# Patient Record
Sex: Male | Born: 1950 | Race: Black or African American | Hispanic: No | Smoking: Never smoker
Health system: Southern US, Community
[De-identification: ages and names within clinical notes are randomized; demographics above are authoritative.]

## PROBLEM LIST (undated history)

## (undated) DIAGNOSIS — I609 Nontraumatic subarachnoid hemorrhage, unspecified: Secondary | ICD-10-CM

## (undated) DIAGNOSIS — E119 Type 2 diabetes mellitus without complications: Secondary | ICD-10-CM

## (undated) DIAGNOSIS — I5022 Chronic systolic (congestive) heart failure: Secondary | ICD-10-CM

## (undated) DIAGNOSIS — I5032 Chronic diastolic (congestive) heart failure: Secondary | ICD-10-CM

## (undated) DIAGNOSIS — I420 Dilated cardiomyopathy: Principal | ICD-10-CM

## (undated) DIAGNOSIS — M1990 Unspecified osteoarthritis, unspecified site: Secondary | ICD-10-CM

## (undated) DIAGNOSIS — I10 Essential (primary) hypertension: Secondary | ICD-10-CM

## (undated) DIAGNOSIS — E785 Hyperlipidemia, unspecified: Secondary | ICD-10-CM

## (undated) DIAGNOSIS — I2510 Atherosclerotic heart disease of native coronary artery without angina pectoris: Secondary | ICD-10-CM

## (undated) HISTORY — DX: Chronic systolic (congestive) heart failure: I50.22

## (undated) HISTORY — DX: Dilated cardiomyopathy: I42.0

## (undated) HISTORY — DX: Nontraumatic subarachnoid hemorrhage, unspecified: I60.9

## (undated) HISTORY — DX: Hyperlipidemia, unspecified: E78.5

## (undated) HISTORY — DX: Type 2 diabetes mellitus without complications: E11.9

## (undated) HISTORY — DX: Chronic diastolic (congestive) heart failure: I50.32

## (undated) HISTORY — DX: Essential (primary) hypertension: I10

## (undated) HISTORY — PX: CORONARY ANGIOPLASTY: SHX604

---
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
## 1996-04-08 DIAGNOSIS — I609 Nontraumatic subarachnoid hemorrhage, unspecified: Secondary | ICD-10-CM

## 1996-04-08 HISTORY — DX: Nontraumatic subarachnoid hemorrhage, unspecified: I60.9

## 1996-04-08 HISTORY — PX: CSF SHUNT: SHX92

## 1996-04-08 HISTORY — PX: BRAIN SURGERY: SHX531

## 1998-09-11 ENCOUNTER — Encounter: Payer: Self-pay | Admitting: Internal Medicine

## 1998-09-11 ENCOUNTER — Ambulatory Visit (HOSPITAL_COMMUNITY): Admission: RE | Admit: 1998-09-11 | Discharge: 1998-09-11 | Payer: Self-pay | Admitting: Internal Medicine

## 2004-11-05 ENCOUNTER — Ambulatory Visit: Payer: Self-pay | Admitting: Internal Medicine

## 2005-02-19 ENCOUNTER — Ambulatory Visit: Payer: Self-pay | Admitting: Internal Medicine

## 2005-03-11 ENCOUNTER — Ambulatory Visit: Payer: Self-pay | Admitting: Gastroenterology

## 2005-04-22 ENCOUNTER — Ambulatory Visit: Payer: Self-pay | Admitting: Gastroenterology

## 2005-04-29 ENCOUNTER — Ambulatory Visit: Payer: Self-pay | Admitting: Gastroenterology

## 2005-04-29 ENCOUNTER — Encounter (INDEPENDENT_AMBULATORY_CARE_PROVIDER_SITE_OTHER): Payer: Self-pay | Admitting: *Deleted

## 2005-08-21 ENCOUNTER — Ambulatory Visit: Payer: Self-pay | Admitting: Internal Medicine

## 2005-10-21 ENCOUNTER — Ambulatory Visit: Payer: Self-pay | Admitting: Internal Medicine

## 2007-01-12 ENCOUNTER — Ambulatory Visit: Payer: Self-pay | Admitting: Internal Medicine

## 2007-10-28 ENCOUNTER — Telehealth: Payer: Self-pay | Admitting: Internal Medicine

## 2007-10-28 ENCOUNTER — Ambulatory Visit: Payer: Self-pay | Admitting: Internal Medicine

## 2007-10-28 DIAGNOSIS — E785 Hyperlipidemia, unspecified: Secondary | ICD-10-CM

## 2007-10-28 DIAGNOSIS — E119 Type 2 diabetes mellitus without complications: Secondary | ICD-10-CM | POA: Insufficient documentation

## 2007-10-28 DIAGNOSIS — M7520 Bicipital tendinitis, unspecified shoulder: Secondary | ICD-10-CM | POA: Insufficient documentation

## 2007-10-28 DIAGNOSIS — I10 Essential (primary) hypertension: Secondary | ICD-10-CM

## 2007-10-28 DIAGNOSIS — R079 Chest pain, unspecified: Secondary | ICD-10-CM | POA: Insufficient documentation

## 2007-10-29 ENCOUNTER — Encounter: Payer: Self-pay | Admitting: Internal Medicine

## 2007-12-30 ENCOUNTER — Encounter: Payer: Self-pay | Admitting: Internal Medicine

## 2008-07-13 ENCOUNTER — Ambulatory Visit: Payer: Self-pay | Admitting: Internal Medicine

## 2008-07-13 ENCOUNTER — Inpatient Hospital Stay (HOSPITAL_COMMUNITY): Admission: AD | Admit: 2008-07-13 | Discharge: 2008-07-14 | Payer: Self-pay | Admitting: Internal Medicine

## 2008-07-13 ENCOUNTER — Encounter: Payer: Self-pay | Admitting: Internal Medicine

## 2008-07-13 ENCOUNTER — Ambulatory Visit: Payer: Self-pay | Admitting: Vascular Surgery

## 2008-07-13 DIAGNOSIS — R209 Unspecified disturbances of skin sensation: Secondary | ICD-10-CM

## 2008-07-26 ENCOUNTER — Ambulatory Visit: Payer: Self-pay | Admitting: Internal Medicine

## 2008-08-05 ENCOUNTER — Telehealth: Payer: Self-pay | Admitting: Internal Medicine

## 2008-11-03 ENCOUNTER — Encounter: Payer: Self-pay | Admitting: Internal Medicine

## 2009-09-17 ENCOUNTER — Encounter: Payer: Self-pay | Admitting: Internal Medicine

## 2009-11-03 ENCOUNTER — Telehealth: Payer: Self-pay | Admitting: Internal Medicine

## 2009-11-17 ENCOUNTER — Ambulatory Visit: Payer: Self-pay | Admitting: Internal Medicine

## 2009-11-17 LAB — CONVERTED CEMR LAB
ALT: 43 units/L (ref 0–53)
AST: 29 units/L (ref 0–37)
Albumin: 4.7 g/dL (ref 3.5–5.2)
Alkaline Phosphatase: 56 units/L (ref 39–117)
BUN: 11 mg/dL (ref 6–23)
Bilirubin, Direct: 0.1 mg/dL (ref 0.0–0.3)
CO2: 30 meq/L (ref 19–32)
Calcium: 9.9 mg/dL (ref 8.4–10.5)
Chloride: 103 meq/L (ref 96–112)
Cholesterol: 177 mg/dL (ref 0–200)
Creatinine, Ser: 0.9 mg/dL (ref 0.4–1.5)
Folate: 17.7 ng/mL
GFR calc non Af Amer: 107.58 mL/min (ref >60)
Glucose, Bld: 240 mg/dL — ABNORMAL HIGH (ref 70–99)
HDL: 33.6 mg/dL — ABNORMAL LOW (ref >39.00)
Hgb A1c MFr Bld: 9.9 % — ABNORMAL HIGH (ref 4.6–6.5)
LDL Cholesterol: 109 mg/dL — ABNORMAL HIGH (ref 0–99)
Potassium: 4.2 meq/L (ref 3.5–5.1)
Sodium: 141 meq/L (ref 135–145)
Total Bilirubin: 0.7 mg/dL (ref 0.3–1.2)
Total CHOL/HDL Ratio: 5
Total Protein: 7.7 g/dL (ref 6.0–8.3)
Triglycerides: 171 mg/dL — ABNORMAL HIGH (ref 0.0–149.0)
VLDL: 34.2 mg/dL (ref 0.0–40.0)
Vitamin B-12: 739 pg/mL (ref 211–911)

## 2009-11-20 ENCOUNTER — Telehealth: Payer: Self-pay | Admitting: Internal Medicine

## 2009-11-22 ENCOUNTER — Encounter: Payer: Self-pay | Admitting: Internal Medicine

## 2010-01-11 ENCOUNTER — Telehealth (INDEPENDENT_AMBULATORY_CARE_PROVIDER_SITE_OTHER): Payer: Self-pay | Admitting: *Deleted

## 2010-03-20 ENCOUNTER — Encounter: Payer: Self-pay | Admitting: Gastroenterology

## 2010-03-29 ENCOUNTER — Ambulatory Visit: Payer: Self-pay | Admitting: Internal Medicine

## 2010-05-09 NOTE — Progress Notes (Signed)
Summary: REFILS  Phone Note Call from Patient Call back at [facility] Phone [phone]   Caller: Patient Summary of Call: PT IS REQUESTING REFILLS LISINOPRIL 20 AND VYTORIN 10-80.  THE PHARMACY REFILLED THE METFORMIN.  HE MADE AN APPT FOR AUG 12 AT 2:20 FOR A YEARLY MEDICARE ROV. Initial call taken by: Hilarie Fredrickson,  November 03, 2009 11:01 AM    Prescriptions: VYTORIN 10-80 MG  TABS (EZETIMIBE-SIMVASTATIN) 1 qd  #30 Tablet x 0   Entered by:   Bill Salinas CMA   Authorized by:   Jacques Navy MD   Signed by:   Bill Salinas CMA on 11/03/2009   Method used:   Electronically to        [facility] [phone]* (retail)       [address]       [place], [place]  [address]       Ph: [phone] or [phone]       Fax: [phone]   RxID:   [id number] LISINOPRIL 20 MG  TABS (LISINOPRIL) Take 1 tablet by mouth two times a day  #60 Tablet x 0   Entered by:   Ami Bullins CMA   Authorized by:   Jacques Navy MD   Signed by:   Bill Salinas CMA on 11/03/2009   Method used:   Electronically to        [facility] [phone]* (retail)       [address]       [place], [place]  [address]       Ph: [phone] or [phone]       Fax: [phone]   RxID:   [id number]

## 2010-05-09 NOTE — Progress Notes (Signed)
    Preventive Care Screening  Colonoscopy:    Date:  04/29/2005    Next Due:  05/2010    Results:  Adenomatous Polyp    Immunization History:  Influenza Immunization History:    Influenza:  historical (01/12/2007)

## 2010-05-09 NOTE — Medication Information (Signed)
Summary: Diabetes Testing [facility]  [facility]   Imported By: Sherian Rein 09/25/2009 09:01:51  _____________________________________________________________________  External Attachment:    Type:   Image     Comment:   External Document

## 2010-05-09 NOTE — Assessment & Plan Note (Signed)
Summary: YEARLY FU / REFILLS/MEDICARE/ LABS NEXT DAY/ NWS   Vital Signs:  Patient profile:   60 year old male Height:      69 inches (175.26 cm) Weight:      207.25 pounds (94.20 kg) BMI:     30.72 O2 Sat:      98 % on Room air Temp:     97.2 degrees F (36.22 degrees C) oral Pulse rate:   85 / minute BP sitting:   140 / 94  (left arm) Cuff size:   regular  Vitals Entered By: Lucious Groves CMA (November 17, 2009 2:44 PM)  O2 Flow:  Room air CC: Yearly f/u and med refills./kb Is Patient Diabetic? Yes Pain Assessment Patient in pain? no        Primary Care Provider:  Jacques Navy MD  CC:  Yearly f/u and med refills./kb.  History of Present Illness: Patient presents for routine medical follow-up. In the interval he has been doing well: no major illness, no injury no surgery. He does report that he has had some tingling in the right distal LE for several months.   Preventive Screening-Counseling & Management  Alcohol-Tobacco     Alcohol drinks/day: 0     Smoking Status: never  Caffeine-Diet-Exercise     Diet Comments: healthy diet      Does Patient Exercise: yes  Hep-HIV-STD-Contraception     STD Risk: risk noted     STD Risk Counseling: to avoid increased STD risk  Safety-Violence-Falls     Seat Belt Use: yes     Smoke Detectors: yes  Current Medications (verified): 1)  Vytorin 10-80 Mg  Tabs (Ezetimibe-Simvastatin) .Marland Kitchen.. 1 Qd 2)  Lisinopril 20 Mg  Tabs (Lisinopril) .... Take 1 Tablet By Mouth Two Times A Day 3)  Metformin Hcl 500 Mg  Tabs (Metformin Hcl) .Marland Kitchen.. 1 By Mouth Once Daily 4)  Aspirin 325 Mg Tabs (Aspirin) .... Take 1 Tab By Mouth Every Day 5)  Mvi .Marland Kitchen.. 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 He did live in [place].  Lives together with his son. Sexually active -  Never Smoked Alcohol use-no Does Patient Exercise:  yes STD Risk:  risk noted Seat Belt Use:  yes  Review of Systems  The patient denies anorexia, fever, weight loss, weight gain, vision loss, decreased hearing, chest pain, dyspnea on exertion, peripheral edema, abdominal pain, severe indigestion/heartburn, muscle weakness, difficulty walking, abnormal bleeding, and angioedema.         nocturia x 1  Physical Exam  General:  Well-developed,well-nourished,in no acute distress; alert,appropriate and cooperative throughout examination Head:  normocephalic, atraumatic, and no abnormalities observed.   Eyes:  vision grossly intact, pupils equal, pupils round, and corneas and lenses clear.   Ears:  External ear exam shows no significant lesions or deformities.  Otoscopic examination reveals clear canals, tympanic membranes are intact bilaterally without bulging, retraction, inflammation or discharge. Hearing is  grossly normal bilaterally. Nose:  no external deformity and no external erythema.   Mouth:  Oral mucosa and oropharynx without lesions or exudates.  Teeth in good repair. Neck:  supple, full ROM, no thyromegaly, and no carotid bruits.   Chest Wall:  No deformities, masses, tenderness or gynecomastia noted. Lungs:  Normal respiratory effort, chest expands symmetrically. Lungs are clear to auscultation, no crackles or wheezes. Heart:  Normal rate and regular rhythm. S1 and S2 normal without gallop, murmur, click, rub or other extra  sounds. Abdomen:  soft, non-tender, normal bowel sounds, no guarding, no rigidity, and no hepatomegaly.   Rectal:  No external abnormalities noted. Normal sphincter tone. No rectal masses or tenderness. Prostate:  Prostate gland firm and smooth, no enlargement, nodularity, tenderness, mass, asymmetry or induration. Msk:  normal ROM, no joint tenderness, no joint swelling, no joint warmth, and no joint instability.   Pulses:  2+ radial and DP Extremities:  No clubbing, cyanosis, edema, or deformity noted with normal full range of motion of all joints.    Diabetes Management Exam:    Foot Exam (with socks and/or shoes not present):       Sensory-Pinprick/Light touch:          Left medial foot (L-4): normal          Left dorsal foot (L-5): normal          Left lateral foot (S-1): normal          Right medial foot (L-4): normal          Right dorsal foot (L-5): normal          Right lateral foot (S-1): normal       Sensory-Monofilament:          Left foot: normal          Right foot: normal       Sensory-other: vibratory sensation       Inspection:          Left foot: normal          Right foot: normal       Nails:          Left foot: normal          Right foot: normal    Eye Exam:       Eye Exam done elsewhere          Date: 11/15/2009          Results: normal          Done by: Dr. Dione Booze   Impression & Recommendations:  Problem # 1:  HYPERTENSION (ICD-401.9)  His updated medication list for this problem includes:    Lisinopril 20 Mg Tabs (Lisinopril) .Marland Kitchen... Take 1 tablet by mouth two times a day  Orders: TLB-BMP (Basic Metabolic Panel-BMET) (80048-METABOL)  BP today: 140/94 Prior BP: 140/100 (07/26/2008)  Borderline control on lisinopril 20.  Plan - he is to monitor his blood pressure out of the office and report back. Goal is systolic of 130, diastolic of 80  Problem # 2:  DIABETES MELLITUS, TYPE II (ICD-250.00) Due for A1C with recommendations to follow.   His  updated medication list for this problem includes:    Lisinopril 20 Mg Tabs (Lisinopril) .Marland Kitchen... Take 1 tablet by mouth two times a day    Metformin Hcl 500 Mg Tabs (Metformin hcl) .Marland Kitchen... 1 by mouth once daily    Aspirin 325 Mg Tabs (Aspirin) .Marland Kitchen... Take 1 tab by mouth  every day  Orders: TLB-A1C / Hgb A1C (Glycohemoglobin) (83036-A1C)  Addendum - A1C 9.9%  Plan - follow-up OV  Problem # 3:  HYPERLIPIDEMIA (ICD-272.4) Due for labs with recommendations to follow.  His updated medication list for this problem includes:    Vytorin 10-80 Mg Tabs (Ezetimibe-simvastatin) .Marland Kitchen... 1 qd  Orders: TLB-Lipid Panel (80061-LIPID) TLB-Hepatic/Liver Function Pnl (80076-HEPATIC)  Addendum - LDL 109, close to goal of 401 or less. Will continue present medications with increased emphasis on healthy diet.   Problem # 4:  Preventive Health Care (ICD-V70.0) Unremarkable history and normal physical exam. His labs with elevated A!c but otherwise OK. He is current with tetnus. He is due to schedule colonoscopy. He needs pneumonia vaccine and should consider shingles vaccine. Will discuss at this return visit. He does need a return visit due to diabetes being out of control.   Complete Medication List: 1)  Vytorin 10-80 Mg Tabs (Ezetimibe-simvastatin) .Marland Kitchen.. 1 qd 2)  Lisinopril 20 Mg Tabs (Lisinopril) .... Take 1 tablet by mouth two times a day 3)  Metformin Hcl 500 Mg Tabs (Metformin hcl) .Marland Kitchen.. 1 by mouth once daily 4)  Aspirin 325 Mg Tabs (Aspirin) .... Take 1 tab by mouth every day 5)  Mvi  .Marland Kitchen.. 1 by mouth qd  Other Orders: TLB-B12 + Folate Pnl (82746_82607-B12/FOL) TD Toxoids IM 7 YR + (02725) Admin 1st Vaccine (36644)  Patient: Jesse Woods Note: All result statuses are Final unless otherwise noted.  Tests: (1) B12 + Folate Panel (B12/FOL)   Vitamin B12               739 pg/mL                   211-911   Folate                    17.7 ng/mL     Deficient  0.4 - 3.4 ng/mL     Indeterminate  3.4 - 5.4  ng/mL     Normal  >5.4 ng/mL  Tests: (2) BMP (METABOL)   Sodium                    141 mEq/L                   135-145   Potassium                 4.2 mEq/L                   3.5-5.1   Chloride                  103 mEq/L                   96-112   Carbon Dioxide            30 mEq/L                    19-32   Glucose              [H]  240 mg/dL                   03-47   BUN                       11 mg/dL  6-23   Creatinine                0.9 mg/dL                   5.2-8.4   Calcium                   9.9 mg/dL                   1.3-24.4   GFR                       107.58 mL/min               >60  Tests: (3) Hemoglobin A1C (A1C)   Hemoglobin A1C       [H]  9.9 %                       4.6-6.5     Glycemic Control Guidelines for People with Diabetes:     Non Diabetic:  <6%     Goal of Therapy: <7%     Additional Action Suggested:  >8%   Tests: (4) Lipid Panel (LIPID)   Cholesterol               177 mg/dL                   0-102     ATP III Classification            Desirable:  < 200 mg/dL                    Borderline High:  200 - 239 mg/dL               High:  > = 240 mg/dL   Triglycerides        [H]  171.0 mg/dL                 7.2-536.6     Normal:  <150 mg/dL     Borderline High:  440 - 199 mg/dL   HDL                  [L]  34.74 mg/dL                 >25.95   VLDL Cholesterol          34.2 mg/dL                  6.3-87.5   LDL Cholesterol      [H]  643 mg/dL                   3-29  CHO/HDL Ratio:  CHD Risk                             5                    Men          Women     1/2 Average Risk     3.4          3.3     Average Risk          5.0          4.4     2X Average Risk  9.6          7.1     3X Average Risk          15.0          11.0                           Tests: (5) Hepatic/Liver Function Panel (HEPATIC)   Total Bilirubin           0.7 mg/dL                   6.6-4.4   Direct Bilirubin          0.1 mg/dL                   0.3-4.7    Alkaline Phosphatase      56 U/L                      39-117   AST                       29 U/L                      0-37   ALT                       43 U/L                      0-53   Total Protein             7.7 g/dL                    4.2-5.9   Albumin                   4.7 g/dL                    5.6-3.8VFIEPPIRJJOAC: METFORMIN HCL 500 MG  TABS (METFORMIN HCL) 1 by mouth once daily  #30 x 12   Entered and Authorized by:   Jacques Navy MD   Signed by:   Jacques Navy MD on 11/17/2009   Method used:   Electronically to        [facility] [phone]* (retail)       [address]       [place], [place]  [address]       Ph: [phone] or [phone]       Fax: [phone]   RxID:   [id number] LISINOPRIL 20 MG  TABS (LISINOPRIL) Take 1 tablet by mouth two times a day  #60 Tablet x 12   Entered and Authorized by:   Jacques Navy MD   Signed by:   Jacques Navy MD on 11/17/2009   Method used:   Electronically to        [facility] [phone]* (retail)       [address]       [place], [place]  [address]       Ph: [phone] or [phone]       Fax: [phone]   RxID:   [id number] VYTORIN 10-80 MG  TABS (EZETIMIBE-SIMVASTATIN) 1 qd  #30 Tablet x 12  Entered and Authorized by:   Jacques Navy MD   Signed by:   Jacques Navy MD on 11/17/2009   Method used:   Electronically to        [facility] [phone]* (retail)       [address]       [place], [place]  [address]       Ph: [phone] or [phone]       Fax: [phone]   RxID:   [id number]    Immunizations Administered:  Tetanus Vaccine:    Vaccine Type: Td    Site: right deltoid    Mfr: GlaxoSmithKline    Dose: 0.5 ml    Route: IM    Given by: Lucious Groves CMA    Exp. Date: 10/06/2011    Lot #: [id number]    VIS given: 02/24/07 version given November 17, 2009.

## 2010-05-09 NOTE — Miscellaneous (Signed)
Summary: Flu Vaccination/[facility]  Flu Vaccination/[facility]   Imported By: Sherian Rein 12/01/2009 11:19:41  _____________________________________________________________________  External Attachment:    Type:   Image     Comment:   External Document

## 2010-05-09 NOTE — Progress Notes (Signed)
  Phone Note Other Incoming   Request: Send information Summary of Call: Request for records received from [facility] [employer] Request forwarded to [facility].

## 2010-05-10 NOTE — Assessment & Plan Note (Signed)
Summary: chills,throat scratchy,headache/lb   Vital Signs:  Patient profile:   59 year old male Height:      69 inches Weight:      207 pounds BMI:     30.68 O2 Sat:      97 % on Room air Temp:     97.8 degrees F oral Pulse rate:   89 / minute BP sitting:   138 / 88  (left arm) Cuff size:   regular  Vitals Entered By: Bill Salinas CMA (March 29, 2010 3:37 PM)  O2 Flow:  Room air CC: sore throat x about 1 week/ ab   Primary Care Provider:  Jacques Navy MD  CC:  sore throat x about 1 week/ ab.  History of Present Illness: patient has been sick sinc Dec 5th with cold symptoms which he treated with coricidin for cold. This helped and he was feelng better. He started feeling worse again 48 hrs ago. No fever, no chills, no cough. Sratchy throat, chilly in the chest area. No N/V/D.  He has been out and about. No known sick contact.   Current Medications (verified): 1)  Vytorin 10-80 Mg  Tabs (Ezetimibe-Simvastatin) .Marland Kitchen.. 1 Qd 2)  Lisinopril 20 Mg  Tabs (Lisinopril) .... Take 1 Tablet By Mouth Two Times A Day 3)  Metformin Hcl 500 Mg  Tabs (Metformin Hcl) .Marland Kitchen.. 1 By Mouth Once Daily 4)  Aspirin 325 Mg Tabs (Aspirin) .... Take 1 Tab By Mouth Every Day 5)  Mvi .Marland Kitchen.. 1 By Mouth Qd  Allergies (verified): No Known Drug Allergies  Past History:  Past Medical History: Last updated: 07/13/2008 Diabetes mellitus, type II Hyperlipidemia Hypertension  Past Surgical History: Last updated: 07/13/2008 R subarachn hemorrage "51 Dr Lovell Sheehan FH reviewed for relevance, SH/Risk Factors reviewed for relevance  Review of Systems  The patient denies anorexia, fever, weight loss, weight gain, chest pain, syncope, dyspnea on exertion, prolonged cough, headaches, abdominal pain, severe indigestion/heartburn, muscle weakness, difficulty walking, depression, and enlarged lymph nodes.    Physical Exam  General:  Well-developed,well-nourished,in no acute distress; alert,appropriate and  cooperative throughout examination Head:  Normocephalic and atraumatic without obvious abnormalities. No tenderness to percussion over frontal or maxillary sinus Eyes:  pupils equal and pupils round.  C&S clear Ears:  EACs/TMs normal Mouth:  Throat clear Neck:  supple, full ROM, and no thyromegaly.   Lungs:  normal respiratory effort, normal breath sounds, no crackles, and no wheezes.   Heart:  normal rate, regular rhythm, and no murmur.   Abdomen:  soft and normal bowel sounds.   Msk:  normal ROM, no joint tenderness, no joint swelling, and no joint warmth.   Neurologic:  alert & oriented X3, cranial nerves II-XII intact, and gait normal.   Skin:  turgor normal and color normal.   Psych:  normally interactive and good eye contact.     Impression & Recommendations:  Problem # 1:  VIRAL URI (ICD-465.9) no evidence of bacterial infection.  Plan - routine supportive care: vit c, hydrate, otc product of choice, APAP 1000mg  three times a day.          call for fever, shortness of breath, productive cough.  His updated medication list for this problem includes:    Aspirin 325 Mg Tabs (Aspirin) .Marland Kitchen... Take 1 tab by mouth every day  Complete Medication List: 1)  Vytorin 10-80 Mg Tabs (Ezetimibe-simvastatin) .Marland Kitchen.. 1 qd 2)  Lisinopril 20 Mg Tabs (Lisinopril) .... Take 1 tablet by mouth two times  a day 3)  Metformin Hcl 500 Mg Tabs (Metformin hcl) .Marland Kitchen.. 1 by mouth once daily 4)  Aspirin 325 Mg Tabs (Aspirin) .... Take 1 tab by mouth every day 5)  Mvi  .Marland Kitchen.. 1 by mouth qd   Orders Added: 1)  Est. Patient Level III [56213]

## 2010-05-10 NOTE — Letter (Signed)
Summary: Colonoscopy Letter  [facility]  [address]   [place], [place] [address]   Phone: [phone]  Fax: [phone]      March 20, 2010 MRN: [id number]   Jesse Woods [address] [place], [place]  [address]   Dear [name],   According to your medical record, it is time for you to schedule a Colonoscopy. The American Cancer Society recommends this procedure as a method to detect early colon cancer. Patients with a family history of colon cancer, or a personal history of colon polyps or inflammatory bowel disease are at increased risk.  This letter has been generated based on the recommendations made at the time of your procedure. If you feel that in your particular situation this may no longer apply, please contact our office.  Please call our office at [phone] to schedule this appointment or to update your records at your earliest convenience.  Thank you for cooperating with [place] to provide you with the very best care possible.   Sincerely,   Claudette Head, M.D.  [facility] [phone]

## 2010-07-18 LAB — COMPREHENSIVE METABOLIC PANEL
ALT: 19 U/L (ref 0–53)
Albumin: 4.1 g/dL (ref 3.5–5.2)
Alkaline Phosphatase: 53 U/L (ref 39–117)
Calcium: 9.2 mg/dL (ref 8.4–10.5)
Potassium: 4.1 mEq/L (ref 3.5–5.1)
Sodium: 138 mEq/L (ref 135–145)
Total Protein: 7.1 g/dL (ref 6.0–8.3)

## 2010-07-18 LAB — CBC
HCT: 42.1 % (ref 39.0–52.0)
Hemoglobin: 14.1 g/dL (ref 13.0–17.0)
MCHC: 33.4 g/dL (ref 30.0–36.0)
MCV: 86.7 fL (ref 78.0–100.0)
Platelets: 219 K/uL (ref 150–400)
RBC: 4.86 MIL/uL (ref 4.22–5.81)
RDW: 12.5 % (ref 11.5–15.5)
WBC: 4.7 K/uL (ref 4.0–10.5)

## 2010-07-18 LAB — VITAMIN B12: Vitamin B-12: 365 pg/mL (ref 211–911)

## 2010-07-18 LAB — TSH: TSH: 1.216 u[IU]/mL (ref 0.350–4.500)

## 2010-07-18 LAB — HEMOGLOBIN A1C: Hgb A1c MFr Bld: 7.5 % — ABNORMAL HIGH (ref 4.6–6.1)

## 2010-08-21 NOTE — Discharge Summary (Signed)
NAME:  Jesse Woods, SASSONE NO.:  [id number]   MEDICAL RECORD NO.:  [id number]          PATIENT TYPE:  INP   LOCATION:  1408                         FACILITY:  [facility]   PHYSICIAN:  Rosalyn Gess. Norins, MD  DATE OF BIRTH:  June 29, 1950   DATE OF ADMISSION:  07/13/2008  DATE OF DISCHARGE:  07/14/2008                               DISCHARGE SUMMARY   The patient admitted at 12:31 p.m. on July 13, 2008 and discharged at  12:00 p.m. on July 14, 2008.   ADMITTING DIAGNOSES:  1. Facial numbness, rule out transient ischemic attack.  2. Hypertension.  3. Diabetes mellitus.  4. Hyperlipidemia.   DISCHARGE DIAGNOSES:  1. Facial numbness, rule out transient ischemic attack.  2. Hypertension.  3. Diabetes mellitus.  4. Hyperlipidemia.   CONSULTANTS:  None.   PROCEDURES:  1. 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  It showed some punctate white matter changes.  2. Carotid Dopplers which showed no ICA stenosis.   HISTORY OF PRESENT ILLNESS:  The patient is a 60 year old African-  American gentleman followed for diabetes, hypertension, hyperlipidemia  who presented to the office on the day of admission complaining of  numbness in the left face that started approximately 1 week ago and is  intermittent.  He also complained of some intermittent paresthesias of  his left arm.  On the day of admission, he also was complaining of a  slight headache.  The patient's blood pressures by his report at home  have been in the 130/90 range, but in the office was 156/100.  He denied  any chest pain, shortness of breath or other significant symptoms.  He  was subsequently admitted to rule out acute central  nervous system  event.  Please see the EMR H&P for past medical history, family history,  social history and examination.   HOSPITAL COURSE:  Neurologic:  The patient was admitted to rule out  TIA/CVA.  His MRI was negative.  His carotid  Dopplers were normal.  The  patient at this time complains of resolution of any facial numbness and  tingling, and he actually denies any focal neurologic symptoms.   With the patient having a negative evaluation with negative MRI,  negative carotid Dopplers and resolution of his symptoms, he is felt to  be stable and ready for discharge home for outpatient follow up.   The patient will have flexion/extension cervical spine films prior to  discharge from hospital for review as an outpatient to rule out possible  nerve compression syndrome causing his intermittent paresthesias in his  left arm.   DISCHARGE EXAMINATION:  VITAL SIGNS:  Temperature was 97.4, blood  pressure 134/96, heart rate 63, respirations 18, O2 saturations 98% on  room air.  GENERAL APPEARANCE:  This is a heavy-set African-American gentleman who  is in no acute distress, cooperative with the exam.  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  Conjunctiva and sclerae clear.  CHEST:  Patient is moving air  well with no increased work of breathing.  No wheezing.  CARDIOVASCULAR:  2+ radial pulse.  He had a regular rate and rhythm  without murmurs, rubs or gallops.  ABDOMEN:  Obese, soft.  No guarding or rebound.  EXTREMITIES:  Without deformity.  NEUROLOGIC:  The patient is awake, alert.  He is oriented to person,  place, time and context.  Speech is clear and intelligible.  Cognitive  function is normal.  Cranial nerves exam - the patient has normal facial  symmetry and muscle movement with normal wrinkling of his brow.  He had  no facial droop.  No deviation or abnormal movements of the tongue.  Extraocular muscles were intact.  Pupils were equal, round.  Motor  strength was 5/5 throughout with no focal deficits.  Cerebellar function  - the patient with no tremor, no cogwheeling or other abnormality.   FINAL LABORATORY:  At admission, hemoglobin was 14.4 grams, white count   4700, platelet count 219,000.  Chemistries with a sodium of 138,  potassium 4.1, chloride 105, CO2 of 26, BUN 8, creatinine 1.0, glucose  147.  LFTs were normal.  Sed rate was 8.  B12 was normal at 365.  TSH  was normal at 1.2.  A1c was slightly elevated at 7.5%.   DISPOSITION:  The patient is discharged home.   DISCHARGE MEDICATIONS:  He will continue on his home medications,  although he will not restart metformin until July 15, 2008.  He will  continue Vytorin 10/80 once daily, lisinopril 20 mg once daily.   FOLLOWUP:  The patient will be seen in the office for follow up in 7-10  days.   CONDITION AT THE TIME OF THIS DISCHARGE DICTATION:  Stable and improved.      Rosalyn Gess Norins, MD  Electronically Signed     MEN/MEDQ  D:  07/14/2008  T:  07/14/2008  Job:  [id number]

## 2010-08-24 NOTE — Assessment & Plan Note (Signed)
[facility]                             PRIMARY CARE OFFICE NOTE   Jesse Woods, Jesse Woods                     MRN:          [id number]  DATE:10/21/2005                            DOB:          16-Jan-1951    DATE OF VISIT:  October 21, 2005.   IDENTIFICATION:  [name] is a 60 year old African-American gentleman who  presents for followup evaluation and exam.  He was last seen in the office  Aug 21, 2005 for tingling in his arm, diagnosed by Dr. Gabriel Rung as possible  carpal tunnel syndrome.  The patient reports his discomfort continues.  It  is worse at night, is proximal in his upper extremities with radiation to  his hand.  He has had no weakness, no neck pain, no strain and no injury.   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  This was secondary to post subarachnoid hemorrhage  following respiratory arrest.  The patient is also followed for  hypertension, hyperlipidemia, diabetes.   CURRENT MEDICATIONS:  1.  Vytorin 10/80 daily.  2.  Lisinopril 20 mg daily.  3.  Metformin 500 mg daily.   FAMILY HISTORY:  Noncontributory.   SOCIAL HISTORY:  The patient is divorced. He has two children.  He is on  full disability.  He denies any use of tobacco or alcohol.   REVIEW OF SYSTEMS:  Negative for any constitutional, cardiovascular,  respiratory, gastrointestinal or genitourinary complaints.  He does note he  seems to have weaker vision.   PHYSICAL EXAMINATION:  VITAL SIGNS:  Temperature 97.7.  Blood pressure  104/78, pulse 90, weight 204.  GENERAL APPEARANCE:  He is a well-nourished, well-developed African-American  gentleman in no acute distress.  HEENT:  Patient has what appears to be an old shunt at the right forehead.  Otherwise, head is atraumatic.  EAC's and TM's were normal.  Oropharynx  without lesions.  Conjunctivae and sclerae clear.  NECK:  Supple without thyromegaly.  No lymphadenopathy  was noted.  CHEST:  No costovertebral angle tenderness.  Lungs were clear to  auscultation on percussion.  CARDIOVASCULAR:  2+ radial pulses, no jugular venous distention or carotid  bruits.  He had a quiet precordium with regular rate and rhythm without  murmurs, rubs or gallops.  ABDOMEN:  Soft, no guarding, no rebound. No organomegaly or splenomegaly was  noted.  Rectal examination was deferred.  EXTREMITIES:  Without cyanosis, clubbing, edema or deformity.  NEUROLOGICAL:  Nonfocal, although formal testing was not performed.   LABORATORY DATA:  Laboratory from Aug 21, 2005 revealed chemistries to have  a glucose of 167, electrolytes were normal.  Kidney function normal with  creatinine 1.1.  Liver function tests were normal.  Cholesterol was 171.  Triglycerides 281.  HDL was 23.4, LDL was 129.9.  Hemoglobin A1C was 6.4%.  TSH was normal at 0.96.  Folate was normal at 9.3.  B12 was normal at 460.   ASSESSMENT/PLAN:  1.  Lipids.  Patient is suboptimally controlled given his diabetes.  Patient      will continue  his Vytorin 10/80 and will add Tricor 145 mg daily.  The      patient will return for lipid panel in three to four weeks.  2.  Hypertension.  The patient's blood pressure is adequately controlled on      his present medications.  He will continue the same.  3.  Diabetes.  The patient's last hemoglobin A1C reflects good control.  He      will continue on his present medical regimen.  4.  Health maintenance.  The patient did have a colonoscopy April 29, 2005      with polyps.  He will be followed up as per gastroenterology protocol.   With the patient being started on a new medication for lipid management, he  will need to return for lipid profile in four to six weeks.                                   Jesse Gess Norins, MD   MEN/MedQ  DD:  10/22/2005  DT:  10/22/2005  Job #:  [id number]   cc:   Patient

## 2010-11-25 ENCOUNTER — Other Ambulatory Visit: Payer: Self-pay | Admitting: Internal Medicine

## 2011-01-03 ENCOUNTER — Ambulatory Visit (INDEPENDENT_AMBULATORY_CARE_PROVIDER_SITE_OTHER): Payer: Medicare Other | Admitting: Internal Medicine

## 2011-01-03 ENCOUNTER — Other Ambulatory Visit: Payer: Self-pay | Admitting: Internal Medicine

## 2011-01-03 ENCOUNTER — Encounter: Payer: Self-pay | Admitting: Internal Medicine

## 2011-01-03 ENCOUNTER — Other Ambulatory Visit (INDEPENDENT_AMBULATORY_CARE_PROVIDER_SITE_OTHER): Payer: Medicare Other

## 2011-01-03 VITALS — BP 138/82 | HR 71 | Temp 97.8°F | Wt 196.0 lb

## 2011-01-03 DIAGNOSIS — Z136 Encounter for screening for cardiovascular disorders: Secondary | ICD-10-CM

## 2011-01-03 DIAGNOSIS — Z0000 Encounter for general adult medical examination without abnormal findings: Secondary | ICD-10-CM

## 2011-01-03 DIAGNOSIS — D126 Benign neoplasm of colon, unspecified: Secondary | ICD-10-CM

## 2011-01-03 DIAGNOSIS — E785 Hyperlipidemia, unspecified: Secondary | ICD-10-CM

## 2011-01-03 DIAGNOSIS — E119 Type 2 diabetes mellitus without complications: Secondary | ICD-10-CM

## 2011-01-03 DIAGNOSIS — Z125 Encounter for screening for malignant neoplasm of prostate: Secondary | ICD-10-CM

## 2011-01-03 DIAGNOSIS — I10 Essential (primary) hypertension: Secondary | ICD-10-CM

## 2011-01-04 LAB — HEPATIC FUNCTION PANEL
ALT: 20 U/L (ref 0–53)
Albumin: 4.3 g/dL (ref 3.5–5.2)
Alkaline Phosphatase: 45 U/L (ref 39–117)
Bilirubin, Direct: 0.1 mg/dL (ref 0.0–0.3)
Total Protein: 7.4 g/dL (ref 6.0–8.3)

## 2011-01-04 LAB — COMPREHENSIVE METABOLIC PANEL
AST: 26 U/L (ref 0–37)
Albumin: 4.3 g/dL (ref 3.5–5.2)
Alkaline Phosphatase: 45 U/L (ref 39–117)
BUN: 18 mg/dL (ref 6–23)
Creatinine, Ser: 1 mg/dL (ref 0.4–1.5)
Glucose, Bld: 154 mg/dL — ABNORMAL HIGH (ref 70–99)
Potassium: 4.4 mEq/L (ref 3.5–5.1)
Total Bilirubin: 0.7 mg/dL (ref 0.3–1.2)

## 2011-01-04 LAB — LDL CHOLESTEROL, DIRECT: Direct LDL: 215.3 mg/dL

## 2011-01-04 LAB — PSA: PSA: 1.21 ng/mL (ref 0.10–4.00)

## 2011-01-04 LAB — LIPID PANEL
Cholesterol: 289 mg/dL — ABNORMAL HIGH (ref 0–200)
VLDL: 35.2 mg/dL (ref 0.0–40.0)

## 2011-01-05 ENCOUNTER — Encounter: Payer: Self-pay | Admitting: Internal Medicine

## 2011-01-05 DIAGNOSIS — Z0000 Encounter for general adult medical examination without abnormal findings: Secondary | ICD-10-CM | POA: Insufficient documentation

## 2011-01-05 NOTE — Assessment & Plan Note (Signed)
Patient is due for follow-up colonoscopy.  Plan - referral to Dr. Russella Dar.

## 2011-01-05 NOTE — Progress Notes (Signed)
Subjective:    Patient ID: Jesse Woods, male    DOB: 12-07-1950, 61 y.o.   MRN: [id number]  HPI The patient is here for annual Medicare wellness examination and management of other chronic and acute problems. He does report that he is having some back pain worse on the day after he exercises.   The risk factors are reflected in the social history.  The roster of all physicians providing medical care to patient - is listed in the Snapshot section of the chart.  Activities of daily living:  The patient is 100% inedpendent in all ADLs: dressing, toileting, feeding as well as independent mobility  Home safety : The patient has smoke detectors in the home. They wear seatbelts.  There is no risks for hepatitis, STDs or HIV. There is no   history of blood transfusion. They have no travel history to infectious disease endemic areas of the world.  The patient has seen their dentist in the last six month. They have seen their eye doctor in the last year. They deny any hearing difficulty and have not had audiologic testing in the last year.  They do not  have excessive sun exposure. Discussed the need for sun protection: hats, long sleeves and use of sunscreen if there is significant sun exposure.   Diet: the importance of a healthy diet is discussed. They do take most of their meals out. Discussed the ability to make healthy choices even when eating out.  The patient has a regular exercise program: walks twice a day, plays full court basketball every week, plays volleyball at church.  The benefits of regular aerobic exercise were discussed.  Depression screen: there are no signs or vegative symptoms of depression- irritability, change in appetite, anhedonia, sadness/tearfullness.  Cognitive assessment: the patient manages all their financial and personal affairs and is actively engaged. They could relate day,date,year and events.  The following portions of the patient's history were reviewed  and updated as appropriate: allergies, current medications, past family history, past medical history,  past surgical history, past social history  and problem list.  Vision, hearing, body mass index were assessed and reviewed.   During the course of the visit the patient was educated and counseled about appropriate screening and preventive services including : fall prevention , diabetes screening, nutrition counseling, colorectal cancer screening, and recommended immunizations.  Past Medical History  Diagnosis Date  . Diabetes mellitus, type II   . Hyperlipidemia   . Hypertension    Past Surgical History  Procedure Date  . Brain surgery 1998    right frontal ventriculotomy with subsequent ventriculo-abfdominal shunt due to [facility]   Family History  Problem Relation Age of Onset  . Cancer Mother     pancreatic  . Diabetes Father   . Hypertension Father   . Cancer Father     prostate cancer  . Cancer Brother     prostate cancer - cause of death  . Cancer Cousin     prostate cancer   History   Social History  . Marital Status: Divorced    Spouse Name: N/A    Number of Children: N/A  . Years of Education: N/A   Occupational History  . Not on file.   Social History Main Topics  . Smoking status: Never Smoker   . Smokeless tobacco: Never Used  . Alcohol Use: No  . Drug Use: No  . Sexually Active: Yes -- Male partner(s)   Other Topics Concern  . 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 Married- '72-'86; remarried '86-'90, single. 1 son- '73, 1 daughter-'79; 2 grandchildren. Retired after [employer], worked for [employer] before retirement. Had served in [employer] force. He did live in [place]. Lives together with his son. Very active in his church        Review of Systems Constitutional:  Negative for fever, chills, activity change and unexpected weight change.  HEENT:  Negative for hearing loss, ear  pain, congestion, neck stiffness and postnasal drip. Negative for sore throat or swallowing problems. Negative for dental complaints.   Eyes: Negative for vision loss or change in visual acuity.  Respiratory: Negative for chest tightness and wheezing. Negative for DOE.   Cardiovascular: Negative for chest pain or palpitations. No decreased exercise tolerance Gastrointestinal: No change in bowel habit. No bloating or gas. No reflux or indigestion Genitourinary: Negative for urgency, frequency, flank pain and difficulty urinating.  Musculoskeletal: Negative for myalgias, back pain, arthralgias and gait problem.  Neurological: Negative for dizziness, tremors, weakness and headaches.  Hematological: Negative for adenopathy.  Psychiatric/Behavioral: Negative for behavioral problems and dysphoric mood.       Objective:   Physical Exam Vital signs reviewed-stable BP Gen'l: Well nourished well developed AA male in no acute distress  HEENT: Head: Visible and palpable shunt right scalp/neck. Very well healed, imperceptible surgical scar right skull.  Right Ear: External ear normal. EAC/TM nl. Left Ear: External ear normal.  EAC/TM nl. Nose: Nose normal. Mouth/Throat: Oropharynx is clear and moist. Dentition - native, in good repair. No buccal or palatal lesions. Posterior pharynx clear. Eyes: Conjunctivae and sclera clear. EOM intact. Pupils are equal, round, and reactive to light. Right eye exhibits no discharge. Left eye exhibits no discharge. Neck: Normal range of motion. Neck supple. No JVD present. No tracheal deviation present. No thyromegaly present.  Cardiovascular: Normal rate, regular rhythm, no gallop, no friction rub, no murmur heard.      Quiet precordium. 2+ radial and DP pulses . No carotid bruits Pulmonary/Chest: Effort normal. No respiratory distress or increased WOB, no wheezes, no rales. No chest wall deformity or CVAT. Abdominal: Soft. Bowel sounds are normal in all quadrants. He  exhibits no distension, no tenderness, no rebound or guarding, No heptosplenomegaly  Genitourinary: deferred  Musculoskeletal: Normal range of motion. He exhibits no edema and no tenderness.       Small and large joints without redness, synovial thickening or deformity. Full range of motion preserved about all small, median and large joints.  Lymphadenopathy:    He has no cervical or supraclavicular adenopathy.  Neurological: He is alert and oriented to person, place, and time. CN II-XII intact. DTRs 2+ and symmetrical biceps, radial and patellar tendons. Cerebellar function normal with no tremor, rigidity, normal gait and station. Sensation normal to soft touch, pin prick. Normal proprioception Skin: Skin is warm and dry. No rash noted. No erythema.  Psychiatric: He has a normal mood and affect. His behavior is normal. Thought content normal.   Lab Results  Component Value Date   WBC 4.7 07/13/2008   HGB 14.1 07/13/2008   HCT 42.1 07/13/2008   PLT 219 07/13/2008   CHOL 289* 01/03/2011   TRIG 176.0* 01/03/2011   HDL 39.70 01/03/2011   LDLDIRECT 215.3 01/03/2011   ALT 20 01/03/2011   ALT 20 01/03/2011   AST 26 01/03/2011   AST 26 01/03/2011   NA 142 01/03/2011   K 4.4 01/03/2011  CL 108 01/03/2011   CREATININE 1.0 01/03/2011   BUN 18 01/03/2011   CO2 26 01/03/2011        PSA 1.21 01/03/2011   HGBA1C 5.8 01/03/2011       Glucose                  154          Assessment & Plan:

## 2011-01-05 NOTE — Assessment & Plan Note (Signed)
LDL level is out of control - question of patient has been taking his vytorin. He had no complaint of myalgias or or there symptoms.  Plan - telephone follow-up: inquire as to adherence to regimen.

## 2011-01-05 NOTE — Assessment & Plan Note (Signed)
BP Readings from Last 3 Encounters:  01/03/11 138/82  03/29/10 138/88  11/17/09 140/94   Adequate control on present regimen

## 2011-01-05 NOTE — Assessment & Plan Note (Signed)
Interval history is unremarkable. Physical exam is normal. Lab results are in normal limits except for cholesterol values. He is referred for continue colorectal cancer screening and prevention. His PSA was normal range, w/o acceleration. Immunizations: Tetanus '11; he is advised to get zostavax at the [facility]. 12 lead EKG without evidence of active ischemia or injury.   In summary- a very nice man who is medically stable except for out of control hyperlipidemia. He is encouraged to loose weight: smart food choices, portion size control ( hand as guide) and continued regular exercise.  He will return in 6 months or as needed.

## 2011-01-05 NOTE — Assessment & Plan Note (Signed)
A1C is excellent reflecting very good control of blood sugar levels. Exam is normal including foot exam.  Plan - continue present regimen

## 2011-01-10 ENCOUNTER — Encounter: Payer: Self-pay | Admitting: Gastroenterology

## 2011-01-24 ENCOUNTER — Ambulatory Visit: Payer: Medicare Other | Admitting: *Deleted

## 2011-01-24 VITALS — Ht 69.0 in | Wt 200.0 lb

## 2011-01-24 DIAGNOSIS — Z1211 Encounter for screening for malignant neoplasm of colon: Secondary | ICD-10-CM

## 2011-01-24 MED ORDER — PEG-KCL-NACL-NASULF-NA ASC-C 100 G PO SOLR: ORAL | Status: DC

## 2011-02-07 ENCOUNTER — Encounter: Payer: Self-pay | Admitting: Gastroenterology

## 2011-02-07 ENCOUNTER — Ambulatory Visit (AMBULATORY_SURGERY_CENTER): Payer: Medicare Other | Admitting: Gastroenterology

## 2011-02-07 VITALS — BP 150/93 | HR 68 | Temp 97.4°F | Resp 17 | Ht 69.0 in | Wt 200.0 lb

## 2011-02-07 DIAGNOSIS — Z1211 Encounter for screening for malignant neoplasm of colon: Secondary | ICD-10-CM

## 2011-02-07 DIAGNOSIS — D126 Benign neoplasm of colon, unspecified: Secondary | ICD-10-CM

## 2011-02-07 DIAGNOSIS — Z86010 Personal history of colonic polyps: Secondary | ICD-10-CM

## 2011-02-07 LAB — GLUCOSE, CAPILLARY
Glucose-Capillary: 133 mg/dL — ABNORMAL HIGH (ref 70–99)
Glucose-Capillary: 130 mg/dL — ABNORMAL HIGH (ref 70–99)

## 2011-02-07 MED ORDER — SODIUM CHLORIDE 0.9 % IV SOLN: 500.0000 mL | INTRAVENOUS | Status: DC

## 2011-02-08 ENCOUNTER — Telehealth: Payer: Self-pay | Admitting: *Deleted

## 2011-02-08 NOTE — Telephone Encounter (Signed)
Called home and cell number. Neither number with any identifier of first or last name so no message left at either number. [email or web address]

## 2011-02-12 ENCOUNTER — Encounter: Payer: Self-pay | Admitting: Gastroenterology

## 2011-02-13 NOTE — Progress Notes (Signed)
Addended by: Maple Hudson on: 02/13/2011 03:27 PM   Modules accepted: Orders

## 2011-03-11 DIAGNOSIS — Z0279 Encounter for issue of other medical certificate: Secondary | ICD-10-CM

## 2011-11-21 DIAGNOSIS — E119 Type 2 diabetes mellitus without complications: Secondary | ICD-10-CM | POA: Diagnosis not present

## 2011-11-21 DIAGNOSIS — H2510 Age-related nuclear cataract, unspecified eye: Secondary | ICD-10-CM | POA: Diagnosis not present

## 2011-11-21 DIAGNOSIS — H40029 Open angle with borderline findings, high risk, unspecified eye: Secondary | ICD-10-CM | POA: Diagnosis not present

## 2011-11-21 DIAGNOSIS — H25019 Cortical age-related cataract, unspecified eye: Secondary | ICD-10-CM | POA: Diagnosis not present

## 2011-11-25 DIAGNOSIS — Z23 Encounter for immunization: Secondary | ICD-10-CM | POA: Diagnosis not present

## 2011-12-02 ENCOUNTER — Other Ambulatory Visit: Payer: Self-pay | Admitting: Internal Medicine

## 2011-12-14 ENCOUNTER — Other Ambulatory Visit: Payer: Self-pay | Admitting: Internal Medicine

## 2011-12-23 ENCOUNTER — Encounter: Payer: Self-pay | Admitting: Internal Medicine

## 2012-02-10 ENCOUNTER — Other Ambulatory Visit: Payer: Self-pay | Admitting: Internal Medicine

## 2012-02-11 ENCOUNTER — Other Ambulatory Visit: Payer: Self-pay | Admitting: Internal Medicine

## 2012-03-30 ENCOUNTER — Other Ambulatory Visit: Payer: Self-pay | Admitting: Internal Medicine

## 2012-04-18 ENCOUNTER — Other Ambulatory Visit: Payer: Self-pay | Admitting: Internal Medicine

## 2012-04-20 ENCOUNTER — Other Ambulatory Visit: Payer: Self-pay | Admitting: *Deleted

## 2012-04-20 MED ORDER — LISINOPRIL 20 MG PO TABS: 20.0000 mg | ORAL_TABLET | Freq: Every day | ORAL | Status: DC

## 2012-04-28 ENCOUNTER — Ambulatory Visit (INDEPENDENT_AMBULATORY_CARE_PROVIDER_SITE_OTHER): Payer: Medicare Other | Admitting: Internal Medicine

## 2012-04-28 ENCOUNTER — Encounter: Payer: Self-pay | Admitting: Internal Medicine

## 2012-04-28 ENCOUNTER — Other Ambulatory Visit (INDEPENDENT_AMBULATORY_CARE_PROVIDER_SITE_OTHER): Payer: Medicare Other

## 2012-04-28 VITALS — BP 112/80 | HR 79 | Temp 97.6°F | Resp 10 | Ht 67.75 in | Wt 203.0 lb

## 2012-04-28 DIAGNOSIS — D126 Benign neoplasm of colon, unspecified: Secondary | ICD-10-CM

## 2012-04-28 DIAGNOSIS — E785 Hyperlipidemia, unspecified: Secondary | ICD-10-CM | POA: Diagnosis not present

## 2012-04-28 DIAGNOSIS — I10 Essential (primary) hypertension: Secondary | ICD-10-CM | POA: Diagnosis not present

## 2012-04-28 DIAGNOSIS — Z23 Encounter for immunization: Secondary | ICD-10-CM

## 2012-04-28 DIAGNOSIS — E119 Type 2 diabetes mellitus without complications: Secondary | ICD-10-CM

## 2012-04-28 DIAGNOSIS — Z0000 Encounter for general adult medical examination without abnormal findings: Secondary | ICD-10-CM

## 2012-04-28 LAB — COMPREHENSIVE METABOLIC PANEL
CO2: 28 mEq/L (ref 19–32)
Calcium: 9.9 mg/dL (ref 8.4–10.5)
Chloride: 98 mEq/L (ref 96–112)
Creatinine, Ser: 1.2 mg/dL (ref 0.4–1.5)
GFR: 76.33 mL/min (ref >60.00)
Glucose, Bld: 317 mg/dL — ABNORMAL HIGH (ref 70–99)
Total Bilirubin: 0.6 mg/dL (ref 0.3–1.2)
Total Protein: 8.3 g/dL (ref 6.0–8.3)

## 2012-04-28 LAB — HEPATIC FUNCTION PANEL
AST: 28 U/L (ref 0–37)
Alkaline Phosphatase: 50 U/L (ref 39–117)
Total Bilirubin: 0.6 mg/dL (ref 0.3–1.2)

## 2012-04-28 LAB — LIPID PANEL
HDL: 30.4 mg/dL — ABNORMAL LOW (ref >39.00)
Triglycerides: 178 mg/dL — ABNORMAL HIGH (ref 0.0–149.0)

## 2012-04-28 LAB — HEMOGLOBIN A1C: Hgb A1c MFr Bld: 10.9 % — ABNORMAL HIGH (ref 4.6–6.5)

## 2012-04-28 NOTE — Progress Notes (Signed)
Subjective:    Patient ID: Jesse Woods, male    DOB: Apr 24, 1950, 62 y.o.   MRN: [id number]  HPI The patient is here for annual Medicare wellness examination and management of other chronic and acute problems. In the interval he has had no major illness or surgery or injury. He continues to have peripheral neuropathy feet.    The risk factors are reflected in the social history.  The roster of all physicians providing medical care to patient - is listed in the Snapshot section of the chart.  Activities of daily living:  The patient is 100% inedpendent in all ADLs: dressing, toileting, feeding as well as independent mobility  Home safety : The patient has smoke detectors in the home. Falls - no falls. Home is fall safe. They wear seatbelts. firearms are present in the home, kept in a safe fashion. There is no violence in the home.   There is no risks for hepatitis, STDs or HIV. There is no history of blood transfusion. They have no travel history to infectious disease endemic areas of the world.  The patient has seen their dentist in the last six month. They have seen their eye doctor in the last year. They deny any hearing difficulty and have not had audiologic testing in the last year.    They do not  have excessive sun exposure. Discussed the need for sun protection: hats, long sleeves and use of sunscreen if there is significant sun exposure.   Diet: the importance of a healthy diet is discussed. They do have a healthy  diet.  The patient has a regular exercise program: weights and full-court basketball , 45 min duration, 2-3 per week.  The benefits of regular aerobic exercise were discussed.  Depression screen: there are no signs or vegative symptoms of depression- irritability, change in appetite, anhedonia, sadness/tearfullness.  Cognitive assessment: the patient manages all their financial and personal affairs and is actively engaged.   The following portions of the patient's  history were reviewed and updated as appropriate: allergies, current medications, past family history, past medical history,  past surgical history, past social history  and problem list.  Past Medical History  Diagnosis Date  . Diabetes mellitus, type II   . Hyperlipidemia   . Hypertension    Past Surgical History  Procedure Date  . Brain surgery 1998    right frontal ventriculotomy with subsequent ventriculo-abfdominal shunt due to [facility]  . Hernia repair 1954    right   Family History  Problem Relation Age of Onset  . Cancer Mother     pancreatic  . Pancreatic cancer Mother   . Diabetes Father   . Hypertension Father   . Cancer Father     prostate cancer  . Prostate cancer Father   . Cancer Brother     prostate cancer - cause of death  . Cancer Cousin     prostate cancer  . Prostate cancer Paternal Uncle    History   Social History  . Marital Status: Divorced    Spouse Name: N/A    Number of Children: N/A  . Years of Education: N/A   Occupational History  . Not on file.   Social History Main Topics  . Smoking status: Never Smoker   . Smokeless tobacco: Never Used  . Alcohol Use: No  . Drug Use: No  . Sexually Active: Yes -- Male partner(s)   Other Topics Concern  . 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 Married- '72-'86; remarried '86-'90, single. 1 son- '73, 1 daughter-'79; 2 grandchildren. Retired after [employer], worked for [employer] before retirement. Had served in [employer] force. He did live in [place]. Lives together with his son. Very active in his church    Current Outpatient Prescriptions on File Prior to Visit  Medication Sig Dispense Refill  . aspirin 81 MG tablet Take 81 mg by mouth daily.        Marland Kitchen lisinopril (PRINIVIL,ZESTRIL) 20 MG tablet Take 20 mg by mouth 2 (two) times daily.      . metFORMIN (GLUCOPHAGE) 500 MG tablet take 1 tablet by mouth once daily  30 tablet  1  .  Multiple Vitamins-Minerals (MULTIVITAMIN WITH MINERALS) tablet Take 1 tablet by mouth daily.        Marland Kitchen VYTORIN 10-80 MG per tablet TAKE 1 TABLET BY MOUTH ONCE DAILY  30 each  1     Vision, hearing, body mass index were assessed and reviewed.   During the course of the visit the patient was educated and counseled about appropriate screening and preventive services including : fall prevention , diabetes screening, nutrition counseling, colorectal cancer screening, and recommended immunizations.    Review of Systems Constitutional:  Negative for fever, chills, activity change and unexpected weight change.  HEENT:  Negative for hearing loss, ear pain, congestion, neck stiffness and postnasal drip. Negative for sore throat or swallowing problems. Negative for dental complaints.   Eyes: Negative for vision loss or change in visual acuity.  Respiratory: Negative for chest tightness and wheezing. Negative for DOE.   Cardiovascular: Negative for chest pain or palpitations. No decreased exercise tolerance Gastrointestinal: No change in bowel habit. No bloating or gas. No reflux or indigestion Genitourinary: Negative for urgency, frequency, flank pain and difficulty urinating.  Musculoskeletal: Negative for myalgias, back pain, arthralgias and gait problem.  Neurological: Negative for dizziness, tremors, weakness and headaches.  Hematological: Negative for adenopathy.  Psychiatric/Behavioral: Negative for behavioral problems and dysphoric mood.       Objective:   Physical Exam Filed Vitals:   04/28/12 1335  BP: 112/80  Pulse: 79  Temp: 97.6 F (36.4 C)  Resp: 10   Wt Readings from Last 3 Encounters:  04/28/12 203 lb 0.6 oz (92.098 kg)  02/07/11 200 lb (90.719 kg)  01/24/11 200 lb (90.719 kg)   Gen'l: Well nourished well developed AA male in no acute distress  HEENT: Head: Normocephalic and atraumatic. Right Ear: External ear normal. EAC/TM nl. Left Ear: External ear normal.  EAC/TM nl.  Nose: Nose normal. Mouth/Throat: Oropharynx is clear and moist. Dentition - native, in good repair. No buccal or palatal lesions. Posterior pharynx clear. Eyes: Conjunctivae and sclera clear. EOM intact. Pupils are equal, round, and reactive to light. Right eye exhibits no discharge. Left eye exhibits no discharge. Shunt right parietal skull - soft, w/o heat or tenderness Neck: Normal range of motion. Neck supple. No JVD present. No tracheal deviation present. No thyromegaly present.  Cardiovascular: Normal rate, regular rhythm, no gallop, no friction rub, no murmur heard.      Quiet precordium. 2+ radial and DP pulses . No carotid bruits Pulmonary/Chest: Effort normal. No respiratory distress or increased WOB, no wheezes, no rales. No chest wall deformity or CVAT. Abdomen: Soft. Bowel sounds are normal in all quadrants. He exhibits no distension, no tenderness, no rebound or guarding, No heptosplenomegaly. Well healed scar RUQ for Ventriculo-abdominal shunt  Genitourinary:  deferred Musculoskeletal: Normal range of motion. He exhibits no edema and no tenderness.       Small and large joints without redness, synovial thickening or deformity. Full range of motion preserved about all small, median and large joints.  Lymphadenopathy:    He has no cervical or supraclavicular adenopathy.  Neurological: He is alert and oriented to person, place, and time. CN II-XII intact. DTRs 2+ and symmetrical biceps, radial and patellar tendons. Cerebellar function normal with no tremor, rigidity, normal gait and station. Left foot - normal sensation to light touch, pin-prick and vibration. Skin: Skin is warm and dry. No rash noted. No erythema.  Psychiatric: He has a normal mood and affect. His behavior is normal. Thought content normal.   Lab Results  Component Value Date   WBC 4.7 07/13/2008   HGB 14.1 07/13/2008   HCT 42.1 07/13/2008   PLT 219 07/13/2008   GLUCOSE 317* 04/28/2012   CHOL 186 04/28/2012   TRIG 178.0*  04/28/2012   HDL 30.40* 04/28/2012   LDLDIRECT 215.3 01/03/2011   LDLCALC 120* 04/28/2012        ALT 36 04/28/2012   AST 28 04/28/2012        NA 132* 04/28/2012   K 4.5 04/28/2012   CL 98 04/28/2012   CREATININE 1.2 04/28/2012   BUN 16 04/28/2012   CO2 28 04/28/2012   TSH 1.216  07/13/2008   PSA 1.21 01/03/2011   HGBA1C 10.9* 04/28/2012         Assessment & Plan:

## 2012-04-28 NOTE — Patient Instructions (Addendum)
Thanks for coming to see me. You appear to be doing well.  You will get a full report from today's visit that will include the lab work.   We will give you pneumonia vaccine today and this will be good for 5 years. In about 4 weeks you need to have shingles vaccine (zostavax) at the drugstore, once a done.  Come back in one year or sooner as needed.

## 2012-04-29 MED ORDER — METFORMIN HCL 1000 MG PO TABS: 1000.0000 mg | ORAL_TABLET | Freq: Two times a day (BID) | ORAL | Status: DC

## 2012-04-29 NOTE — Assessment & Plan Note (Signed)
A1C is very high - above goal of 7%  Plan Improved dietary adherence - NO SUGAR low carb diet  Continued vigorous exercise  Increase metformin to 1,000 mg twice a day.  Follow up A1C in 3 months

## 2012-04-29 NOTE — Assessment & Plan Note (Signed)
Interval history has been unremarkable. Physical exam was normal. Lab with elevated A1C and LDL above goal, otherwise oK. He is current with colorectal cancer screening. Discussed pros and cons of prostate cancer screening (USPHCTF recommendations reviewed and ACU April '13 recommendations) and he defers evaluation at this time with normal PSA 2012. Immunizations are brought up to date and he is provided an Rx for Zostavax for administration in 4 weeks.  In summary - a very nice man who appears healthy but his numbers are not at goal. He will increased medications as noted and return for lab in April.

## 2012-04-29 NOTE — Assessment & Plan Note (Signed)
LDL at 120 is greater than goal of 100 or less.  Plan No change in medication.  Life-style management - improved adherence to LOW FAT diet, continue to play full court basketball  Repeat lipid panel in 30 months

## 2012-04-29 NOTE — Assessment & Plan Note (Signed)
BP Readings from Last 3 Encounters:  04/28/12 112/80  02/07/11 150/93  01/03/11 138/82   OK control on present medication.

## 2012-05-19 ENCOUNTER — Other Ambulatory Visit: Payer: Self-pay | Admitting: Internal Medicine

## 2012-05-23 ENCOUNTER — Other Ambulatory Visit: Payer: Self-pay | Admitting: Internal Medicine

## 2012-05-25 ENCOUNTER — Other Ambulatory Visit: Payer: Self-pay | Admitting: *Deleted

## 2012-05-25 DIAGNOSIS — E119 Type 2 diabetes mellitus without complications: Secondary | ICD-10-CM

## 2012-05-25 MED ORDER — METFORMIN HCL 1000 MG PO TABS: 1000.0000 mg | ORAL_TABLET | Freq: Two times a day (BID) | ORAL | Status: DC

## 2012-06-04 ENCOUNTER — Other Ambulatory Visit: Payer: Self-pay | Admitting: Internal Medicine

## 2012-11-24 DIAGNOSIS — H40019 Open angle with borderline findings, low risk, unspecified eye: Secondary | ICD-10-CM | POA: Diagnosis not present

## 2012-11-24 DIAGNOSIS — E119 Type 2 diabetes mellitus without complications: Secondary | ICD-10-CM | POA: Diagnosis not present

## 2012-11-24 DIAGNOSIS — H2589 Other age-related cataract: Secondary | ICD-10-CM | POA: Diagnosis not present

## 2012-11-24 LAB — HM DIABETES EYE EXAM: HM Diabetic Eye Exam: NORMAL

## 2012-11-27 ENCOUNTER — Encounter: Payer: Self-pay | Admitting: Internal Medicine

## 2012-12-01 DIAGNOSIS — Z23 Encounter for immunization: Secondary | ICD-10-CM | POA: Diagnosis not present

## 2013-05-20 ENCOUNTER — Other Ambulatory Visit: Payer: Self-pay | Admitting: Internal Medicine

## 2013-06-01 ENCOUNTER — Other Ambulatory Visit: Payer: Self-pay | Admitting: Internal Medicine

## 2013-06-04 ENCOUNTER — Other Ambulatory Visit: Payer: Self-pay | Admitting: Internal Medicine

## 2013-08-09 ENCOUNTER — Emergency Department (HOSPITAL_COMMUNITY)
Admission: EM | Admit: 2013-08-09 | Discharge: 2013-08-09 | Disposition: A | Discharge status: Home / Self Care | Payer: Medicare Other | Attending: Emergency Medicine | Admitting: Emergency Medicine

## 2013-08-09 ENCOUNTER — Emergency Department (HOSPITAL_COMMUNITY): Payer: Medicare Other

## 2013-08-09 ENCOUNTER — Encounter (HOSPITAL_COMMUNITY): Payer: Self-pay | Admitting: Emergency Medicine

## 2013-08-09 ENCOUNTER — Telehealth: Payer: Self-pay | Admitting: Internal Medicine

## 2013-08-09 DIAGNOSIS — R059 Cough, unspecified: Secondary | ICD-10-CM | POA: Diagnosis not present

## 2013-08-09 DIAGNOSIS — R0609 Other forms of dyspnea: Secondary | ICD-10-CM | POA: Diagnosis not present

## 2013-08-09 DIAGNOSIS — R05 Cough: Secondary | ICD-10-CM | POA: Insufficient documentation

## 2013-08-09 DIAGNOSIS — I10 Essential (primary) hypertension: Secondary | ICD-10-CM | POA: Insufficient documentation

## 2013-08-09 DIAGNOSIS — R0989 Other specified symptoms and signs involving the circulatory and respiratory systems: Principal | ICD-10-CM | POA: Insufficient documentation

## 2013-08-09 DIAGNOSIS — R0602 Shortness of breath: Secondary | ICD-10-CM | POA: Diagnosis not present

## 2013-08-09 DIAGNOSIS — Z7982 Long term (current) use of aspirin: Secondary | ICD-10-CM | POA: Insufficient documentation

## 2013-08-09 DIAGNOSIS — Z79899 Other long term (current) drug therapy: Secondary | ICD-10-CM | POA: Insufficient documentation

## 2013-08-09 DIAGNOSIS — J40 Bronchitis, not specified as acute or chronic: Secondary | ICD-10-CM | POA: Diagnosis not present

## 2013-08-09 DIAGNOSIS — R0600 Dyspnea, unspecified: Secondary | ICD-10-CM

## 2013-08-09 DIAGNOSIS — E119 Type 2 diabetes mellitus without complications: Secondary | ICD-10-CM | POA: Diagnosis not present

## 2013-08-09 LAB — CBC WITH DIFFERENTIAL/PLATELET
Basophils Absolute: 0 10*3/uL (ref 0.0–0.1)
Basophils Relative: 0 % (ref 0–1)
EOS PCT: 3 % (ref 0–5)
Eosinophils Absolute: 0.2 10*3/uL (ref 0.0–0.7)
HEMATOCRIT: 38.8 % — AB (ref 39.0–52.0)
Hemoglobin: 13 g/dL (ref 13.0–17.0)
LYMPHS ABS: 1.5 10*3/uL (ref 0.7–4.0)
Lymphocytes Relative: 28 % (ref 12–46)
MCH: 28.7 pg (ref 26.0–34.0)
MCHC: 33.5 g/dL (ref 30.0–36.0)
MCV: 85.7 fL (ref 78.0–100.0)
MONO ABS: 0.4 10*3/uL (ref 0.1–1.0)
Monocytes Relative: 7 % (ref 3–12)
Neutro Abs: 3.4 10*3/uL (ref 1.7–7.7)
Neutrophils Relative %: 62 % (ref 43–77)
Platelets: 192 10*3/uL (ref 150–400)
RBC: 4.53 MIL/uL (ref 4.22–5.81)
RDW: 13.1 % (ref 11.5–15.5)
WBC: 5.5 10*3/uL (ref 4.0–10.5)

## 2013-08-09 LAB — BASIC METABOLIC PANEL
BUN: 10 mg/dL (ref 6–23)
CALCIUM: 9.2 mg/dL (ref 8.4–10.5)
CO2: 26 meq/L (ref 19–32)
Chloride: 106 mEq/L (ref 96–112)
Creatinine, Ser: 0.92 mg/dL (ref 0.50–1.35)
GFR calc Af Amer: >90 mL/min (ref >90)
GFR, EST NON AFRICAN AMERICAN: 87 mL/min — AB (ref >90)
GLUCOSE: 119 mg/dL — AB (ref 70–99)
Potassium: 4.1 mEq/L (ref 3.7–5.3)
Sodium: 144 mEq/L (ref 137–147)

## 2013-08-09 LAB — D-DIMER, QUANTITATIVE: D-Dimer, Quant: 0.6 ug/mL-FEU — ABNORMAL HIGH (ref 0.00–0.48)

## 2013-08-09 LAB — PRO B NATRIURETIC PEPTIDE: Pro B Natriuretic peptide (BNP): 393.4 pg/mL — ABNORMAL HIGH (ref 0–125)

## 2013-08-09 NOTE — Telephone Encounter (Signed)
Response appropriate

## 2013-08-09 NOTE — ED Provider Notes (Signed)
CSN: [id number]     Arrival date & time 08/09/13  1016 History   First MD Initiated Contact with Patient 08/09/13 1143     Chief Complaint  Patient presents with  . Shortness of Breath  . Cough     (Consider location/radiation/quality/duration/timing/severity/associated sxs/prior Treatment) HPI  Jesse Woods is a 63 y.o. male he presents for evaluation of ongoing shortness of breath that is present with exertion, and at night. He denies fever, chills, nausea, vomiting, weakness, or dizziness. There's been no chest pain, back, pain, or abdominal pain. He has had nasal congestion, without rhinorrhea, sore throat or ear pain. He, states his usual medication, without relief. There are no other known modifying factors.   Past Medical History  Diagnosis Date  . Diabetes mellitus, type II   . Hyperlipidemia   . Hypertension    Past Surgical History  Procedure Laterality Date  . 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 Hernia repair  1954    right   Family History  Problem Relation Age of Onset  . Cancer Mother     pancreatic  . Pancreatic cancer Mother   . Diabetes Father   . Hypertension Father   . Cancer Father     prostate cancer  . Prostate cancer Father   . Cancer Brother     prostate cancer - cause of death  . Cancer Cousin     prostate cancer  . Prostate cancer Paternal Uncle    History  Substance Use Topics  . Smoking status: Never Smoker   . Smokeless tobacco: Never Used  . Alcohol Use: No    Review of Systems  All other systems reviewed and are negative.     Allergies  Review of patient's allergies indicates no known allergies.  Home Medications   Prior to Admission medications   Medication Sig Start Date End Date Taking? Authorizing Provider  aspirin 81 MG tablet Take 81 mg by mouth daily.     Yes Historical Provider, MD  lisinopril (PRINIVIL,ZESTRIL) 20 MG tablet take 1  tablet by mouth twice a day 06/01/13  Yes Neena Rhymes, MD  metFORMIN (GLUCOPHAGE) 1000 MG tablet take 1 tablet by mouth twice a day with meals 05/20/13  Yes Neena Rhymes, MD  Multiple Vitamins-Minerals (MULTIVITAMIN WITH MINERALS) tablet Take 1 tablet by mouth daily.     Yes Historical Provider, MD  tetrahydrozoline-zinc (VISINE-AC) 0.05-0.25 % ophthalmic solution Place 1 drop into both eyes daily as needed (allergy irritation.).   Yes Historical Provider, MD  VYTORIN 10-80 MG per tablet take 1 tablet by mouth once daily 06/04/13  Yes Neena Rhymes, MD   BP 135/97  Pulse 86  Temp(Src) 98.6 F (37 C) (Oral)  Resp 18  SpO2 100% Physical Exam  Nursing note and vitals reviewed. Constitutional: He is oriented to person, place, and time. He appears well-developed and well-nourished. No distress.  HENT:  Head: Normocephalic and atraumatic.  Right Ear: External ear normal.  Left Ear: External ear normal.  Eyes: Conjunctivae and EOM are normal. Pupils are equal, round, and reactive to light.  Neck: Normal range of motion and phonation normal. Neck supple.  Cardiovascular: Normal rate, regular rhythm, normal heart sounds and intact distal pulses.   Pulmonary/Chest: Effort normal and breath sounds normal. No respiratory distress. He has no wheezes. He has no rales. He exhibits no bony tenderness.  Abdominal: Soft. There is no  tenderness.  Musculoskeletal: Normal range of motion.  Neurological: He is alert and oriented to person, place, and time. No cranial nerve deficit or sensory deficit. He exhibits normal muscle tone. Coordination normal.  Skin: Skin is warm, dry and intact.  Psychiatric: He has a normal mood and affect. His behavior is normal. Judgment and thought content normal.    ED Course  Procedures (including critical care time)   Medications - No data to display  Patient Vitals for the past 24 hrs:  BP Temp Temp src Pulse Resp SpO2  08/09/13 1434 135/97 mmHg 98.6 F (37  C) Oral 86 18 100 %  08/09/13 1044 147/105 mmHg 97.8 F (36.6 C) Oral 84 16 95 %    At discharge- Reevaluation with update and discussion. After initial assessment and treatment, an updated evaluation reveals no additional complaints. Findings discussed with patient, all questions answered. [place] Review Labs Reviewed  CBC WITH DIFFERENTIAL - Abnormal; Notable for the following:    HCT 38.8 (*)    All other components within normal limits  BASIC METABOLIC PANEL - Abnormal; Notable for the following:    Glucose, Bld 119 (*)    GFR calc non Af Amer 87 (*)    All other components within normal limits  D-DIMER, QUANTITATIVE - Abnormal; Notable for the following:    D-Dimer, Quant 0.60 (*)    All other components within normal limits  PRO B NATRIURETIC PEPTIDE - Abnormal; Notable for the following:    Pro B Natriuretic peptide (BNP) 393.4 (*)    All other components within normal limits    Imaging Review Dg Chest 2 View  08/09/2013   CLINICAL DATA:  Cough, shortness of breath, diabetes  EXAM: CHEST  2 VIEW  COMPARISON:  None.  FINDINGS: Cardiomediastinal silhouette is unremarkable. No acute infiltrate or pleural effusion. No pulmonary edema. Central mild bronchitic changes. Mild degenerative changes thoracic spine.  IMPRESSION: No acute infiltrate or pulmonary edema. Central mild bronchitic changes.   Electronically Signed   By: Lahoma Crocker M.D.   On: 08/09/2013 13:12     EKG Interpretation   Date/Time:  Monday Aug 09 2013 10:49:48 EDT Ventricular Rate:  85 PR Interval:  157 QRS Duration: 89 QT Interval:  392 QTC Calculation: 466 R Axis:   41 Text Interpretation:  Sinus rhythm Probable left atrial enlargement  Borderline low voltage, extremity leads T wave flattening/inversion in  inf-lat leads Baseline wander in lead(s) V2 No previous tracing Confirmed  by POLLINA  MD, Felton ([id number]) on 08/09/2013 10:52:47 AM      MDM   Final diagnoses:  Dyspnea    Nonspecific dyspnea, with essentially normal examination. There is minimal elevation of BNP and no chest x-ray medication for CHF or pneumonia. There is mild adjacent for bronchitis, but no auscultatory findings consistent with bronchitis. He has a normal age adjusted d-dimer. His symptoms may be related to environmental allergies. Doubt serious bacterial infection, metabolic instability, or impending vascular collapse   Nursing Notes Reviewed/ Care Coordinated Applicable Imaging Reviewed Interpretation of Laboratory Data incorporated into ED treatment  The patient appears reasonably screened and/or stabilized for discharge and I doubt any other medical condition or other [facility] requiring further screening, evaluation, or treatment in the ED at this time prior to discharge.  Plan: Home Medications- Zyrtec daily; Home Treatments- rest; return here if the recommended treatment, does not improve the symptoms; Recommended follow up- PCP if not better in 1  week  Richarda Blade, MD 08/09/13 [phone]

## 2013-08-09 NOTE — Telephone Encounter (Signed)
Patient Information:  Caller Name: Harvey  Phone: [phone]  Patient: Jesse Woods, Jesse Woods  Gender: Male  DOB: [birth year]  Age: 63 Years  PCP: Adella Hare  Office Follow Up:  Does the office need to follow up with this patient?: No  Instructions For The Office: N/A  RN Note:  Chest discomfort is substernal and continous. Pain does not radiate. Discomfort rated 2-3/10. No diaphoresis or nausea.  Mildly short of breath.  Fatigues with exertion. Does not have glucometer or BP machine at home. Worked out pectoral muscles 2 weeks ago. Advised call 911 now for chest pain lasting longer than 5 minutes now and risk factors (male, age > 31, DM, HTN, and dyslipidemia). Verbalized understanding.  Symptoms  Reason For Call & Symptoms: Chest pain with shortness of breath for past week.  Chest tighness "just wont go away."  Reviewed Health History In EMR: Yes  Reviewed Medications In EMR: Yes  Reviewed Allergies In EMR: Yes  Reviewed Surgeries / Procedures: Yes  Date of Onset of Symptoms: 08/02/2013  Treatments Tried: rest  Treatments Tried Worked: No  Guideline(s) Used:  Chest Pain  Disposition Per Guideline:   Call EMS 911 Now  Reason For Disposition Reached:   Chest pain lasting longer than 5 minutes and ANY of the following:  Over 56 years old Over 44 years old and at least one cardiac risk factor (i.e., high blood pressure, diabetes, high cholesterol, obesity, smoker or strong family history of heart disease) Pain is crushing, pressure-like, or heavy  Took nitroglycerin and chest pain was not relieved History of heart disease (i.e., angina, heart attack, bypass surgery, angioplasty, CHF)  Advice Given:  N/A  Patient Will Follow Care Advice:  YES

## 2013-08-09 NOTE — ED Notes (Signed)
MD at bedside. 

## 2013-08-09 NOTE — ED Notes (Signed)
Pt states he feels sob. PT states he is unable to play a whole game of basketball without breaks. Pt states this has been going on for several weeks. Pt states he has occasional cough. Pt speaking in complete sentences. NAD

## 2013-08-09 NOTE — Discharge Instructions (Signed)
Try taking Claritin, or Zyrtec daily, for your symptoms. Is likely that your symptoms are related to environmental allergies    Shortness of Breath Shortness of breath means you have trouble breathing. Shortness of breath needs medical care right away. HOME CARE   Do not smoke.  Avoid being around chemicals or things (paint fumes, dust) that may bother your breathing.  Rest as needed. Slowly begin your normal activities.  Only take medicines as told by your doctor.  Keep all doctor visits as told. GET HELP RIGHT AWAY IF:   Your shortness of breath gets worse.  You feel lightheaded, pass out (faint), or have a cough that is not helped by medicine.  You cough up blood.  You have pain with breathing.  You have pain in your chest, arms, shoulders, or belly (abdomen).  You have a fever.  You cannot walk up stairs or exercise the way you normally do.  You do not get better in the time expected.  You have a hard time doing normal activities even with rest.  You have problems with your medicines.  You have any new symptoms. MAKE SURE YOU:  Understand these instructions.  Will watch your condition.  Will get help right away if you are not doing well or get worse. Document Released: 09/11/2007 Document Revised: 09/24/2011 Document Reviewed: 06/10/2011 [facility] Patient Information 2014 [place], [place].

## 2013-08-10 ENCOUNTER — Ambulatory Visit: Payer: Medicare Other | Admitting: Internal Medicine

## 2013-08-10 DIAGNOSIS — Z0289 Encounter for other administrative examinations: Secondary | ICD-10-CM

## 2013-08-10 DIAGNOSIS — R4689 Other symptoms and signs involving appearance and behavior: Secondary | ICD-10-CM | POA: Insufficient documentation

## 2013-11-22 DIAGNOSIS — Z23 Encounter for immunization: Secondary | ICD-10-CM | POA: Diagnosis not present

## 2013-11-25 DIAGNOSIS — E119 Type 2 diabetes mellitus without complications: Secondary | ICD-10-CM | POA: Diagnosis not present

## 2013-11-25 DIAGNOSIS — H2589 Other age-related cataract: Secondary | ICD-10-CM | POA: Diagnosis not present

## 2013-11-25 DIAGNOSIS — H40019 Open angle with borderline findings, low risk, unspecified eye: Secondary | ICD-10-CM | POA: Diagnosis not present

## 2013-12-02 ENCOUNTER — Encounter: Payer: Self-pay | Admitting: Internal Medicine

## 2014-02-09 ENCOUNTER — Telehealth: Payer: Self-pay | Admitting: Family

## 2014-02-09 ENCOUNTER — Other Ambulatory Visit (INDEPENDENT_AMBULATORY_CARE_PROVIDER_SITE_OTHER): Payer: Medicare Other

## 2014-02-09 ENCOUNTER — Ambulatory Visit (INDEPENDENT_AMBULATORY_CARE_PROVIDER_SITE_OTHER)
Admission: RE | Admit: 2014-02-09 | Discharge: 2014-02-09 | Disposition: A | Discharge status: Home / Self Care | Payer: Medicare Other | Source: Ambulatory Visit | Attending: Family | Admitting: Family

## 2014-02-09 ENCOUNTER — Ambulatory Visit (INDEPENDENT_AMBULATORY_CARE_PROVIDER_SITE_OTHER): Payer: Medicare Other | Admitting: Family

## 2014-02-09 VITALS — BP 170/120 | HR 98 | Temp 97.7°F | Resp 18 | Ht 68.0 in | Wt 207.8 lb

## 2014-02-09 DIAGNOSIS — R0600 Dyspnea, unspecified: Secondary | ICD-10-CM | POA: Diagnosis not present

## 2014-02-09 DIAGNOSIS — I10 Essential (primary) hypertension: Secondary | ICD-10-CM | POA: Diagnosis not present

## 2014-02-09 DIAGNOSIS — E785 Hyperlipidemia, unspecified: Secondary | ICD-10-CM | POA: Diagnosis not present

## 2014-02-09 DIAGNOSIS — I517 Cardiomegaly: Secondary | ICD-10-CM | POA: Diagnosis not present

## 2014-02-09 DIAGNOSIS — M7989 Other specified soft tissue disorders: Secondary | ICD-10-CM | POA: Diagnosis not present

## 2014-02-09 LAB — BASIC METABOLIC PANEL
BUN: 26 mg/dL — AB (ref 6–23)
CHLORIDE: 109 meq/L (ref 96–112)
CO2: 23 mEq/L (ref 19–32)
CREATININE: 1.3 mg/dL (ref 0.4–1.5)
Calcium: 9.8 mg/dL (ref 8.4–10.5)
GFR: 68.76 mL/min (ref >60.00)
Glucose, Bld: 129 mg/dL — ABNORMAL HIGH (ref 70–99)
POTASSIUM: 4.7 meq/L (ref 3.5–5.1)
Sodium: 141 mEq/L (ref 135–145)

## 2014-02-09 LAB — BRAIN NATRIURETIC PEPTIDE: Pro B Natriuretic peptide (BNP): 1328 pg/mL — ABNORMAL HIGH (ref 0.0–100.0)

## 2014-02-09 LAB — CBC
HEMATOCRIT: 39.6 % (ref 39.0–52.0)
Hemoglobin: 12.1 g/dL — ABNORMAL LOW (ref 13.0–17.0)
MCHC: 30.5 g/dL (ref 30.0–36.0)
MCV: 88.1 fl (ref 78.0–100.0)
PLATELETS: 295 10*3/uL (ref 150.0–400.0)
RBC: 4.49 Mil/uL (ref 4.22–5.81)
RDW: 15.4 % (ref 11.5–15.5)
WBC: 7.1 10*3/uL (ref 4.0–10.5)

## 2014-02-09 MED ORDER — EZETIMIBE-SIMVASTATIN 10-80 MG PO TABS: ORAL_TABLET | ORAL | Status: DC

## 2014-02-09 MED ORDER — HYDROCHLOROTHIAZIDE 25 MG PO TABS: 25.0000 mg | ORAL_TABLET | Freq: Every day | ORAL | Status: DC

## 2014-02-09 NOTE — Assessment & Plan Note (Signed)
No obvious cause of edema. Potentially related to CHF or fluid overload. Start HCTZ. Wear compression socks and elevate feet as able. Follow up if symptoms worsen or fail to improve.

## 2014-02-09 NOTE — Assessment & Plan Note (Signed)
Given symptoms and exam, concern for heart failure. Blood pressure also remains high. Obtain chest x-ray, BNP, CBC, and 2-D Echo. Refer to cardiology for assistance in management. Start HCTZ 25 mg daily. Use compression socks and elevate feet as able. Follow up in 1 month or sooner if symptoms fail to improve or worsen.

## 2014-02-09 NOTE — Progress Notes (Signed)
Pre visit review using our clinic review tool, if applicable. No additional management support is needed unless otherwise documented below in the visit note. 

## 2014-02-09 NOTE — Progress Notes (Signed)
   Subjective:    Patient ID: Jesse Woods, male    DOB: [birth year], 63 y.o.   MRN: [id number]  Chief Complaint  Patient presents with  . Foot Swelling    x3 weeks, sob for a year but noticed it more recently   HPI:  Jesse Woods is a 63 y.o. male who presents today for foot swelling.  Feet swelling has been going on for the last month. Does not recall any new medications, other than an energy booster. There is nothing that makes it better or worse. Has not tried any treatments to improve the swelling.   Shortness of breath has been going on for about a year, but has progressively worse. Was able to play basketball and has progressed to not being able to go to the mailbox with being short of breath. Feels lightheaded at time, but denies chest pain/discomfort. Was previously seen in the ED for shortness of breath - records have been reviewed.    BP Readings from Last 3 Encounters:  02/09/14 170/120  08/09/13 135/97  04/28/12 112/80   No Known Allergies  Current Outpatient Prescriptions on File Prior to Visit  Medication Sig Dispense Refill  . aspirin 81 MG tablet Take 81 mg by mouth daily.      Marland Kitchen lisinopril (PRINIVIL,ZESTRIL) 20 MG tablet take 1 tablet by mouth twice a day 60 tablet 11  . metFORMIN (GLUCOPHAGE) 1000 MG tablet take 1 tablet by mouth twice a day with meals 180 tablet 3  . Multiple Vitamins-Minerals (MULTIVITAMIN WITH MINERALS) tablet Take 1 tablet by mouth daily.      Marland Kitchen tetrahydrozoline-zinc (VISINE-AC) 0.05-0.25 % ophthalmic solution Place 1 drop into both eyes daily as needed (allergy irritation.).     No current facility-administered medications on file prior to visit.   Past Medical History  Diagnosis Date  . Diabetes mellitus, type II   . Hyperlipidemia   . Hypertension     Review of Systems   See HPI   Objective:    BP 170/120 mmHg  Pulse 98  Temp(Src) 97.7 F (36.5 C) (Oral)  Resp 18  Ht 5\' 8"  (1.727 m)  Wt 207 lb 12.8 oz (94.257 kg)   BMI 31.60 kg/m2  SpO2 96% Nursing note and vital signs reviewed.  Physical Exam  Constitutional: He is oriented to person, place, and time. He appears well-developed and well-nourished. No distress.  Cardiovascular: Normal rate, regular rhythm, normal heart sounds and intact distal pulses.   1-2+ pitting edema of bilateral lower extremities noted.   Pulmonary/Chest: Breath sounds normal.  Has slight increase in work of breathing especially with activity.   Neurological: He is alert and oriented to person, place, and time.  Skin: Skin is warm and dry.  Psychiatric: He has a normal mood and affect. His behavior is normal. Judgment and thought content normal.      Assessment & Plan:

## 2014-02-09 NOTE — Patient Instructions (Addendum)
Thank you for choosing [facility].  Summary/Instructions:   Please stop by radiology for a chest x-ray and the lab for your blood work.  Please start taking the hydrochlorothiazide  May use compression socks to help with leg swelling and keep legs elevated when able.   You have been referred to cardiology and should hear back in about a week.  If your symptoms worsen or fail to improve please let us know or go to the Emergency Room.

## 2014-02-09 NOTE — Telephone Encounter (Signed)
Please call the patient and inform him that his chest x-ray showed that he had a slightly enlarged heart. In addition his BNP which measures the amount of fluid in his body was higher than anticipated. These factors combined are leading to a potential diagnosis of heart failure. The HCTZ should help to reduce the amount of fluid in his body and help him breath slightly easier. Originally determined a follow up in about a month, let's have him plan to follow up in a week if that is possible so that we can check his progress. Advise him if his shortness of breath increases after hours, he should go to the emergency room.

## 2014-02-10 NOTE — Telephone Encounter (Signed)
Pt called back. I let him know that his xray showed slightly enlarged heart and his BNP was higher than anticipated. I let him know that those factors are leading to the diagnosis of potential CHF. He seemed concerned and I directed him to scheduling department so he could make a follow up appointment as soon as possible due to his concerns.

## 2014-02-10 NOTE — Telephone Encounter (Signed)
Tried calling pt. No answer left message for him to call back.

## 2014-02-16 ENCOUNTER — Ambulatory Visit (INDEPENDENT_AMBULATORY_CARE_PROVIDER_SITE_OTHER): Payer: Medicare Other | Admitting: Family

## 2014-02-16 ENCOUNTER — Encounter: Payer: Self-pay | Admitting: Family

## 2014-02-16 VITALS — BP 138/90 | HR 98 | Temp 97.9°F | Resp 18 | Ht 68.0 in | Wt 195.0 lb

## 2014-02-16 DIAGNOSIS — M7989 Other specified soft tissue disorders: Secondary | ICD-10-CM

## 2014-02-16 DIAGNOSIS — R0600 Dyspnea, unspecified: Secondary | ICD-10-CM

## 2014-02-16 NOTE — Assessment & Plan Note (Signed)
Improving. Edema has decreased since last visit with HCTZ. Increase HCTZ to 50 mg for 4 days and then return to 1 daily. Follow up after 2D echo. Awaiting cardiology consult.

## 2014-02-16 NOTE — Progress Notes (Signed)
Pre visit review using our clinic review tool, if applicable. No additional management support is needed unless otherwise documented below in the visit note. 

## 2014-02-16 NOTE — Progress Notes (Signed)
   Subjective:    Patient ID: Jesse Woods, male    DOB: 03/24/1951, 63 y.o.   MRN: [id number]  Chief Complaint  Patient presents with  . Follow-up    swelling has reduced but no difference in SOB    HPI:  Jesse Woods is a 63 y.o. male who presents today for follow up.  Was recently seen for dyspnea and swelling of his lower extremities. Was placed on HCTZ to help with fluid reduction.   Indicates that the swelling has decreased but still has mild shortness of breath with activity. Going to the mailbox causes him shortness of breath. Does notice it while walking in the house as well. Denies shortness of breath at rest. Uses about 3 pillows at night which is the normal for him. Denies any adverse effects that he notices from the HCTZ.  No Known Allergies  Current Outpatient Prescriptions on File Prior to Visit  Medication Sig Dispense Refill  . aspirin 81 MG tablet Take 81 mg by mouth daily.      Marland Kitchen ezetimibe-simvastatin (VYTORIN) 10-80 MG per tablet take 1 tablet by mouth once daily 30 tablet 5  . hydrochlorothiazide (HYDRODIURIL) 25 MG tablet Take 1 tablet (25 mg total) by mouth daily. 90 tablet 3  . lisinopril (PRINIVIL,ZESTRIL) 20 MG tablet take 1 tablet by mouth twice a day 60 tablet 11  . metFORMIN (GLUCOPHAGE) 1000 MG tablet take 1 tablet by mouth twice a day with meals 180 tablet 3  . Multiple Vitamins-Minerals (MULTIVITAMIN WITH MINERALS) tablet Take 1 tablet by mouth daily.      Marland Kitchen tetrahydrozoline-zinc (VISINE-AC) 0.05-0.25 % ophthalmic solution Place 1 drop into both eyes daily as needed (allergy irritation.).     No current facility-administered medications on file prior to visit.   Review of Systems    See HPI   Objective:    BP 138/90 mmHg  Pulse 98  Temp(Src) 97.9 F (36.6 C) (Oral)  Resp 18  Ht 5\' 8"  (1.727 m)  Wt 195 lb (88.451 kg)  BMI 29.66 kg/m2  SpO2 97% Nursing note and vital signs reviewed.  Physical Exam  Constitutional: He is oriented  to person, place, and time. He appears well-developed and well-nourished. No distress.  Cardiovascular: Normal rate, regular rhythm, normal heart sounds and intact distal pulses.   1+ pitting edema still present in bilateral lower extremities.  Pulmonary/Chest: Effort normal and breath sounds normal.  Neurological: He is alert and oriented to person, place, and time.  Skin: Skin is warm and dry.  Psychiatric: He has a normal mood and affect. His behavior is normal. Judgment and thought content normal.       Assessment & Plan:

## 2014-02-16 NOTE — Assessment & Plan Note (Signed)
Describes shortness of breath with activity. Lungs are clear with no evidence of fluid or wheezes. Increase HCTZ x 4 days to 50 mg and return to 25 mg daily. Follow up pending 2D echo and cardiology consult.

## 2014-02-16 NOTE — Patient Instructions (Signed)
Thank you for choosing [facility].  Summary/Instructions:   Continue the hydrochlorothiazide - for the next 4 days take 2 pills once per day and then return back to 1 pill per day. On days excess fluid take an extra pill.

## 2014-03-15 ENCOUNTER — Encounter: Payer: Self-pay | Admitting: Cardiology

## 2014-03-15 ENCOUNTER — Ambulatory Visit (INDEPENDENT_AMBULATORY_CARE_PROVIDER_SITE_OTHER): Payer: Medicare Other | Admitting: Cardiology

## 2014-03-15 ENCOUNTER — Ambulatory Visit (HOSPITAL_COMMUNITY): Payer: Medicare Other | Attending: Cardiology | Admitting: Cardiology

## 2014-03-15 VITALS — BP 126/74 | HR 90 | Ht 68.0 in | Wt 192.0 lb

## 2014-03-15 DIAGNOSIS — R0600 Dyspnea, unspecified: Secondary | ICD-10-CM

## 2014-03-15 DIAGNOSIS — I10 Essential (primary) hypertension: Secondary | ICD-10-CM

## 2014-03-15 DIAGNOSIS — I609 Nontraumatic subarachnoid hemorrhage, unspecified: Secondary | ICD-10-CM | POA: Insufficient documentation

## 2014-03-15 DIAGNOSIS — M7989 Other specified soft tissue disorders: Secondary | ICD-10-CM | POA: Diagnosis not present

## 2014-03-15 DIAGNOSIS — R0602 Shortness of breath: Secondary | ICD-10-CM | POA: Insufficient documentation

## 2014-03-15 LAB — BRAIN NATRIURETIC PEPTIDE: PRO B NATRI PEPTIDE: 658 pg/mL — AB (ref 0.0–100.0)

## 2014-03-15 NOTE — Patient Instructions (Addendum)
Your physician recommends that you have lab work TODAY (BNP).  We will call you with the results of your lab work and ECHO as soon as they are resulted.

## 2014-03-15 NOTE — Progress Notes (Signed)
Echo performed. 

## 2014-03-15 NOTE — Progress Notes (Signed)
[address], [place], [place]  [address] Phone: [phone] Fax:  [phone]  Date:  03/15/2014   ID:  Jesse Woods, DOB 1951-02-24, MRN [id number]  PCP:  Mauricio Po, FNP  Cardiologist:  Fransico Him, MD    History of Present Illness: Jesse Woods is a 63 y.o. male with a history of type II DM, dyslipidemia and HTN who presents today for evaluation of SOB and LE edema.  He was placed on HCTZ by his PCP and his LE edema improved but SOB persisted.  He has SOB when he walks to the mailbox and with walking in the house.  He denies any chest pain, PND, orthopnea, dizziness, palptiations or syncope.  He sleeps on 3 pillows but for comfort and not orthopnea.  He has an echo scheduled for today.     Wt Readings from Last 3 Encounters:  03/15/14 192 lb (87.091 kg)  02/16/14 195 lb (88.451 kg)  02/09/14 207 lb 12.8 oz (94.257 kg)     Past Medical History  Diagnosis Date  . Diabetes mellitus, type II   . Hyperlipidemia   . Hypertension   . Subarachnoid hemorrhage 1998    from HTN    Current Outpatient Prescriptions  Medication Sig Dispense Refill  . aspirin 81 MG tablet Take 81 mg by mouth daily.      Marland Kitchen ezetimibe-simvastatin (VYTORIN) 10-80 MG per tablet take 1 tablet by mouth once daily 30 tablet 5  . lisinopril (PRINIVIL,ZESTRIL) 20 MG tablet take 1 tablet by mouth twice a day 60 tablet 11  . metFORMIN (GLUCOPHAGE) 1000 MG tablet take 1 tablet by mouth twice a day with meals 180 tablet 3  . Multiple Vitamins-Minerals (MULTIVITAMIN WITH MINERALS) tablet Take 1 tablet by mouth daily.      Marland Kitchen tetrahydrozoline-zinc (VISINE-AC) 0.05-0.25 % ophthalmic solution Place 1 drop into both eyes daily as needed (allergy irritation.).    Marland Kitchen hydrochlorothiazide (HYDRODIURIL) 25 MG tablet Take 1 tablet (25 mg total) by mouth daily. (Patient not taking: Reported on 03/15/2014) 90 tablet 3   No current facility-administered medications for this visit.    Allergies:   No Known  Allergies  Social History:  The patient  reports that he has never smoked. He has never used smokeless tobacco. He reports that he does not drink alcohol or use illicit drugs.   Family History:  The patient's family history includes Cancer in his brother, cousin, father, and mother; Diabetes in his father; Hypertension in his father; Pancreatic cancer in his mother; Prostate cancer in his father and paternal uncle.   ROS:  Please see the history of present illness.      All other systems reviewed and negative.   PHYSICAL EXAM: VS:  BP 126/74 mmHg  Pulse 90  Ht 5\' 8"  (1.727 m)  Wt 192 lb (87.091 kg)  BMI 29.20 kg/m2  SpO2 98% Well nourished, well developed, in no acute distress HEENT: normal Neck: no JVD Cardiac:  normal S1, S2; RRR; no murmur Lungs:  clear to auscultation bilaterally, no wheezing, rhonchi or rales Abd: soft, nontender, no hepatomegaly Ext: no edema Skin: warm and dry Neuro:  CNs 2-12 intact, no focal abnormalities noted      ASSESSMENT AND PLAN:  1. SOB which is primarily DOE.  His exam is normal and has nor rales or edema on exam.  He had his echo done today but results are pending.  I will get a BNP today.  I will  wait for the results of the echo to determine further workup and treatment of SOB.   I will call him with the results of the study. 2. HTN well controlled.  COntinue Lisinopril/HCTZ 3. Type II DM 4. Dyslipidemia 5. 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

## 2014-03-18 ENCOUNTER — Other Ambulatory Visit: Payer: Self-pay | Admitting: *Deleted

## 2014-03-18 ENCOUNTER — Telehealth: Payer: Self-pay | Admitting: Cardiology

## 2014-03-18 DIAGNOSIS — R0602 Shortness of breath: Secondary | ICD-10-CM

## 2014-03-18 MED ORDER — FUROSEMIDE 20 MG PO TABS: ORAL_TABLET | ORAL | Status: DC

## 2014-03-18 NOTE — Telephone Encounter (Signed)
New message  Pt called states he is returning the call

## 2014-03-18 NOTE — Telephone Encounter (Signed)
I spoke with the patient. 

## 2014-03-25 ENCOUNTER — Other Ambulatory Visit: Payer: Medicare Other

## 2014-06-16 ENCOUNTER — Telehealth: Payer: Self-pay | Admitting: Cardiology

## 2014-06-16 NOTE — Telephone Encounter (Signed)
F/U        Pt is returning call from today. Please call.

## 2014-06-16 NOTE — Telephone Encounter (Signed)
Increase Lasix to 20mg  2 tablets daily for 3 days then 1 tablet daily.  He was supposed to have an echo done but nothing in results.  ALso, make sure he is not using added sodium in his diet.  He needs to follow a 2gm sodium diet

## 2014-06-16 NOTE — Telephone Encounter (Signed)
Follow up      Call pt back on his cell phone when you call him back---[phone]

## 2014-06-16 NOTE — Telephone Encounter (Signed)
Instructed patient to INCREASE lasix to 40 mg for three days and to follow low salt diet. Patient agrees with treatment plan.

## 2014-06-16 NOTE — Telephone Encounter (Signed)
New Msg     Pt c/o swelling: STAT is pt has developed SOB within 24 hours  1. How long have you been experiencing swelling? Last 72 hrs  2. Where is the swelling located? Swelling of feet   3.  Are you currently taking a "fluid pill"? Yes, once daily as normal  4.  Are you currently SOB? Yes, only with exertion. Past 72 hrs it developed.   5.  Have you traveled recently? Yes to [place], [place].   Please return pt call.

## 2014-06-16 NOTE — Telephone Encounter (Signed)
Patient c/o increased swelling BLE over the last three days. He st he has slight shortness of breath, but this is not new or acute or worse since his swelling. Patient has no other complaints. He has been taking his Lasix 20 mg daily as directed and has taken it today.  To Dr. Radford Pax for recommendations.

## 2014-06-21 ENCOUNTER — Telehealth: Payer: Self-pay

## 2014-06-21 ENCOUNTER — Encounter (HOSPITAL_COMMUNITY): Payer: Self-pay | Admitting: Cardiology

## 2014-06-21 NOTE — Telephone Encounter (Signed)
ECHO report retrieved from [place]. Requested to be scanned into EPIC.  Report given to Dr. Radford Pax for review.

## 2014-06-21 NOTE — Telephone Encounter (Signed)
Per Dr. Radford Pax, called patient and scheduled an OV tomorrow to discuss ECHO and medications.  Patient agrees with treatment plan.

## 2014-06-22 ENCOUNTER — Encounter: Payer: Self-pay | Admitting: Cardiology

## 2014-06-22 ENCOUNTER — Ambulatory Visit (INDEPENDENT_AMBULATORY_CARE_PROVIDER_SITE_OTHER): Payer: Commercial Managed Care - HMO | Admitting: Cardiology

## 2014-06-22 ENCOUNTER — Other Ambulatory Visit: Payer: Self-pay | Admitting: Cardiology

## 2014-06-22 ENCOUNTER — Other Ambulatory Visit: Payer: Self-pay

## 2014-06-22 VITALS — BP 130/88 | HR 91 | Ht 68.0 in | Wt 199.8 lb

## 2014-06-22 DIAGNOSIS — I10 Essential (primary) hypertension: Secondary | ICD-10-CM

## 2014-06-22 DIAGNOSIS — I5022 Chronic systolic (congestive) heart failure: Secondary | ICD-10-CM | POA: Diagnosis not present

## 2014-06-22 DIAGNOSIS — Z01812 Encounter for preprocedural laboratory examination: Secondary | ICD-10-CM

## 2014-06-22 DIAGNOSIS — I255 Ischemic cardiomyopathy: Secondary | ICD-10-CM | POA: Insufficient documentation

## 2014-06-22 DIAGNOSIS — I420 Dilated cardiomyopathy: Secondary | ICD-10-CM

## 2014-06-22 DIAGNOSIS — R0602 Shortness of breath: Secondary | ICD-10-CM | POA: Diagnosis not present

## 2014-06-22 HISTORY — DX: Dilated cardiomyopathy: I42.0

## 2014-06-22 LAB — PROTIME-INR
INR: 1.4 ratio — ABNORMAL HIGH (ref 0.8–1.0)
Prothrombin Time: 15 s — ABNORMAL HIGH (ref 9.6–13.1)

## 2014-06-22 LAB — CBC WITH DIFFERENTIAL/PLATELET
BASOS PCT: 0.6 % (ref 0.0–3.0)
Basophils Absolute: 0 10*3/uL (ref 0.0–0.1)
EOS ABS: 0.1 10*3/uL (ref 0.0–0.7)
Eosinophils Relative: 1 % (ref 0.0–5.0)
HCT: 37 % — ABNORMAL LOW (ref 39.0–52.0)
Hemoglobin: 11.6 g/dL — ABNORMAL LOW (ref 13.0–17.0)
Lymphocytes Relative: 14.7 % (ref 12.0–46.0)
Lymphs Abs: 0.8 10*3/uL (ref 0.7–4.0)
MCHC: 31.3 g/dL (ref 30.0–36.0)
MCV: 85.2 fl (ref 78.0–100.0)
Monocytes Absolute: 0.4 10*3/uL (ref 0.1–1.0)
Monocytes Relative: 6.3 % (ref 3.0–12.0)
NEUTROS PCT: 77.4 % — AB (ref 43.0–77.0)
Neutro Abs: 4.4 10*3/uL (ref 1.4–7.7)
Platelets: 278 10*3/uL (ref 150.0–400.0)
RBC: 4.35 Mil/uL (ref 4.22–5.81)
RDW: 17 % — ABNORMAL HIGH (ref 11.5–15.5)
WBC: 5.7 10*3/uL (ref 4.0–10.5)

## 2014-06-22 LAB — BASIC METABOLIC PANEL
BUN: 18 mg/dL (ref 6–23)
CO2: 30 mEq/L (ref 19–32)
CREATININE: 1.27 mg/dL (ref 0.40–1.50)
Calcium: 9.7 mg/dL (ref 8.4–10.5)
Chloride: 105 mEq/L (ref 96–112)
GFR: 73.07 mL/min (ref >60.00)
Glucose, Bld: 119 mg/dL — ABNORMAL HIGH (ref 70–99)
Potassium: 4.4 mEq/L (ref 3.5–5.1)
SODIUM: 138 meq/L (ref 135–145)

## 2014-06-22 LAB — APTT: APTT: 35 s — AB (ref 23.4–32.7)

## 2014-06-22 MED ORDER — SPIRONOLACTONE 25 MG PO TABS: 12.5000 mg | ORAL_TABLET | Freq: Every day | ORAL | Status: DC

## 2014-06-22 MED ORDER — LISINOPRIL 20 MG PO TABS: 20.0000 mg | ORAL_TABLET | Freq: Two times a day (BID) | ORAL | Status: DC

## 2014-06-22 MED ORDER — CARVEDILOL 3.125 MG PO TABS: 3.1250 mg | ORAL_TABLET | Freq: Two times a day (BID) | ORAL | Status: DC

## 2014-06-22 NOTE — Patient Instructions (Addendum)
Your physician has recommended you make the following change in your medication:  1) START COREG 3.125 mg twice daily 2) START ALDACTONE 12.5 mg daily  Your physician recommends that you have pre-procedure lab work TODAY.  Your physician has requested that you have an echocardiogram IN THREE MONTHS. Echocardiography is a painless test that uses sound waves to create images of your heart. It provides your doctor with information about the size and shape of your heart and how well your heart's chambers and valves are working. This procedure takes approximately one hour. There are no restrictions for this procedure.  Your physician has requested that you have a cardiac catheterization. Cardiac catheterization is used to diagnose and/or treat various heart conditions. Doctors may recommend this procedure for a number of different reasons. The most common reason is to evaluate chest pain. Chest pain can be a symptom of coronary artery disease (CAD), and cardiac catheterization can show whether plaque is narrowing or blocking your heart's arteries. This procedure is also used to evaluate the valves, as well as measure the blood flow and oxygen levels in different parts of your heart. For further information please visit [email or web address]. Please follow instruction sheet, as given.  Your physician recommends that you schedule a follow-up appointment in: 1 month with Dr. Radford Pax.

## 2014-06-22 NOTE — Progress Notes (Signed)
Cardiology Office Note   Date:  06/22/2014   ID:  Woods BEASLEY, DOB [birth year], MRN [id number]  PCP:  Jesse Po, FNP  Cardiologist:   Jesse Margarita, MD   No chief complaint on file.     History of Present Illness: Jesse Woods is a 64 y.o. male with a history of type II DM, dyslipidemia and HTN who presents today for followup of SOB and LE edema. He was placed on HCTZ by his PCP and his LE edema improved but SOB persisted. He has SOB when he walks to the mailbox and with walking in the house. He denies any chest pain, PND, orthopnea, dizziness, palptiations or syncope. He has some LE edema.  He sleeps on 3 pillows but for comfort and not orthopnea.He denies any orthopnea or PND.   He had an echo done recently which showed severely dilated LV with EF 15%.      Past Medical History  Diagnosis Date  . Diabetes mellitus, type II   . Hyperlipidemia   . Hypertension   . Subarachnoid hemorrhage 1998    from HTN  . DCM (dilated cardiomyopathy) 06/22/2014    EF 15%    Past Surgical History  Procedure Laterality Date  . Brain surgery  1998    right frontal ventriculotomy with subsequent ventriculo-abfdominal shunt due to [facility]  . Hernia repair  1954    right     Current Outpatient Prescriptions  Medication Sig Dispense Refill  . aspirin 81 MG tablet Take 81 mg by mouth daily.      Marland Kitchen ezetimibe-simvastatin (VYTORIN) 10-80 MG per tablet take 1 tablet by mouth once daily 30 tablet 5  . furosemide (LASIX) 20 MG tablet Take one tablet by mouth twice daily x 3 days, then decrease to one tablet daily 30 tablet 3  . lisinopril (PRINIVIL,ZESTRIL) 20 MG tablet take 1 tablet by mouth twice a day 60 tablet 11  . metFORMIN (GLUCOPHAGE) 1000 MG tablet take 1 tablet by mouth twice a day with meals 180 tablet 3  . Multiple Vitamins-Minerals (MULTIVITAMIN WITH MINERALS) tablet Take 1 tablet by mouth daily.      Marland Kitchen tetrahydrozoline-zinc (VISINE-AC) 0.05-0.25 % ophthalmic solution  Place 1 drop into both eyes daily as needed (allergy irritation.).    Marland Kitchen carvedilol (COREG) 3.125 MG tablet Take 1 tablet (3.125 mg total) by mouth 2 (two) times daily. 60 tablet 3  . spironolactone (ALDACTONE) 25 MG tablet Take 0.5 tablets (12.5 mg total) by mouth daily. 30 tablet 3   No current facility-administered medications for this visit.    Allergies:   Review of patient's allergies indicates no known allergies.    Social History:  The patient  reports that he has never smoked. He has never used smokeless tobacco. He reports that he does not drink alcohol or use illicit drugs.   Family History:  The patient's family history includes Cancer in his brother, cousin, father, and mother; Diabetes in his father; Hypertension in his father; Pancreatic cancer in his mother; Prostate cancer in his father and paternal uncle.    ROS:  Please see the history of present illness.   Otherwise, review of systems are positive for none.   All other systems are reviewed and negative.    PHYSICAL EXAM: VS:  BP 130/88 mmHg  Pulse 91  Ht 5\' 8"  (1.727 m)  Wt 199 lb 12.8 oz (90.629 kg)  BMI 30.39 kg/m2  SpO2 97% , BMI Body mass index is  30.39 kg/(m^2). GEN: Well nourished, well developed, in no acute distress HEENT: normal Neck: no JVD, carotid bruits, or masses Cardiac: RRR; no murmurs, rubs, or gallops.  Trace edema Respiratory:  clear to auscultation bilaterally, normal work of breathing GI: soft, nontender, nondistended, + BS MS: no deformity or atrophy Skin: warm and dry, no rash Neuro:  Strength and sensation are intact Psych: euthymic mood, full affect   EKG:  EKG is not ordered today.    Recent Labs: 02/09/2014: BUN 26*; Creatinine 1.3; Hemoglobin 12.1*; Platelets 295.0; Potassium 4.7; Sodium 141 03/15/2014: Pro B Natriuretic peptide (BNP) 658.0*    Lipid Panel    Component Value Date/Time   CHOL 186 04/28/2012 1427   TRIG 178.0* 04/28/2012 1427   HDL 30.40* 04/28/2012 1427    CHOLHDL 6 04/28/2012 1427   VLDL 35.6 04/28/2012 1427   LDLCALC 120* 04/28/2012 1427   LDLDIRECT 215.3 01/03/2011 1035      Wt Readings from Last 3 Encounters:  06/22/14 199 lb 12.8 oz (90.629 kg)  03/15/14 192 lb (87.091 kg)  02/16/14 195 lb (88.451 kg)      Other studies Reviewed: Additional studies/ records that were reviewed today include: 2D echo. Review of the above records demonstrates: severe LV dysfunction with EF 15%   ASSESSMENT AND PLAN:  1. SOB which is primarily DOE. His exam is normal and has nor rales or edema on exam. 2D echo showed severe LV dysfunction with EF 15%.   2. HTN well controlled. Continue Lisinopril/HCTZ 3.    Dilated CM of ? Etiology - on review of echo EF is 15% with only the anteroseptum contracting.  ? Hypertensive CM vs. Viral vs. Idiopathic vs. Ischemic.  I have recommended that we proceed with left heart cath to assess for CAD.  Cardiac catheterization was discussed with the patient fully including risks on myocardial infarction, death, stroke, bleeding, arrhythmia, dye allergy, renal insufficiency or bleeding.  All patient questions and concerns were discussed and the patient understands and is willing to proceed.   3. Chronic systolic CHF - appears euvolemic on exam today with only trace edema.  Will continue ACE/Lasix.  Add coreg 3.125mg  BID.  Add aldactone 12.5mg  daily.   4. Type II DM 5. Dyslipidemia 6. Remote subarachnoid hemorrhage from HTN   Current medicines are reviewed at length with the patient today.  The patient does not have concerns regarding medicines.  The following changes have been made:  Add coreg 3.125mg  BID and aldactone 12.5mg  daily  Labs/ tests ordered today include: left heart cath   Orders Placed This Encounter  Procedures  . INR/PT  . PTT  . Basic Metabolic Panel (BMET)  . CBC w/Diff  . 2D Echocardiogram without contrast     Disposition:   FU with me in 4 weeks   Signed, Jesse Margarita, MD    06/22/2014 11:34 AM    [facility] [place], [place], [place]  [address] Phone: [phone]; Fax: [phone]

## 2014-06-24 ENCOUNTER — Ambulatory Visit (HOSPITAL_COMMUNITY)
Admission: RE | Admit: 2014-06-24 | Discharge: 2014-06-24 | Disposition: A | Discharge status: Home / Self Care | Payer: Commercial Managed Care - HMO | Source: Ambulatory Visit | Attending: Interventional Cardiology | Admitting: Interventional Cardiology

## 2014-06-24 ENCOUNTER — Encounter (HOSPITAL_COMMUNITY): Payer: Self-pay | Admitting: Interventional Cardiology

## 2014-06-24 ENCOUNTER — Encounter (HOSPITAL_COMMUNITY)
Admission: RE | Disposition: A | Discharge status: Home / Self Care | Payer: Self-pay | Source: Ambulatory Visit | Attending: Interventional Cardiology

## 2014-06-24 ENCOUNTER — Telehealth: Payer: Self-pay | Admitting: Cardiology

## 2014-06-24 DIAGNOSIS — I2582 Chronic total occlusion of coronary artery: Secondary | ICD-10-CM | POA: Diagnosis not present

## 2014-06-24 DIAGNOSIS — Z79899 Other long term (current) drug therapy: Secondary | ICD-10-CM | POA: Diagnosis not present

## 2014-06-24 DIAGNOSIS — R0602 Shortness of breath: Secondary | ICD-10-CM

## 2014-06-24 DIAGNOSIS — E119 Type 2 diabetes mellitus without complications: Secondary | ICD-10-CM | POA: Insufficient documentation

## 2014-06-24 DIAGNOSIS — Z7982 Long term (current) use of aspirin: Secondary | ICD-10-CM | POA: Diagnosis not present

## 2014-06-24 DIAGNOSIS — I2510 Atherosclerotic heart disease of native coronary artery without angina pectoris: Secondary | ICD-10-CM | POA: Insufficient documentation

## 2014-06-24 DIAGNOSIS — I5022 Chronic systolic (congestive) heart failure: Secondary | ICD-10-CM | POA: Insufficient documentation

## 2014-06-24 DIAGNOSIS — I420 Dilated cardiomyopathy: Secondary | ICD-10-CM | POA: Insufficient documentation

## 2014-06-24 DIAGNOSIS — I10 Essential (primary) hypertension: Secondary | ICD-10-CM | POA: Insufficient documentation

## 2014-06-24 DIAGNOSIS — E785 Hyperlipidemia, unspecified: Secondary | ICD-10-CM | POA: Insufficient documentation

## 2014-06-24 HISTORY — PX: CARDIAC CATHETERIZATION: SHX172

## 2014-06-24 HISTORY — PX: LEFT HEART CATHETERIZATION WITH CORONARY ANGIOGRAM: SHX5451

## 2014-06-24 LAB — GLUCOSE, CAPILLARY
GLUCOSE-CAPILLARY: 116 mg/dL — AB (ref 70–99)
Glucose-Capillary: 129 mg/dL — ABNORMAL HIGH (ref 70–99)

## 2014-06-24 SURGERY — LEFT HEART CATHETERIZATION WITH CORONARY ANGIOGRAM
Anesthesia: LOCAL

## 2014-06-24 MED ORDER — SODIUM CHLORIDE 0.9 % IV SOLN: 250.0000 mL | INTRAVENOUS | Status: DC | PRN

## 2014-06-24 MED ORDER — SODIUM CHLORIDE 0.9 % IV SOLN: INTRAVENOUS | Status: DC

## 2014-06-24 MED ORDER — ASPIRIN 81 MG PO CHEW
CHEWABLE_TABLET | ORAL | Status: AC
  Administered 2014-06-24: 81 mg
  Filled 2014-06-24: qty 1

## 2014-06-24 MED ORDER — SODIUM CHLORIDE 0.9 % IJ SOLN: 3.0000 mL | Freq: Two times a day (BID) | INTRAMUSCULAR | Status: DC

## 2014-06-24 MED ORDER — LIDOCAINE HCL (PF) 1 % IJ SOLN
INTRAMUSCULAR | Status: AC
  Filled 2014-06-24: qty 30

## 2014-06-24 MED ORDER — NITROGLYCERIN 1 MG/10 ML FOR IR/CATH LAB
INTRA_ARTERIAL | Status: AC
  Filled 2014-06-24: qty 10

## 2014-06-24 MED ORDER — MIDAZOLAM HCL 2 MG/2ML IJ SOLN
INTRAMUSCULAR | Status: AC
  Filled 2014-06-24: qty 2

## 2014-06-24 MED ORDER — METFORMIN HCL 1000 MG PO TABS
1000.0000 mg | ORAL_TABLET | Freq: Two times a day (BID) | ORAL | Status: DC
Start: 2014-06-26 — End: 2015-08-22

## 2014-06-24 MED ORDER — HEPARIN (PORCINE) IN NACL 2-0.9 UNIT/ML-% IJ SOLN
INTRAMUSCULAR | Status: AC
  Filled 2014-06-24: qty 1000

## 2014-06-24 MED ORDER — FUROSEMIDE 40 MG PO TABS
ORAL_TABLET | ORAL | Status: DC
Start: 2014-06-24 — End: 2014-12-06

## 2014-06-24 MED ORDER — SODIUM CHLORIDE 0.9 % IJ SOLN: 3.0000 mL | INTRAMUSCULAR | Status: DC | PRN

## 2014-06-24 MED ORDER — HEPARIN SODIUM (PORCINE) 1000 UNIT/ML IJ SOLN
INTRAMUSCULAR | Status: AC
  Filled 2014-06-24: qty 1

## 2014-06-24 MED ORDER — ASPIRIN 81 MG PO CHEW: 81.0000 mg | CHEWABLE_TABLET | ORAL | Status: DC

## 2014-06-24 MED ORDER — VERAPAMIL HCL 2.5 MG/ML IV SOLN
INTRAVENOUS | Status: AC
  Filled 2014-06-24: qty 2

## 2014-06-24 MED ORDER — FENTANYL CITRATE 0.05 MG/ML IJ SOLN
INTRAMUSCULAR | Status: AC
  Filled 2014-06-24: qty 2

## 2014-06-24 MED ORDER — SODIUM CHLORIDE 0.9 % IV SOLN
INTRAVENOUS | Status: DC
  Administered 2014-06-24: 09:00:00 via INTRAVENOUS

## 2014-06-24 NOTE — Telephone Encounter (Signed)
Please find out who patient's neurosurgeon is who follows his VP shunt

## 2014-06-24 NOTE — CV Procedure (Signed)
       PROCEDURE:  Left heart catheterization with selective coronary angiography.  INDICATIONS:  Cardiomyopathy, shortness of breath  The risks, benefits, and details of the procedure were explained to the patient.  The patient verbalized understanding and wanted to proceed.  Informed written consent was obtained.  PROCEDURE TECHNIQUE:  After Xylocaine anesthesia a 42F slender sheath was placed in the right radial artery with a single anterior needle wall stick.   IV Heparin was given.  Right coronary angiography was done using an AL 2 guide catheter.  Left coronary angiography was done using a Judkins L3.5 guide catheter.  Left ventriculography was not done.  Left heart catheterization was done using an AL 2 catheter.  A TR band was used for hemostasis.   CONTRAST:  Total of 60 cc.  COMPLICATIONS:  None.    HEMODYNAMICS:  Aortic pressure was 129/82; LV pressure was 127/11; LVEDP 30.  There was no gradient between the left ventricle and aorta.    ANGIOGRAPHIC DATA:   The left main coronary artery is widely patent.  The left anterior descending artery is a large vessel which wraps around the apex. There is mild diffuse disease. There is a large first diagonal which has mild proximal disease. The remainder of the LAD has mild diffuse disease.  The left circumflex artery is  a large vessel proximally. In the mid vessel, there is an occlusion. The distal circumflex fills from left to left collaterals. There are some small bridging collaterals as well.  The right coronary artery is a large, dominant vessel. There is a high anterior takeoff requiring an AL 2 guide to engage the vessel. In the proximal to mid vessel, there is an area of moderate disease. This does become very severe, up to 95% just before an RV marginal branch.Marland Kitchen  LEFT VENTRICULOGRAM:  Left ventricular angiogram was not done.  LVEDP was 30 mmHg.  IMPRESSIONS:  1. Normal left main coronary artery. 2. Mild disease in the  left  anterior descending artery and its branches. 3. 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 Severe disease in the mid right coronary artery. 5. Left ventricular systolic function not assessed.  LVEDP 30 mmHg.   RECOMMENDATION:  His right coronary artery would certainly be a candidate for intervention. We would have to have clearance for committing him to dual antiplatelet therapy. He has a history of subarachnoid hemorrhage. I discussed this with Dr. Radford Pax and we will intensify medical therapy at this point. Given that he is volume overloaded, will increase his furosemide to 40 mg by mouth twice a day for 3 days and then reduce to 40 mg daily. He will follow-up with Dr. Radford Pax and PCI will be considered at a later time. Of note, his mid circumflex may also be a reasonable candidate for CTO PCI given that it is a short occlusion.

## 2014-06-24 NOTE — Interval H&P Note (Signed)
Cath Lab Visit (complete for each Cath Lab visit)  Clinical Evaluation Leading to the Procedure:   ACS: No.  Non-ACS:    Anginal Classification: CCS III  Anti-ischemic medical therapy: Minimal Therapy (1 class of medications)  Non-Invasive Test Results: High-risk stress test findings: cardiac mortality >3%/year  Prior CABG: No previous CABG   Ischemic Symptoms? CCS III (Marked limitation of ordinary activity) Anti-ischemic Medical Therapy? Minimal Therapy (1 class of medications) Non-invasive Test Results? High-risk stress test findings: cardiac mortality >3%/yr Prior CABG? No Previous CABG   Patient Information:   1-2V CAD, no prox LAD  A (8)  Indication: 18; Score: 8   Patient Information:   CTO of 1 vessel, no other CAD  A (7)  Indication: 28; Score: 7   Patient Information:   1V CAD with prox LAD  A (9)  Indication: 34; Score: 9   Patient Information:   2V-CAD with prox LAD  A (9)  Indication: 40; Score: 9   Patient Information:   3V-CAD without LMCA  A (9)  Indication: 46; Score: 9   Patient Information:   3V-CAD without LMCA With Abnormal LV systolic function  A (9)  Indication: 48; Score: 9   Patient Information:   LMCA-CAD  A (9)  Indication: 49; Score: 9   Patient Information:   2V-CAD with prox LAD PCI  A (7)  Indication: 62; Score: 7   Patient Information:   2V-CAD with prox LAD CABG  A (8)  Indication: 62; Score: 8   Patient Information:   3V-CAD without LMCA With Low CAD burden(i.e., 3 focal stenoses, low SYNTAX score) PCI  A (7)  Indication: 63; Score: 7   Patient Information:   3V-CAD without LMCA With Low CAD burden(i.e., 3 focal stenoses, low SYNTAX score) CABG  A (9)  Indication: 63; Score: 9   Patient Information:   3V-CAD without LMCA E06c - Intermediate-high CAD burden (i.e., multiple diffuse lesions, presence of CTO, or high SYNTAX score) PCI  U (4)  Indication: 64; Score:  4   Patient Information:   3V-CAD without LMCA E06c - Intermediate-high CAD burden (i.e., multiple diffuse lesions, presence of CTO, or high SYNTAX score) CABG  A (9)  Indication: 64; Score: 9   Patient Information:   LMCA-CAD With Isolated LMCA stenosis  PCI  U (6)  Indication: 65; Score: 6   Patient Information:   LMCA-CAD With Isolated LMCA stenosis  CABG  A (9)  Indication: 65; Score: 9   Patient Information:   LMCA-CAD Additional CAD, low CAD burden (i.e., 1- to 2-vessel additional involvement, low SYNTAX score) PCI  U (5)  Indication: 66; Score: 5   Patient Information:   LMCA-CAD Additional CAD, low CAD burden (i.e., 1- to 2-vessel additional involvement, low SYNTAX score) CABG  A (9)  Indication: 66; Score: 9   Patient Information:   LMCA-CAD Additional CAD, intermediate-high CAD burden (i.e., 3-vessel involvement, presence of CTO, or high SYNTAX score) PCI  I (3)  Indication: 67; Score: 3   Patient Information:   LMCA-CAD Additional CAD, intermediate-high CAD burden (i.e., 3-vessel involvement, presence of CTO, or high SYNTAX score) CABG  A (9)  Indication: 67; Score: 9    History and Physical Interval Note:  06/24/2014 11:13 AM  Jesse Woods  has presented today for surgery, with the diagnosis of SOB  The various methods of treatment have been discussed with the patient and family. After consideration of risks, benefits and other options for treatment,  the patient has consented to  Procedure(s): LEFT HEART CATHETERIZATION WITH CORONARY ANGIOGRAM (N/A) as a surgical intervention .  The patient's history has been reviewed, patient examined, no change in status, stable for surgery.  I have reviewed the patient's chart and labs.  Questions were answered to the patient's satisfaction.     Jesse Jahr S.

## 2014-06-24 NOTE — H&P (View-Only) (Signed)
Cardiology Office Note   Date:  06/22/2014   ID:  Jesse Woods, DOB 1950/10/01, MRN [id number]  PCP:  Mauricio Po, FNP  Cardiologist:   [name], MD   No chief complaint on file.     History of Present Illness: Jesse Woods is a 64 y.o. male with a history of type II DM, dyslipidemia and HTN who presents today for followup of SOB and LE edema. He was placed on HCTZ by his PCP and his LE edema improved but SOB persisted. He has SOB when he walks to the mailbox and with walking in the house. He denies any chest pain, PND, orthopnea, dizziness, palptiations or syncope. He has some LE edema.  He sleeps on 3 pillows but for comfort and not orthopnea.He denies any orthopnea or PND.   He had an echo done recently which showed severely dilated LV with EF 15%.      Past Medical History  Diagnosis Date  . Diabetes mellitus, type II   . Hyperlipidemia   . Hypertension   . Subarachnoid hemorrhage 1998    from HTN  . DCM (dilated cardiomyopathy) 06/22/2014    EF 15%    Past Surgical History  Procedure Laterality Date  . Brain surgery  1998    right frontal ventriculotomy with subsequent ventriculo-abfdominal shunt due to [facility]  . Hernia repair  1954    right     Current Outpatient Prescriptions  Medication Sig Dispense Refill  . aspirin 81 MG tablet Take 81 mg by mouth daily.      Marland Kitchen ezetimibe-simvastatin (VYTORIN) 10-80 MG per tablet take 1 tablet by mouth once daily 30 tablet 5  . furosemide (LASIX) 20 MG tablet Take one tablet by mouth twice daily x 3 days, then decrease to one tablet daily 30 tablet 3  . lisinopril (PRINIVIL,ZESTRIL) 20 MG tablet take 1 tablet by mouth twice a day 60 tablet 11  . metFORMIN (GLUCOPHAGE) 1000 MG tablet take 1 tablet by mouth twice a day with meals 180 tablet 3  . Multiple Vitamins-Minerals (MULTIVITAMIN WITH MINERALS) tablet Take 1 tablet by mouth daily.      Marland Kitchen tetrahydrozoline-zinc (VISINE-AC) 0.05-0.25 % ophthalmic solution  Place 1 drop into both eyes daily as needed (allergy irritation.).    Marland Kitchen carvedilol (COREG) 3.125 MG tablet Take 1 tablet (3.125 mg total) by mouth 2 (two) times daily. 60 tablet 3  . spironolactone (ALDACTONE) 25 MG tablet Take 0.5 tablets (12.5 mg total) by mouth daily. 30 tablet 3   No current facility-administered medications for this visit.    Allergies:   Review of patient's allergies indicates no known allergies.    Social History:  The patient  reports that he has never smoked. He has never used smokeless tobacco. He reports that he does not drink alcohol or use illicit drugs.   Family History:  The patient's family history includes Cancer in his brother, cousin, father, and mother; Diabetes in his father; Hypertension in his father; Pancreatic cancer in his mother; Prostate cancer in his father and paternal uncle.    ROS:  Please see the history of present illness.   Otherwise, review of systems are positive for none.   All other systems are reviewed and negative.    PHYSICAL EXAM: VS:  BP 130/88 mmHg  Pulse 91  Ht 5\' 8"  (1.727 m)  Wt 199 lb 12.8 oz (90.629 kg)  BMI 30.39 kg/m2  SpO2 97% , BMI Body mass index is  30.39 kg/(m^2). GEN: Well nourished, well developed, in no acute distress HEENT: normal Neck: no JVD, carotid bruits, or masses Cardiac: RRR; no murmurs, rubs, or gallops.  Trace edema Respiratory:  clear to auscultation bilaterally, normal work of breathing GI: soft, nontender, nondistended, + BS MS: no deformity or atrophy Skin: warm and dry, no rash Neuro:  Strength and sensation are intact Psych: euthymic mood, full affect   EKG:  EKG is not ordered today.    Recent Labs: 02/09/2014: BUN 26*; Creatinine 1.3; Hemoglobin 12.1*; Platelets 295.0; Potassium 4.7; Sodium 141 03/15/2014: Pro B Natriuretic peptide (BNP) 658.0*    Lipid Panel    Component Value Date/Time   CHOL 186 04/28/2012 1427   TRIG 178.0* 04/28/2012 1427   HDL 30.40* 04/28/2012 1427    CHOLHDL 6 04/28/2012 1427   VLDL 35.6 04/28/2012 1427   LDLCALC 120* 04/28/2012 1427   LDLDIRECT 215.3 01/03/2011 1035      Wt Readings from Last 3 Encounters:  06/22/14 199 lb 12.8 oz (90.629 kg)  03/15/14 192 lb (87.091 kg)  02/16/14 195 lb (88.451 kg)      Other studies Reviewed: Additional studies/ records that were reviewed today include: 2D echo. Review of the above records demonstrates: severe LV dysfunction with EF 15%   ASSESSMENT AND PLAN:  1. SOB which is primarily DOE. His exam is normal and has nor rales or edema on exam. 2D echo showed severe LV dysfunction with EF 15%.   2. HTN well controlled. Continue Lisinopril/HCTZ 3.    Dilated CM of ? Etiology - on review of echo EF is 15% with only the anteroseptum contracting.  ? Hypertensive CM vs. Viral vs. Idiopathic vs. Ischemic.  I have recommended that we proceed with left heart cath to assess for CAD.  Cardiac catheterization was discussed with the patient fully including risks on myocardial infarction, death, stroke, bleeding, arrhythmia, dye allergy, renal insufficiency or bleeding.  All patient questions and concerns were discussed and the patient understands and is willing to proceed.   3. Chronic systolic CHF - appears euvolemic on exam today with only trace edema.  Will continue ACE/Lasix.  Add coreg 3.125mg  BID.  Add aldactone 12.5mg  daily.   4. Type II DM 5. Dyslipidemia 6. Remote subarachnoid hemorrhage from HTN   Current medicines are reviewed at length with the patient today.  The patient does not have concerns regarding medicines.  The following changes have been made:  Add coreg 3.125mg  BID and aldactone 12.5mg  daily  Labs/ tests ordered today include: left heart cath   Orders Placed This Encounter  Procedures  . INR/PT  . PTT  . Basic Metabolic Panel (BMET)  . CBC w/Diff  . 2D Echocardiogram without contrast     Disposition:   FU with me in 4 weeks   Signed, [name], MD    06/22/2014 11:34 AM    [facility] [place], [place], [place]  [address] Phone: [phone]; Fax: [phone]

## 2014-06-24 NOTE — Discharge Instructions (Signed)
°  HOLD METFORMIN FOR 48 HOURS  Radial Site Care Refer to this sheet in the next few weeks. These instructions provide you with information on caring for yourself after your procedure. Your caregiver may also give you more specific instructions. Your treatment has been planned according to current medical practices, but problems sometimes occur. Call your caregiver if you have any problems or questions after your procedure. HOME CARE INSTRUCTIONS  You may shower the day after the procedure.Remove the bandage (dressing) and gently wash the site with plain soap and water.Gently pat the site dry.  Do not apply powder or lotion to the site.  Do not submerge the affected site in water for 3 to 5 days.  Inspect the site at least twice daily.  Do not flex or bend the affected arm for 24 hours.  No lifting over 5 pounds (2.3 kg) for 5 days after your procedure.  Do not drive home if you are discharged the same day of the procedure. Have someone else drive you.  You may drive 24 hours after the procedure unless otherwise instructed by your caregiver.  Do not operate machinery or power tools for 24 hours.  A responsible adult should be with you for the first 24 hours after you arrive home. What to expect:  Any bruising will usually fade within 1 to 2 weeks.  Blood that collects in the tissue (hematoma) may be painful to the touch. It should usually decrease in size and tenderness within 1 to 2 weeks. SEEK IMMEDIATE MEDICAL CARE IF:  You have unusual pain at the radial site.  You have redness, warmth, swelling, or pain at the radial site.  You have drainage (other than a small amount of blood on the dressing).  You have chills.  You have a fever or persistent symptoms for more than 72 hours.  You have a fever and your symptoms suddenly get worse.  Your arm becomes pale, cool, tingly, or numb.  You have heavy bleeding from the site. Hold pressure on the site. Document Released:  04/27/2010 Document Revised: 06/17/2011 Document Reviewed: 04/27/2010 [facility] Patient Information 2015 [place], [place]. This information is not intended to replace advice given to you by your health care provider. Make sure you discuss any questions you have with your health care provider.

## 2014-06-27 ENCOUNTER — Encounter: Payer: Self-pay | Admitting: Cardiology

## 2014-06-27 NOTE — Telephone Encounter (Signed)
New Msg       Pt states he is returning call from a few minutes ago.    Please return pt call.

## 2014-06-27 NOTE — Telephone Encounter (Signed)
Left message to call back  

## 2014-06-27 NOTE — Telephone Encounter (Signed)
Patient st Dr. Earle Gell was his neurosurgeon. He does not follow him anymore. He had the surgery in the 90's and after a few years he st he did not need to see them anymore.

## 2014-06-27 NOTE — Telephone Encounter (Signed)
This encounter was created in error - please disregard.

## 2014-06-27 NOTE — Telephone Encounter (Signed)
Patient has CAD and would benefit from PCI but would need to be on anticoagulation with ASA and Plavix for a year.  He has a history of remote subarachnoid hemorrhage treated by you in the 90's.  Do you think he would be able to be on anticoagulation long term with dual antiplatelet therapy?  He also would have to have IV anticoagulation during the PCI.

## 2014-07-22 ENCOUNTER — Other Ambulatory Visit: Payer: Self-pay | Admitting: Family

## 2014-07-22 DIAGNOSIS — R0600 Dyspnea, unspecified: Secondary | ICD-10-CM

## 2014-07-22 DIAGNOSIS — M7989 Other specified soft tissue disorders: Secondary | ICD-10-CM

## 2014-08-18 ENCOUNTER — Encounter: Payer: Self-pay | Admitting: Gastroenterology

## 2014-08-20 NOTE — Progress Notes (Signed)
This encounter was created in error - please disregard.

## 2014-08-22 ENCOUNTER — Encounter: Payer: Commercial Managed Care - HMO | Admitting: Cardiology

## 2014-08-30 ENCOUNTER — Other Ambulatory Visit: Payer: Self-pay

## 2014-08-30 MED ORDER — EZETIMIBE-SIMVASTATIN 10-80 MG PO TABS: ORAL_TABLET | ORAL | Status: DC

## 2014-09-26 ENCOUNTER — Other Ambulatory Visit (HOSPITAL_COMMUNITY): Payer: TRICARE For Life (TFL)

## 2014-11-03 ENCOUNTER — Other Ambulatory Visit (HOSPITAL_COMMUNITY): Payer: TRICARE For Life (TFL)

## 2014-11-07 DIAGNOSIS — I2510 Atherosclerotic heart disease of native coronary artery without angina pectoris: Secondary | ICD-10-CM | POA: Insufficient documentation

## 2014-11-07 NOTE — Progress Notes (Signed)
Cardiology Office Note   Date:  11/08/2014   ID:  Jesse Woods, DOB 07/23/1950, MRN [id number]  PCP:  [name], MD    No chief complaint on file.     History of Present Illness: Jesse Woods is a 64 y.o. male with a history of type II DM, dyslipidemia and HTN who presents today for followup  of SOB and LE edema.When I saw him last an echo was done showing severe LV dysfunction with an EF of 15% and dilated RV with RV dysfunction. 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  His Lasix was increased for elevated LVEDP.  Given his history of subarachnoid hemorrhage in the past and need for DAPT with a PCI of the RCA +/- L Cx, medical therapy was recommended and consideration of PCI if he had CP.  He presents today doing well.   He denies any chest pain, SOB, DOE, LE edema, dizziness, palpitations or syncope.    Past Medical History  Diagnosis Date  . Diabetes mellitus, type II   . Hyperlipidemia   . Hypertension   . Subarachnoid hemorrhage 1998    from HTN  . DCM (dilated cardiomyopathy) 06/22/2014    EF 15%    Past Surgical History  Procedure Laterality Date  . Brain surgery  1998    right frontal ventriculotomy with subsequent ventriculo-abfdominal shunt due to [facility]  . Hernia repair  1954    right  . Left heart catheterization with coronary angiogram N/A 06/24/2014    Procedure: LEFT HEART CATHETERIZATION WITH CORONARY ANGIOGRAM;  Surgeon: Jettie Booze, MD;  Location: [facility] CATH LAB;  Service: Cardiovascular;  Laterality: N/A;     Current Outpatient Prescriptions  Medication Sig Dispense Refill  . aspirin 81 MG tablet Take 81 mg by mouth daily.      . carvedilol (COREG) 3.125 MG tablet Take 1 tablet (3.125 mg total) by mouth 2 (two) times daily. 60 tablet 3  . ezetimibe-simvastatin (VYTORIN) 10-80 MG per tablet take 1 tablet  by mouth once daily 30 tablet 5  . furosemide (LASIX) 40 MG tablet Take one tablet by mouth twice daily x 3 days, then decrease to one tablet daily 33 tablet 3  . lisinopril (PRINIVIL,ZESTRIL) 20 MG tablet Take 1 tablet (20 mg total) by mouth 2 (two) times daily. 60 tablet 11  . metFORMIN (GLUCOPHAGE) 1000 MG tablet Take 1 tablet (1,000 mg total) by mouth 2 (two) times daily with a meal. 180 tablet 3  . Multiple Vitamins-Minerals (MULTIVITAMIN WITH MINERALS) tablet Take 1 tablet by mouth daily.      Marland Kitchen spironolactone (ALDACTONE) 25 MG tablet Take 0.5 tablets (12.5 mg total) by mouth daily. 30 tablet 3  . tetrahydrozoline-zinc (VISINE-AC) 0.05-0.25 % ophthalmic solution Place 1 drop into both eyes daily as needed (allergy irritation.).     No current facility-administered medications for this visit.    Allergies:   Review of patient's allergies indicates no known allergies.    Social History:  The patient  reports that he has never smoked. He has never used smokeless tobacco. He reports that he does not drink alcohol or use illicit drugs.   Family History:  The patient's family history includes Cancer in his brother, cousin, father, and mother; Diabetes in  his father; Hypertension in his father; Pancreatic cancer in his mother; Prostate cancer in his father and paternal uncle.    ROS:  Please see the history of present illness.   Otherwise, review of systems are positive for none.   All other systems are reviewed and negative.    PHYSICAL EXAM: VS:  BP 132/82 mmHg  Pulse 82  Ht 5\' 8"  (1.727 m)  Wt 201 lb (91.173 kg)  BMI 30.57 kg/m2  SpO2 99% , BMI Body mass index is 30.57 kg/(m^2). GEN: Well nourished, well developed, in no acute distress HEENT: normal Neck: no JVD, carotid bruits, or masses Cardiac: RRR; no murmurs, rubs, or gallops,no edema  Respiratory:  clear to auscultation bilaterally, normal work of breathing GI: soft, nontender, nondistended, + BS MS: no deformity or  atrophy Skin: warm and dry, no rash Neuro:  Strength and sensation are intact Psych: euthymic mood, full affect   EKG:  EKG is not ordered today.    Recent Labs: 03/15/2014: Pro B Natriuretic peptide (BNP) 658.0* 06/22/2014: BUN 18; Creatinine, Ser 1.27; Hemoglobin 11.6*; Platelets 278.0; Potassium 4.4; Sodium 138    Lipid Panel    Component Value Date/Time   CHOL 186 04/28/2012 1427   TRIG 178.0* 04/28/2012 1427   HDL 30.40* 04/28/2012 1427   CHOLHDL 6 04/28/2012 1427   VLDL 35.6 04/28/2012 1427   LDLCALC 120* 04/28/2012 1427   LDLDIRECT 215.3 01/03/2011 1035      Wt Readings from Last 3 Encounters:  11/08/14 201 lb (91.173 kg)  06/24/14 199 lb (90.266 kg)  06/22/14 199 lb 12.8 oz (90.629 kg)        ASSESSMENT AND PLAN:  1.  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  He appears well compensated on exam.  Continue medical therapy with BB/Lasix/aldactone/ACE I.  Recheck 2D echo to assess if EF has improved on medical therapy.  Check CMET 2.  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  He has not had any angina.  Continue ASA/statin/BB. 3.  Type II DM - he does not have a PCP so I will get him established with [facility] physicians at [facility].  Check HbgA1C 4.  HTN - well controlled on BB and ACE I 5.  Chronic systolic CHF - appears euvolemic on exam.  Continue diuretic 6.  HTN - controlled on BB/ACE I/aldactone 7.  Dyslipidemia - continue statin.  Check FLP and ALT   Current medicines are reviewed at length with the patient today.  The patient does not have concerns regarding medicines.  The following changes have been made:  no change  Labs/ tests ordered today: See above Assessment and Plan No orders of the defined types were placed in this encounter.     Disposition:   FU with me in 6 months  Signed, [name], MD  11/08/2014 3:30 PM    [address], [place], [place]  [address] Phone: [phone]; Fax: [phone]

## 2014-11-08 ENCOUNTER — Encounter: Payer: Self-pay | Admitting: Cardiology

## 2014-11-08 ENCOUNTER — Ambulatory Visit (INDEPENDENT_AMBULATORY_CARE_PROVIDER_SITE_OTHER): Payer: Commercial Managed Care - HMO | Admitting: Cardiology

## 2014-11-08 VITALS — BP 132/82 | HR 82 | Ht 68.0 in | Wt 201.0 lb

## 2014-11-08 DIAGNOSIS — I10 Essential (primary) hypertension: Secondary | ICD-10-CM | POA: Diagnosis not present

## 2014-11-08 DIAGNOSIS — I5022 Chronic systolic (congestive) heart failure: Secondary | ICD-10-CM | POA: Diagnosis not present

## 2014-11-08 DIAGNOSIS — I420 Dilated cardiomyopathy: Secondary | ICD-10-CM | POA: Diagnosis not present

## 2014-11-08 DIAGNOSIS — E785 Hyperlipidemia, unspecified: Secondary | ICD-10-CM

## 2014-11-08 DIAGNOSIS — I2510 Atherosclerotic heart disease of native coronary artery without angina pectoris: Secondary | ICD-10-CM

## 2014-11-08 NOTE — Patient Instructions (Signed)
Medication Instructions:  Your physician recommends that you continue on your current medications as directed. Please refer to the Current Medication list given to you today.   Labwork: FASTING lab work within one week: BMET, CBC, TSH, LFTs, Lipids, A1C  Testing/Procedures: Your physician has requested that you have an echocardiogram. Echocardiography is a painless test that uses sound waves to create images of your heart. It provides your doctor with information about the size and shape of your heart and how well your heart's chambers and valves are working. This procedure takes approximately one hour. There are no restrictions for this procedure.  Follow-Up: Your physician wants you to follow-up in: 6 months with Dr. Radford Pax. You will receive a reminder letter in the mail two months in advance. If you don't receive a letter, please call our office to schedule the follow-up appointment.   Any Other Special Instructions Will Be Listed Below (If Applicable).

## 2014-11-11 ENCOUNTER — Other Ambulatory Visit: Payer: Self-pay | Admitting: *Deleted

## 2014-11-11 MED ORDER — CARVEDILOL 3.125 MG PO TABS: 3.1250 mg | ORAL_TABLET | Freq: Two times a day (BID) | ORAL | Status: DC

## 2014-11-15 ENCOUNTER — Other Ambulatory Visit: Payer: Commercial Managed Care - HMO

## 2014-11-16 ENCOUNTER — Other Ambulatory Visit (INDEPENDENT_AMBULATORY_CARE_PROVIDER_SITE_OTHER): Payer: Commercial Managed Care - HMO | Admitting: *Deleted

## 2014-11-16 ENCOUNTER — Other Ambulatory Visit: Payer: Self-pay

## 2014-11-16 ENCOUNTER — Ambulatory Visit (HOSPITAL_COMMUNITY): Payer: Commercial Managed Care - HMO | Attending: Cardiology

## 2014-11-16 DIAGNOSIS — E785 Hyperlipidemia, unspecified: Secondary | ICD-10-CM

## 2014-11-16 DIAGNOSIS — E119 Type 2 diabetes mellitus without complications: Secondary | ICD-10-CM | POA: Insufficient documentation

## 2014-11-16 DIAGNOSIS — I059 Rheumatic mitral valve disease, unspecified: Secondary | ICD-10-CM | POA: Insufficient documentation

## 2014-11-16 DIAGNOSIS — I517 Cardiomegaly: Secondary | ICD-10-CM | POA: Insufficient documentation

## 2014-11-16 DIAGNOSIS — I77819 Aortic ectasia, unspecified site: Secondary | ICD-10-CM | POA: Insufficient documentation

## 2014-11-16 DIAGNOSIS — I509 Heart failure, unspecified: Secondary | ICD-10-CM | POA: Diagnosis present

## 2014-11-16 DIAGNOSIS — I10 Essential (primary) hypertension: Secondary | ICD-10-CM | POA: Diagnosis not present

## 2014-11-16 DIAGNOSIS — I5022 Chronic systolic (congestive) heart failure: Secondary | ICD-10-CM

## 2014-11-16 DIAGNOSIS — I420 Dilated cardiomyopathy: Secondary | ICD-10-CM | POA: Diagnosis not present

## 2014-11-16 DIAGNOSIS — I2510 Atherosclerotic heart disease of native coronary artery without angina pectoris: Secondary | ICD-10-CM

## 2014-11-16 DIAGNOSIS — I351 Nonrheumatic aortic (valve) insufficiency: Secondary | ICD-10-CM | POA: Diagnosis not present

## 2014-11-16 LAB — HEMOGLOBIN A1C: Hgb A1c MFr Bld: 5.9 % (ref 4.6–6.5)

## 2014-11-16 LAB — CBC WITH DIFFERENTIAL/PLATELET
BASOS ABS: 0 10*3/uL (ref 0.0–0.1)
Basophils Relative: 0.1 % (ref 0.0–3.0)
EOS ABS: 0.2 10*3/uL (ref 0.0–0.7)
Eosinophils Relative: 2.5 % (ref 0.0–5.0)
HCT: 39.2 % (ref 39.0–52.0)
Hemoglobin: 13 g/dL (ref 13.0–17.0)
LYMPHS ABS: 0.9 10*3/uL (ref 0.7–4.0)
Lymphocytes Relative: 14.3 % (ref 12.0–46.0)
MCHC: 33.3 g/dL (ref 30.0–36.0)
MCV: 90 fl (ref 78.0–100.0)
Monocytes Absolute: 0.3 10*3/uL (ref 0.1–1.0)
Monocytes Relative: 5.3 % (ref 3.0–12.0)
NEUTROS PCT: 77.8 % — AB (ref 43.0–77.0)
Neutro Abs: 4.8 10*3/uL (ref 1.4–7.7)
PLATELETS: 168 10*3/uL (ref 150.0–400.0)
RBC: 4.35 Mil/uL (ref 4.22–5.81)
RDW: 13.5 % (ref 11.5–15.5)
WBC: 6.1 10*3/uL (ref 4.0–10.5)

## 2014-11-16 LAB — BASIC METABOLIC PANEL
BUN: 11 mg/dL (ref 6–23)
CO2: 27 mEq/L (ref 19–32)
Calcium: 9.3 mg/dL (ref 8.4–10.5)
Chloride: 102 mEq/L (ref 96–112)
Creatinine, Ser: 1.12 mg/dL (ref 0.40–1.50)
GFR: 84.37 mL/min (ref >60.00)
Glucose, Bld: 126 mg/dL — ABNORMAL HIGH (ref 70–99)
Potassium: 3.8 mEq/L (ref 3.5–5.1)
Sodium: 137 mEq/L (ref 135–145)

## 2014-11-16 LAB — LIPID PANEL
Cholesterol: 161 mg/dL (ref 0–200)
HDL: 32.6 mg/dL — ABNORMAL LOW (ref >39.00)
LDL Cholesterol: 100 mg/dL — ABNORMAL HIGH (ref 0–99)
NonHDL: 128.75
Total CHOL/HDL Ratio: 5
Triglycerides: 143 mg/dL (ref 0.0–149.0)
VLDL: 28.6 mg/dL (ref 0.0–40.0)

## 2014-11-16 LAB — HEPATIC FUNCTION PANEL
ALT: 26 U/L (ref 0–53)
AST: 26 U/L (ref 0–37)
Albumin: 4.1 g/dL (ref 3.5–5.2)
Alkaline Phosphatase: 35 U/L — ABNORMAL LOW (ref 39–117)
Bilirubin, Direct: 0.1 mg/dL (ref 0.0–0.3)
Total Bilirubin: 0.9 mg/dL (ref 0.2–1.2)
Total Protein: 7.4 g/dL (ref 6.0–8.3)

## 2014-11-16 LAB — TSH: TSH: 1.58 u[IU]/mL (ref 0.35–4.50)

## 2014-11-24 ENCOUNTER — Telehealth: Payer: Self-pay | Admitting: *Deleted

## 2014-11-24 DIAGNOSIS — I609 Nontraumatic subarachnoid hemorrhage, unspecified: Secondary | ICD-10-CM

## 2014-11-24 DIAGNOSIS — I2510 Atherosclerotic heart disease of native coronary artery without angina pectoris: Secondary | ICD-10-CM

## 2014-11-24 MED ORDER — EZETIMIBE 10 MG PO TABS: 10.0000 mg | ORAL_TABLET | Freq: Every day | ORAL | Status: DC

## 2014-11-24 MED ORDER — ROSUVASTATIN CALCIUM 40 MG PO TABS: 40.0000 mg | ORAL_TABLET | Freq: Every day | ORAL | Status: DC

## 2014-11-24 NOTE — Telephone Encounter (Signed)
Pt notified of echo and and lab results by phone with verbal understanding to all recommendations and instructions. Referral to Dr. Frederich Cha for clearance for dual antiplatelet therapy. Pt said ok and thank you for all of our help.

## 2014-11-30 DIAGNOSIS — H40013 Open angle with borderline findings, low risk, bilateral: Secondary | ICD-10-CM | POA: Diagnosis not present

## 2014-11-30 DIAGNOSIS — H25813 Combined forms of age-related cataract, bilateral: Secondary | ICD-10-CM | POA: Diagnosis not present

## 2014-11-30 DIAGNOSIS — E11319 Type 2 diabetes mellitus with unspecified diabetic retinopathy without macular edema: Secondary | ICD-10-CM | POA: Diagnosis not present

## 2014-11-30 DIAGNOSIS — E119 Type 2 diabetes mellitus without complications: Secondary | ICD-10-CM | POA: Diagnosis not present

## 2014-11-30 DIAGNOSIS — H35042 Retinal micro-aneurysms, unspecified, left eye: Secondary | ICD-10-CM | POA: Diagnosis not present

## 2014-11-30 DIAGNOSIS — E11329 Type 2 diabetes mellitus with mild nonproliferative diabetic retinopathy without macular edema: Secondary | ICD-10-CM | POA: Diagnosis not present

## 2014-11-30 LAB — HM DIABETES EYE EXAM

## 2014-12-06 ENCOUNTER — Other Ambulatory Visit: Payer: Self-pay

## 2014-12-06 DIAGNOSIS — R0602 Shortness of breath: Secondary | ICD-10-CM

## 2014-12-06 MED ORDER — FUROSEMIDE 40 MG PO TABS: 40.0000 mg | ORAL_TABLET | Freq: Every day | ORAL | Status: DC

## 2014-12-06 NOTE — Telephone Encounter (Signed)
furosemide (LASIX) 40 MG tablet Take one tablet by mouth twice daily x 3 days, then decrease to one tablet daily

## 2014-12-06 NOTE — Telephone Encounter (Signed)
[name], MD at 11/07/2014 6:24 PM  Current medicines are reviewed at length with the patient today. The patient does not have concerns regarding medicines.  The following changes have been made: no change

## 2014-12-14 ENCOUNTER — Telehealth: Payer: Self-pay | Admitting: Cardiology

## 2014-12-14 NOTE — Telephone Encounter (Signed)
Received a note from Dr. Arnoldo Morale in Neurosurgery in regards to clearance to use antiplatelet agents for PCI.  He stated that patient could starts these meds if needed.  He has a history of subarachnoid bleed years ago. Per Dr. Hassell Done cath note he felt patient would benefit from PCI if cleared by Neurosurgery.  Please set up appt with Dr. Irish Lack to discuss PCI

## 2014-12-14 NOTE — Telephone Encounter (Signed)
Scheduled OV with Dr. Irish Lack 12/19/14 at 1030 to discuss PCI.  Patient agrees with treatment plan.

## 2014-12-19 ENCOUNTER — Encounter: Payer: Self-pay | Admitting: *Deleted

## 2014-12-19 ENCOUNTER — Encounter: Payer: Self-pay | Admitting: Interventional Cardiology

## 2014-12-19 ENCOUNTER — Ambulatory Visit (INDEPENDENT_AMBULATORY_CARE_PROVIDER_SITE_OTHER): Payer: Commercial Managed Care - HMO | Admitting: Interventional Cardiology

## 2014-12-19 VITALS — BP 124/98 | HR 80 | Ht 68.0 in | Wt 204.8 lb

## 2014-12-19 DIAGNOSIS — I5022 Chronic systolic (congestive) heart failure: Secondary | ICD-10-CM

## 2014-12-19 DIAGNOSIS — I10 Essential (primary) hypertension: Secondary | ICD-10-CM | POA: Diagnosis not present

## 2014-12-19 DIAGNOSIS — E1159 Type 2 diabetes mellitus with other circulatory complications: Secondary | ICD-10-CM

## 2014-12-19 DIAGNOSIS — I2510 Atherosclerotic heart disease of native coronary artery without angina pectoris: Secondary | ICD-10-CM | POA: Diagnosis not present

## 2014-12-19 DIAGNOSIS — I420 Dilated cardiomyopathy: Secondary | ICD-10-CM | POA: Diagnosis not present

## 2014-12-19 LAB — BASIC METABOLIC PANEL
BUN: 17 mg/dL (ref 6–23)
CO2: 27 mEq/L (ref 19–32)
CREATININE: 1.3 mg/dL (ref 0.40–1.50)
Calcium: 10.2 mg/dL (ref 8.4–10.5)
Chloride: 104 mEq/L (ref 96–112)
GFR: 71.02 mL/min (ref >60.00)
GLUCOSE: 158 mg/dL — AB (ref 70–99)
Potassium: 4.3 mEq/L (ref 3.5–5.1)
SODIUM: 140 meq/L (ref 135–145)

## 2014-12-19 LAB — PROTIME-INR
INR: 1.1 ratio — AB (ref 0.8–1.0)
PROTHROMBIN TIME: 12.4 s (ref 9.6–13.1)

## 2014-12-19 LAB — CBC
HCT: 41.6 % (ref 39.0–52.0)
Hemoglobin: 13.7 g/dL (ref 13.0–17.0)
MCHC: 33 g/dL (ref 30.0–36.0)
MCV: 90.4 fl (ref 78.0–100.0)
Platelets: 197 10*3/uL (ref 150.0–400.0)
RBC: 4.6 Mil/uL (ref 4.22–5.81)
RDW: 13.6 % (ref 11.5–15.5)
WBC: 6.4 10*3/uL (ref 4.0–10.5)

## 2014-12-19 MED ORDER — CLOPIDOGREL BISULFATE 75 MG PO TABS: 75.0000 mg | ORAL_TABLET | Freq: Every day | ORAL | Status: DC

## 2014-12-19 MED ORDER — ISOSORBIDE MONONITRATE ER 30 MG PO TB24: 30.0000 mg | ORAL_TABLET | Freq: Every day | ORAL | Status: DC

## 2014-12-19 NOTE — Patient Instructions (Signed)
Medication Instructions:  Your physician has recommended you make the following change in your medication:  Start Clopidogrel.  Take 300 mg today (4 tablets) then 75 mg (1 tablet) by mouth daily. Start Isosorbide mononitrate 30 mg by mouth daily.    Labwork: Lab work to be done today--BMP, CBC, PT  Testing/Procedures: Your physician has requested that you have a cardiac catheterization. Cardiac catheterization is used to diagnose and/or treat various heart conditions. Doctors may recommend this procedure for a number of different reasons. The most common reason is to evaluate chest pain. Chest pain can be a symptom of coronary artery disease (CAD), and cardiac catheterization can show whether plaque is narrowing or blocking your heart's arteries. This procedure is also used to evaluate the valves, as well as measure the blood flow and oxygen levels in different parts of your heart. For further information please visit [email or web address]. Please follow instruction sheet, as given.  Scheduled for December 22, 2014    Follow-Up: To be arranged after procedure.

## 2014-12-19 NOTE — Progress Notes (Signed)
Patient ID: Jesse Woods, male   DOB: 04/25/1950, 64 y.o.   MRN: [id number]     Cardiology Office Note   Date:  12/19/2014   ID:  Jesse Woods, DOB [birth year], MRN [id number]  PCP:  No PCP Per Patient    No chief complaint on file. CAD   Wt Readings from Last 3 Encounters:  12/19/14 204 lb 12.8 oz (92.897 kg)  11/08/14 201 lb (91.173 kg)  06/24/14 199 lb (90.266 kg)       History of Present Illness: Jesse Woods is a 64 y.o. male  with an ischemic cardiomyopathy diagnosed in February 2016. He was found to have chronic total occlusion of the mid circumflex and a severe lesion in the RCA. The LAD was free of severe disease. He had some shortness of breath at that time. Echocardiogram showed decreased ejection fraction. Intervention was not performed because of a history of a subarachnoid hemorrhage. Since that time, he has followed up with neurosurgery. They have cleared him for dual antiplatelet therapy for at least a year.  Overall, the patient is feeling better on medical therapy. His shortness of breath is better. Blood pressure is controlled. He does not remember much of the diagnostic procedure. He is here to discuss further intervention.      Past Medical History  Diagnosis Date  . Diabetes mellitus, type II   . Hyperlipidemia   . Hypertension   . Subarachnoid hemorrhage 1998    from HTN  . DCM (dilated cardiomyopathy) 06/22/2014    EF 15%    Past Surgical History  Procedure Laterality Date  . Brain surgery  1998    right frontal ventriculotomy with subsequent ventriculo-abfdominal shunt due to [facility]  . Hernia repair  1954    right  . Left heart catheterization with coronary angiogram N/A 06/24/2014    Procedure: LEFT HEART CATHETERIZATION WITH CORONARY ANGIOGRAM;  Surgeon: Jettie Booze, MD;  Location: [facility] CATH LAB;  Service: Cardiovascular;  Laterality: N/A;     Current Outpatient Prescriptions  Medication Sig Dispense Refill  . aspirin 81 MG  tablet Take 81 mg by mouth daily.      . carvedilol (COREG) 3.125 MG tablet Take 1 tablet (3.125 mg total) by mouth 2 (two) times daily. 60 tablet 5  . ezetimibe (ZETIA) 10 MG tablet Take 1 tablet (10 mg total) by mouth daily. 30 tablet 11  . furosemide (LASIX) 40 MG tablet Take 1 tablet (40 mg total) by mouth daily. one tablet daily 90 tablet 3  . lisinopril (PRINIVIL,ZESTRIL) 20 MG tablet Take 1 tablet (20 mg total) by mouth 2 (two) times daily. 60 tablet 11  . metFORMIN (GLUCOPHAGE) 1000 MG tablet Take 1 tablet (1,000 mg total) by mouth 2 (two) times daily with a meal. 180 tablet 3  . Multiple Vitamins-Minerals (MULTIVITAMIN WITH MINERALS) tablet Take 1 tablet by mouth daily.      . rosuvastatin (CRESTOR) 40 MG tablet Take 1 tablet (40 mg total) by mouth daily. 30 tablet 11  . spironolactone (ALDACTONE) 25 MG tablet Take 0.5 tablets (12.5 mg total) by mouth daily. 30 tablet 3  . tetrahydrozoline-zinc (VISINE-AC) 0.05-0.25 % ophthalmic solution Place 1 drop into both eyes daily as needed (allergy irritation.).    Marland Kitchen clopidogrel (PLAVIX) 75 MG tablet Take 1 tablet (75 mg total) by mouth daily. 30 tablet 11  . isosorbide mononitrate (IMDUR) 30 MG 24 hr tablet Take 1 tablet (30 mg total) by mouth daily. 30 tablet 11  No current facility-administered medications for this visit.    Allergies:   Review of patient's allergies indicates no known allergies.    Social History:  The patient  reports that he has never smoked. He has never used smokeless tobacco. He reports that he does not drink alcohol or use illicit drugs.   Family History:  The patient's family history includes Cancer in his brother, cousin, father, and mother; Diabetes in his father; Hypertension in his father; Pancreatic cancer in his mother; Prostate cancer in his father and paternal uncle.    ROS:  Please see the history of present illness.   Otherwise, review of systems are positive for prior head trauma.   All other systems  are reviewed and negative.    PHYSICAL EXAM: VS:  BP 124/98 mmHg  Pulse 80  Ht 5\' 8"  (1.727 m)  Wt 204 lb 12.8 oz (92.897 kg)  BMI 31.15 kg/m2  SpO2 97% , BMI Body mass index is 31.15 kg/(m^2). GEN: Well nourished, well developed, in no acute distress HEENT: normal Neck: no JVD, carotid bruits, or masses Cardiac: RRR; no murmurs, rubs, or gallops,no edema  Respiratory:  clear to auscultation bilaterally, normal work of breathing GI: soft, nontender, nondistended, + BS MS: no deformity or atrophy Skin: warm and dry, no rash Neuro:  Strength and sensation are intact Psych: euthymic mood, full affect      Recent Labs: 03/15/2014: Pro B Natriuretic peptide (BNP) 658.0* 11/16/2014: ALT 26; BUN 11; Creatinine, Ser 1.12; Hemoglobin 13.0; Platelets 168.0; Potassium 3.8; Sodium 137; TSH 1.58   Lipid Panel    Component Value Date/Time   CHOL 161 11/16/2014 0754   TRIG 143.0 11/16/2014 0754   HDL 32.60* 11/16/2014 0754   CHOLHDL 5 11/16/2014 0754   VLDL 28.6 11/16/2014 0754   LDLCALC 100* 11/16/2014 0754   LDLDIRECT 215.3 01/03/2011 1035     Other studies Reviewed: Additional studies/ records that were reviewed today with results demonstrating: I personally reviewed the cath films from earlier in 2016. An AL 2 guide catheter was used to engage the RCA..   ASSESSMENT AND PLAN:  1. CAD: In an attempt to improve his left ventricular ejection fraction, I think PCI of the RCA is reasonable. If that goes well, could also see if a wire would pass across the occlusion in the circumflex. There is a large obtuse marginal branch which feeds from left to left collaterals.  start clopidogrel 75 mg daily after a 300 mg bolus dose today. The risks and benefits of PCI were explained to the patient and he is agreeable to proceed. All questions were answered.  2. Ischemic cardio myopathy/chronic systolic heart failure : We'll add isosorbide. If blood pressure will tolerate, he may also benefit from  hydralazine given that he is African-American with a decreased ejection fraction. 3. Hypertension: Fairly well controlled. Diastolic elevated today. 4. Diabetes: Managed by his primary care physician.   Current medicines are reviewed at length with the patient today.  The patient concerns regarding his medicines were addressed.  The following changes have been made:  Start Imdur  Labs/ tests ordered today include:  Orders Placed This Encounter  Procedures  . Basic Metabolic Panel (BMET)  . CBC  . INR/PT    Recommend 150 minutes/week of aerobic exercise Low fat, low carb, high fiber diet recommended  Disposition:   FU in post cath   Teresita Madura., MD  12/19/2014 11:35 AM    [facility] [address] [place], [place]  [address] Phone: [phone]; Fax: [phone]

## 2014-12-22 ENCOUNTER — Encounter (HOSPITAL_COMMUNITY): Payer: Self-pay | Admitting: Interventional Cardiology

## 2014-12-22 ENCOUNTER — Encounter (HOSPITAL_COMMUNITY)
Admission: RE | Disposition: A | Discharge status: Home / Self Care | Payer: Self-pay | Source: Ambulatory Visit | Attending: Interventional Cardiology

## 2014-12-22 ENCOUNTER — Ambulatory Visit (HOSPITAL_COMMUNITY)
Admission: RE | Admit: 2014-12-22 | Discharge: 2014-12-23 | Disposition: A | Discharge status: Home / Self Care | Payer: Commercial Managed Care - HMO | Source: Ambulatory Visit | Attending: Interventional Cardiology | Admitting: Interventional Cardiology

## 2014-12-22 DIAGNOSIS — I5022 Chronic systolic (congestive) heart failure: Secondary | ICD-10-CM | POA: Diagnosis not present

## 2014-12-22 DIAGNOSIS — Z955 Presence of coronary angioplasty implant and graft: Secondary | ICD-10-CM | POA: Insufficient documentation

## 2014-12-22 DIAGNOSIS — Z7902 Long term (current) use of antithrombotics/antiplatelets: Secondary | ICD-10-CM | POA: Diagnosis not present

## 2014-12-22 DIAGNOSIS — Z7982 Long term (current) use of aspirin: Secondary | ICD-10-CM | POA: Insufficient documentation

## 2014-12-22 DIAGNOSIS — I10 Essential (primary) hypertension: Secondary | ICD-10-CM | POA: Diagnosis present

## 2014-12-22 DIAGNOSIS — Z23 Encounter for immunization: Secondary | ICD-10-CM | POA: Insufficient documentation

## 2014-12-22 DIAGNOSIS — E785 Hyperlipidemia, unspecified: Secondary | ICD-10-CM | POA: Diagnosis present

## 2014-12-22 DIAGNOSIS — D696 Thrombocytopenia, unspecified: Secondary | ICD-10-CM | POA: Diagnosis not present

## 2014-12-22 DIAGNOSIS — E119 Type 2 diabetes mellitus without complications: Secondary | ICD-10-CM | POA: Diagnosis not present

## 2014-12-22 DIAGNOSIS — I2582 Chronic total occlusion of coronary artery: Secondary | ICD-10-CM | POA: Diagnosis not present

## 2014-12-22 DIAGNOSIS — I255 Ischemic cardiomyopathy: Secondary | ICD-10-CM | POA: Diagnosis not present

## 2014-12-22 DIAGNOSIS — I2510 Atherosclerotic heart disease of native coronary artery without angina pectoris: Secondary | ICD-10-CM | POA: Diagnosis not present

## 2014-12-22 HISTORY — DX: Atherosclerotic heart disease of native coronary artery without angina pectoris: I25.10

## 2014-12-22 HISTORY — PX: CARDIAC CATHETERIZATION: SHX172

## 2014-12-22 HISTORY — DX: Unspecified osteoarthritis, unspecified site: M19.90

## 2014-12-22 LAB — POCT ACTIVATED CLOTTING TIME: ACTIVATED CLOTTING TIME: 436 s

## 2014-12-22 LAB — GLUCOSE, CAPILLARY
GLUCOSE-CAPILLARY: 130 mg/dL — AB (ref 65–99)
GLUCOSE-CAPILLARY: 146 mg/dL — AB (ref 65–99)
Glucose-Capillary: 152 mg/dL — ABNORMAL HIGH (ref 65–99)
Glucose-Capillary: 136 mg/dL — ABNORMAL HIGH (ref 65–99)

## 2014-12-22 SURGERY — CORONARY STENT INTERVENTION

## 2014-12-22 MED ORDER — CLOPIDOGREL BISULFATE 75 MG PO TABS: 75.0000 mg | ORAL_TABLET | Freq: Every day | ORAL | Status: DC

## 2014-12-22 MED ORDER — SODIUM CHLORIDE 0.9 % IJ SOLN: 3.0000 mL | INTRAMUSCULAR | Status: DC | PRN

## 2014-12-22 MED ORDER — ACETAMINOPHEN 325 MG PO TABS: 650.0000 mg | ORAL_TABLET | ORAL | Status: DC | PRN

## 2014-12-22 MED ORDER — BIVALIRUDIN 250 MG IV SOLR
INTRAVENOUS | Status: AC
  Filled 2014-12-22: qty 250

## 2014-12-22 MED ORDER — LISINOPRIL 20 MG PO TABS
20.0000 mg | ORAL_TABLET | Freq: Two times a day (BID) | ORAL | Status: DC
  Administered 2014-12-22 – 2014-12-23 (×2): 20 mg via ORAL
  Filled 2014-12-22 (×2): qty 2
  Filled 2014-12-22: qty 1
  Filled 2014-12-22: qty 2
  Filled 2014-12-22 (×2): qty 1

## 2014-12-22 MED ORDER — SODIUM CHLORIDE 0.9 % WEIGHT BASED INFUSION
3.0000 mL/kg/h | INTRAVENOUS | Status: DC
  Administered 2014-12-22: 3 mL/kg/h via INTRAVENOUS

## 2014-12-22 MED ORDER — MIDAZOLAM HCL 2 MG/2ML IJ SOLN
INTRAMUSCULAR | Status: DC | PRN
  Administered 2014-12-22: 2 mg via INTRAVENOUS

## 2014-12-22 MED ORDER — METFORMIN HCL 500 MG PO TABS
1000.0000 mg | ORAL_TABLET | Freq: Two times a day (BID) | ORAL | Status: DC
Start: 2014-12-24 — End: 2014-12-23

## 2014-12-22 MED ORDER — SODIUM CHLORIDE 0.9 % IV SOLN: 250.0000 mL | INTRAVENOUS | Status: DC | PRN

## 2014-12-22 MED ORDER — ROSUVASTATIN CALCIUM 40 MG PO TABS
40.0000 mg | ORAL_TABLET | Freq: Every day | ORAL | Status: DC
Start: 2014-12-23 — End: 2014-12-23
  Administered 2014-12-23: 11:00:00 40 mg via ORAL
  Filled 2014-12-22: qty 2
  Filled 2014-12-22: qty 1

## 2014-12-22 MED ORDER — HEPARIN (PORCINE) IN NACL 2-0.9 UNIT/ML-% IJ SOLN
INTRAMUSCULAR | Status: AC
  Filled 2014-12-22: qty 1500

## 2014-12-22 MED ORDER — LIDOCAINE HCL (PF) 1 % IJ SOLN
INTRAMUSCULAR | Status: AC
  Filled 2014-12-22: qty 30

## 2014-12-22 MED ORDER — CARVEDILOL 3.125 MG PO TABS
3.1250 mg | ORAL_TABLET | Freq: Two times a day (BID) | ORAL | Status: DC
  Administered 2014-12-22 – 2014-12-23 (×2): 3.125 mg via ORAL
  Filled 2014-12-22 (×2): qty 1

## 2014-12-22 MED ORDER — ASPIRIN 81 MG PO CHEW: 81.0000 mg | CHEWABLE_TABLET | ORAL | Status: DC

## 2014-12-22 MED ORDER — EZETIMIBE 10 MG PO TABS
10.0000 mg | ORAL_TABLET | Freq: Every day | ORAL | Status: DC
  Administered 2014-12-23: 11:00:00 10 mg via ORAL
  Filled 2014-12-22: qty 1

## 2014-12-22 MED ORDER — NITROGLYCERIN 1 MG/10 ML FOR IR/CATH LAB
INTRA_ARTERIAL | Status: DC | PRN
  Administered 2014-12-22: 10:00:00

## 2014-12-22 MED ORDER — CLOPIDOGREL BISULFATE 300 MG PO TABS
ORAL_TABLET | ORAL | Status: AC
  Filled 2014-12-22: qty 1

## 2014-12-22 MED ORDER — BIVALIRUDIN BOLUS VIA INFUSION - CUPID
INTRAVENOUS | Status: DC | PRN
  Administered 2014-12-22: 68.025 mg via INTRAVENOUS

## 2014-12-22 MED ORDER — CLOPIDOGREL BISULFATE 300 MG PO TABS
ORAL_TABLET | ORAL | Status: DC | PRN
  Administered 2014-12-22: 300 mg via ORAL

## 2014-12-22 MED ORDER — SODIUM CHLORIDE 0.9 % IV SOLN
250.0000 mg | INTRAVENOUS | Status: DC | PRN
  Administered 2014-12-22: 1.75 mg/kg/h via INTRAVENOUS

## 2014-12-22 MED ORDER — ADULT MULTIVITAMIN W/MINERALS CH
1.0000 | ORAL_TABLET | Freq: Every day | ORAL | Status: DC
  Administered 2014-12-23: 1 via ORAL
  Filled 2014-12-22 (×2): qty 1

## 2014-12-22 MED ORDER — SODIUM CHLORIDE 0.9 % WEIGHT BASED INFUSION: 3.0000 mL/kg/h | INTRAVENOUS | Status: AC

## 2014-12-22 MED ORDER — ASPIRIN 81 MG PO CHEW
81.0000 mg | CHEWABLE_TABLET | Freq: Every day | ORAL | Status: DC
  Administered 2014-12-23: 11:00:00 81 mg via ORAL
  Filled 2014-12-22: qty 1

## 2014-12-22 MED ORDER — IOHEXOL 350 MG/ML SOLN
INTRAVENOUS | Status: DC | PRN
  Administered 2014-12-22: 75 mL via INTRA_ARTERIAL

## 2014-12-22 MED ORDER — PNEUMOCOCCAL VAC POLYVALENT 25 MCG/0.5ML IJ INJ
0.5000 mL | INJECTION | INTRAMUSCULAR | Status: AC
  Administered 2014-12-23: 11:00:00 0.5 mL via INTRAMUSCULAR
  Filled 2014-12-22: qty 0.5

## 2014-12-22 MED ORDER — NITROGLYCERIN 1 MG/10 ML FOR IR/CATH LAB
INTRA_ARTERIAL | Status: AC
Start: 2014-12-22 — End: 2014-12-22
  Filled 2014-12-22: qty 10

## 2014-12-22 MED ORDER — CLOPIDOGREL BISULFATE 75 MG PO TABS: 75.0000 mg | ORAL_TABLET | Freq: Once | ORAL | Status: DC

## 2014-12-22 MED ORDER — MORPHINE SULFATE (PF) 2 MG/ML IV SOLN: 1.0000 mg | INTRAVENOUS | Status: DC | PRN

## 2014-12-22 MED ORDER — SODIUM CHLORIDE 0.9 % IJ SOLN: 3.0000 mL | Freq: Two times a day (BID) | INTRAMUSCULAR | Status: DC

## 2014-12-22 MED ORDER — ASPIRIN 81 MG PO TABS: 81.0000 mg | ORAL_TABLET | Freq: Every day | ORAL | Status: DC

## 2014-12-22 MED ORDER — ISOSORBIDE MONONITRATE ER 30 MG PO TB24
30.0000 mg | ORAL_TABLET | Freq: Every day | ORAL | Status: DC
  Administered 2014-12-23: 30 mg via ORAL
  Filled 2014-12-22: qty 1

## 2014-12-22 MED ORDER — FENTANYL CITRATE (PF) 100 MCG/2ML IJ SOLN
INTRAMUSCULAR | Status: AC
  Filled 2014-12-22: qty 4

## 2014-12-22 MED ORDER — CLOPIDOGREL BISULFATE 75 MG PO TABS
75.0000 mg | ORAL_TABLET | Freq: Every day | ORAL | Status: DC
  Administered 2014-12-23: 75 mg via ORAL
  Filled 2014-12-22: qty 1

## 2014-12-22 MED ORDER — ONDANSETRON HCL 4 MG/2ML IJ SOLN: 4.0000 mg | Freq: Four times a day (QID) | INTRAMUSCULAR | Status: DC | PRN

## 2014-12-22 MED ORDER — SODIUM CHLORIDE 0.9 % WEIGHT BASED INFUSION: 1.0000 mL/kg/h | INTRAVENOUS | Status: DC

## 2014-12-22 MED ORDER — FENTANYL CITRATE (PF) 100 MCG/2ML IJ SOLN
INTRAMUSCULAR | Status: DC | PRN
  Administered 2014-12-22: 25 ug via INTRAVENOUS

## 2014-12-22 MED ORDER — MIDAZOLAM HCL 2 MG/2ML IJ SOLN
INTRAMUSCULAR | Status: AC
  Filled 2014-12-22: qty 4

## 2014-12-22 MED ORDER — SODIUM CHLORIDE 0.9 % IJ SOLN
3.0000 mL | Freq: Two times a day (BID) | INTRAMUSCULAR | Status: DC
Start: 2014-12-22 — End: 2014-12-23
  Administered 2014-12-22: 15:00:00 3 mL via INTRAVENOUS

## 2014-12-22 MED ORDER — ATROPINE SULFATE 0.1 MG/ML IJ SOLN
INTRAMUSCULAR | Status: AC
  Filled 2014-12-22: qty 10

## 2014-12-22 SURGICAL SUPPLY — 17 items
BALLN EUPHORA RX 2.0X12 (BALLOONS) ×4
BALLN ~~LOC~~ EMERGE MR 3.5X20 (BALLOONS) ×8
BALLOON EUPHORA RX 2.0X12 (BALLOONS) ×2 IMPLANT
BALLOON ~~LOC~~ EMERGE MR 3.5X20 (BALLOONS) ×4 IMPLANT
CATH INFINITI 5FR ANG PIGTAIL (CATHETERS) ×4 IMPLANT
CATH INFINITI 5FR JL4 (CATHETERS) ×4 IMPLANT
GUIDE CATH RUNWAY 6FR AL2 (CATHETERS) ×4 IMPLANT
KIT ENCORE 26 ADVANTAGE (KITS) ×4 IMPLANT
KIT HEART LEFT (KITS) ×4 IMPLANT
PACK CARDIAC CATHETERIZATION (CUSTOM PROCEDURE TRAY) ×4 IMPLANT
SHEATH PINNACLE 6F 10CM (SHEATH) ×4 IMPLANT
STENT SYNERGY DES 3X32 (Permanent Stent) ×4 IMPLANT
TRANSDUCER W/STOPCOCK (MISCELLANEOUS) ×4 IMPLANT
TUBING CIL FLEX 10 FLL-RA (TUBING) ×4 IMPLANT
VALVE GUARDIAN II ~~LOC~~ HEMO (MISCELLANEOUS) ×4 IMPLANT
WIRE ASAHI PROWATER 180CM (WIRE) ×4 IMPLANT
WIRE EMERALD 3MM-J .035X150CM (WIRE) ×4 IMPLANT

## 2014-12-22 NOTE — H&P (View-Only) (Signed)
Patient ID: Jesse Woods, male   DOB: [birth year], 64 y.o.   MRN: [id number]     Cardiology Office Note   Date:  12/19/2014   ID:  Jesse Woods, DOB 1950/11/01, MRN [id number]  PCP:  No PCP Per Patient    No chief complaint on file. CAD   Wt Readings from Last 3 Encounters:  12/19/14 204 lb 12.8 oz (92.897 kg)  11/08/14 201 lb (91.173 kg)  06/24/14 199 lb (90.266 kg)       History of Present Illness: Jesse Woods is a 64 y.o. male  with an ischemic cardiomyopathy diagnosed in February 2016. He was found to have chronic total occlusion of the mid circumflex and a severe lesion in the RCA. The LAD was free of severe disease. He had some shortness of breath at that time. Echocardiogram showed decreased ejection fraction. Intervention was not performed because of a history of a subarachnoid hemorrhage. Since that time, he has followed up with neurosurgery. They have cleared him for dual antiplatelet therapy for at least a year.  Overall, the patient is feeling better on medical therapy. His shortness of breath is better. Blood pressure is controlled. He does not remember much of the diagnostic procedure. He is here to discuss further intervention.      Past Medical History  Diagnosis Date  . Diabetes mellitus, type II   . Hyperlipidemia   . Hypertension   . Subarachnoid hemorrhage 1998    from HTN  . DCM (dilated cardiomyopathy) 06/22/2014    EF 15%    Past Surgical History  Procedure Laterality Date  . Brain surgery  1998    right frontal ventriculotomy with subsequent ventriculo-abfdominal shunt due to [facility]  . Hernia repair  1954    right  . Left heart catheterization with coronary angiogram N/A 06/24/2014    Procedure: LEFT HEART CATHETERIZATION WITH CORONARY ANGIOGRAM;  Surgeon: Jettie Booze, MD;  Location: [facility] CATH LAB;  Service: Cardiovascular;  Laterality: N/A;     Current Outpatient Prescriptions  Medication Sig Dispense Refill  . aspirin 81 MG  tablet Take 81 mg by mouth daily.      . carvedilol (COREG) 3.125 MG tablet Take 1 tablet (3.125 mg total) by mouth 2 (two) times daily. 60 tablet 5  . ezetimibe (ZETIA) 10 MG tablet Take 1 tablet (10 mg total) by mouth daily. 30 tablet 11  . furosemide (LASIX) 40 MG tablet Take 1 tablet (40 mg total) by mouth daily. one tablet daily 90 tablet 3  . lisinopril (PRINIVIL,ZESTRIL) 20 MG tablet Take 1 tablet (20 mg total) by mouth 2 (two) times daily. 60 tablet 11  . metFORMIN (GLUCOPHAGE) 1000 MG tablet Take 1 tablet (1,000 mg total) by mouth 2 (two) times daily with a meal. 180 tablet 3  . Multiple Vitamins-Minerals (MULTIVITAMIN WITH MINERALS) tablet Take 1 tablet by mouth daily.      . rosuvastatin (CRESTOR) 40 MG tablet Take 1 tablet (40 mg total) by mouth daily. 30 tablet 11  . spironolactone (ALDACTONE) 25 MG tablet Take 0.5 tablets (12.5 mg total) by mouth daily. 30 tablet 3  . tetrahydrozoline-zinc (VISINE-AC) 0.05-0.25 % ophthalmic solution Place 1 drop into both eyes daily as needed (allergy irritation.).    Marland Kitchen clopidogrel (PLAVIX) 75 MG tablet Take 1 tablet (75 mg total) by mouth daily. 30 tablet 11  . isosorbide mononitrate (IMDUR) 30 MG 24 hr tablet Take 1 tablet (30 mg total) by mouth daily. 30 tablet 11  No current facility-administered medications for this visit.    Allergies:   Review of patient's allergies indicates no known allergies.    Social History:  The patient  reports that he has never smoked. He has never used smokeless tobacco. He reports that he does not drink alcohol or use illicit drugs.   Family History:  The patient's family history includes Cancer in his brother, cousin, father, and mother; Diabetes in his father; Hypertension in his father; Pancreatic cancer in his mother; Prostate cancer in his father and paternal uncle.    ROS:  Please see the history of present illness.   Otherwise, review of systems are positive for prior head trauma.   All other systems  are reviewed and negative.    PHYSICAL EXAM: VS:  BP 124/98 mmHg  Pulse 80  Ht 5\' 8"  (1.727 m)  Wt 204 lb 12.8 oz (92.897 kg)  BMI 31.15 kg/m2  SpO2 97% , BMI Body mass index is 31.15 kg/(m^2). GEN: Well nourished, well developed, in no acute distress HEENT: normal Neck: no JVD, carotid bruits, or masses Cardiac: RRR; no murmurs, rubs, or gallops,no edema  Respiratory:  clear to auscultation bilaterally, normal work of breathing GI: soft, nontender, nondistended, + BS MS: no deformity or atrophy Skin: warm and dry, no rash Neuro:  Strength and sensation are intact Psych: euthymic mood, full affect      Recent Labs: 03/15/2014: Pro B Natriuretic peptide (BNP) 658.0* 11/16/2014: ALT 26; BUN 11; Creatinine, Ser 1.12; Hemoglobin 13.0; Platelets 168.0; Potassium 3.8; Sodium 137; TSH 1.58   Lipid Panel    Component Value Date/Time   CHOL 161 11/16/2014 0754   TRIG 143.0 11/16/2014 0754   HDL 32.60* 11/16/2014 0754   CHOLHDL 5 11/16/2014 0754   VLDL 28.6 11/16/2014 0754   LDLCALC 100* 11/16/2014 0754   LDLDIRECT 215.3 01/03/2011 1035     Other studies Reviewed: Additional studies/ records that were reviewed today with results demonstrating: I personally reviewed the cath films from earlier in 2016. An AL 2 guide catheter was used to engage the RCA..   ASSESSMENT AND PLAN:  1. CAD: In an attempt to improve his left ventricular ejection fraction, I think PCI of the RCA is reasonable. If that goes well, could also see if a wire would pass across the occlusion in the circumflex. There is a large obtuse marginal branch which feeds from left to left collaterals.  start clopidogrel 75 mg daily after a 300 mg bolus dose today. The risks and benefits of PCI were explained to the patient and he is agreeable to proceed. All questions were answered.  2. Ischemic cardio myopathy/chronic systolic heart failure : We'll add isosorbide. If blood pressure will tolerate, he may also benefit from  hydralazine given that he is African-American with a decreased ejection fraction. 3. Hypertension: Fairly well controlled. Diastolic elevated today. 4. Diabetes: Managed by his primary care physician.   Current medicines are reviewed at length with the patient today.  The patient concerns regarding his medicines were addressed.  The following changes have been made:  Start Imdur  Labs/ tests ordered today include:  Orders Placed This Encounter  Procedures  . Basic Metabolic Panel (BMET)  . CBC  . INR/PT    Recommend 150 minutes/week of aerobic exercise Low fat, low carb, high fiber diet recommended  Disposition:   FU in post cath   Teresita Madura., MD  12/19/2014 11:35 AM    [place] [address] [place], [place]  [address] Phone: [phone]; Fax: [phone]

## 2014-12-22 NOTE — Interval H&P Note (Signed)
Cath Lab Visit (complete for each Cath Lab visit)  Clinical Evaluation Leading to the Procedure:   ACS: No.  Non-ACS:    Anginal Classification: CCS II  Anti-ischemic medical therapy: Maximal Therapy (2 or more classes of medications)  Non-Invasive Test Results: Intermediate-risk stress test findings: cardiac mortality 1-3%/year  Prior CABG: No previous CABG      History and Physical Interval Note:  12/22/2014 9:07 AM  Jesse Woods  has presented today for surgery, with the diagnosis of cad  The various methods of treatment have been discussed with the patient and family. After consideration of risks, benefits and other options for treatment, the patient has consented to  Procedure(s): Coronary Stent Intervention (N/A) as a surgical intervention .  The patient's history has been reviewed, patient examined, no change in status, stable for surgery.  I have reviewed the patient's chart and labs.  Questions were answered to the patient's satisfaction.     VARANASI,JAYADEEP S.

## 2014-12-22 NOTE — Progress Notes (Signed)
Site area: right groin  Site Prior to Removal:  Level 0  Pressure Applied For 20 MINUTES    Minutes Beginning at 1300  Manual:   Yes.    Patient Status During Pull:  AAO X 4  Post Pull Groin Site:  Level 0  Post Pull Instructions Given:  Yes.    Post Pull Pulses Present:  Yes.    Dressing Applied:  Yes.    Comments:  Tolerated procedure well

## 2014-12-23 DIAGNOSIS — I10 Essential (primary) hypertension: Secondary | ICD-10-CM

## 2014-12-23 DIAGNOSIS — I5022 Chronic systolic (congestive) heart failure: Secondary | ICD-10-CM

## 2014-12-23 DIAGNOSIS — E119 Type 2 diabetes mellitus without complications: Secondary | ICD-10-CM | POA: Diagnosis not present

## 2014-12-23 DIAGNOSIS — Z955 Presence of coronary angioplasty implant and graft: Secondary | ICD-10-CM | POA: Diagnosis not present

## 2014-12-23 DIAGNOSIS — I2510 Atherosclerotic heart disease of native coronary artery without angina pectoris: Secondary | ICD-10-CM | POA: Diagnosis not present

## 2014-12-23 DIAGNOSIS — Z7902 Long term (current) use of antithrombotics/antiplatelets: Secondary | ICD-10-CM | POA: Diagnosis not present

## 2014-12-23 DIAGNOSIS — I255 Ischemic cardiomyopathy: Secondary | ICD-10-CM | POA: Diagnosis not present

## 2014-12-23 DIAGNOSIS — I2582 Chronic total occlusion of coronary artery: Secondary | ICD-10-CM | POA: Diagnosis not present

## 2014-12-23 DIAGNOSIS — E785 Hyperlipidemia, unspecified: Secondary | ICD-10-CM | POA: Diagnosis not present

## 2014-12-23 LAB — BASIC METABOLIC PANEL
ANION GAP: 8 (ref 5–15)
BUN: 15 mg/dL (ref 6–20)
CALCIUM: 8.9 mg/dL (ref 8.9–10.3)
CO2: 25 mmol/L (ref 22–32)
Chloride: 105 mmol/L (ref 101–111)
Creatinine, Ser: 1.22 mg/dL (ref 0.61–1.24)
GLUCOSE: 127 mg/dL — AB (ref 65–99)
POTASSIUM: 4 mmol/L (ref 3.5–5.1)
SODIUM: 138 mmol/L (ref 135–145)

## 2014-12-23 LAB — CBC
HEMATOCRIT: 32.7 % — AB (ref 39.0–52.0)
HEMOGLOBIN: 11.1 g/dL — AB (ref 13.0–17.0)
MCH: 29.8 pg (ref 26.0–34.0)
MCHC: 33.9 g/dL (ref 30.0–36.0)
MCV: 87.7 fL (ref 78.0–100.0)
Platelets: 136 10*3/uL — ABNORMAL LOW (ref 150–400)
RBC: 3.73 MIL/uL — AB (ref 4.22–5.81)
RDW: 12.7 % (ref 11.5–15.5)
WBC: 5 10*3/uL (ref 4.0–10.5)

## 2014-12-23 LAB — GLUCOSE, CAPILLARY
GLUCOSE-CAPILLARY: 134 mg/dL — AB (ref 65–99)
GLUCOSE-CAPILLARY: 150 mg/dL — AB (ref 65–99)

## 2014-12-23 MED ORDER — NITROGLYCERIN 0.4 MG SL SUBL: 0.4000 mg | SUBLINGUAL_TABLET | SUBLINGUAL | Status: DC | PRN

## 2014-12-23 MED ORDER — NITROGLYCERIN 0.4 MG SL SUBL: 0.4000 mg | SUBLINGUAL_TABLET | Freq: Once | SUBLINGUAL | Status: AC

## 2014-12-23 MED ORDER — NITROGLYCERIN 0.4 MG SL SUBL
SUBLINGUAL_TABLET | SUBLINGUAL | Status: AC
  Administered 2014-12-23: 09:00:00 0.4 mg
  Filled 2014-12-23: qty 1

## 2014-12-23 NOTE — Progress Notes (Signed)
CARDIAC REHAB PHASE I   PRE:  Rate/Rhythm: 70 SR  BP:  Sitting: 169/89       SaO2: 98 RA  MODE:  Ambulation: 1000 ft  POST:  Rate/Rhythm: 95 SR  BP:  Sitting: 168/101       SaO2: 99 RA  Pt ambulated 1000 ft RA, handheld assist, steady gait, tolerated well.  Pt denies DOE, cp, dizziness, declined rest stop. Completed PCI/stent/CHF education.  Reviewed risk factors, anti-platelet therapy, stent card, activity restrictions, ntg, exercise, heart healthy diet, carb counting, portion control, CHF book and zone tool, daily weights, sodium and fluid restrictions and phase 2 cardiac rehab. Pt verbalized understanding. Pt agrees to phase 2 cardiac rehab referral, will send to [facility] [place] During discussion of nitroglycerin use and angina, pt states "I feel that now." Pt c/o 3-4/10 numbness/tightness to L neck, shoulder and arm. RN notified, EKG performed and sublingual nitro given with relief of symptoms per pt report. Pt requestedto continue education. Pt states he does not have a PCP and is not currently followed by a doctor for his diabetes. RN aware, pt will need CM consult to establish PCP. Pt in bed resting comfortably, call bell within reach.  [phone]  Lenna Sciara, RN, BSN 12/23/2014 9:44 AM

## 2014-12-23 NOTE — Discharge Instructions (Signed)
No driving for 24 hours. No lifting over 5 lbs for 1 week. No sexual activity for 1 week. Keep procedure site clean & dry. If you notice increased pain, swelling, bleeding or pus, call/return!  You may shower, but no soaking baths/hot tubs/pools for 1 week.   Please hold metformin for 48 hours after cath given interaction with contrast dye. You can restart metformin on 12/25/2014

## 2014-12-23 NOTE — Progress Notes (Signed)
Patient Name: Jesse Woods Date of Encounter: 12/23/2014  Primary Cardiologist: Dr. Radford Pax   Active Problems:   Diabetes mellitus   Dyslipidemia   Essential hypertension   Chronic systolic CHF (congestive heart failure)   CAD (coronary artery disease), native coronary artery    SUBJECTIVE  Denies any CP or SOB.   CURRENT MEDS . aspirin  81 mg Oral Daily  . carvedilol  3.125 mg Oral BID  . clopidogrel  75 mg Oral Q breakfast  . ezetimibe  10 mg Oral Daily  . isosorbide mononitrate  30 mg Oral Daily  . lisinopril  20 mg Oral BID  . [START ON 12/24/2014] metFORMIN  1,000 mg Oral BID WC  . multivitamin with minerals  1 tablet Oral Daily  . pneumococcal 23 valent vaccine  0.5 mL Intramuscular Tomorrow-1000  . rosuvastatin  40 mg Oral Daily  . sodium chloride  3 mL Intravenous Q12H    OBJECTIVE  Filed Vitals:   12/22/14 2009 12/22/14 2020 12/22/14 2021 12/23/14 0350  BP: 186/99 191/65 191/98 141/85  Pulse: 69 72  64  Temp: 97.6 F (36.4 C)   97.8 F (36.6 C)  TempSrc: Oral   Oral  Resp: 20   20  Height:      Weight:    208 lb 5.4 oz (94.5 kg)  SpO2: 96%   96%    Intake/Output Summary (Last 24 hours) at 12/23/14 0720 Last data filed at 12/23/14 0351  Gross per 24 hour  Intake 1688.4 ml  Output   3035 ml  Net -1346.6 ml   Filed Weights   12/22/14 0705 12/23/14 0350  Weight: 200 lb (90.719 kg) 208 lb 5.4 oz (94.5 kg)    PHYSICAL EXAM  General: Pleasant, NAD. Neuro: Alert and oriented X 3. Moves all extremities spontaneously. Psych: Normal affect. HEENT:  Normal  Neck: Supple without bruits or JVD. Lungs:  Resp regular and unlabored, CTA. Heart: RRR no s3, s4, or murmurs. R femoral cath site stable, no bleeding or hematoma Abdomen: Soft, non-tender, non-distended, BS + x 4.  Extremities: No clubbing, cyanosis or edema. DP/PT/Radials 2+ and equal bilaterally.  Accessory Clinical Findings  CBC  Recent Labs  12/23/14 0349  WBC 5.0  HGB 11.1*    HCT 32.7*  MCV 87.7  PLT 196*   Basic Metabolic Panel  Recent Labs  12/23/14 0349  NA 138  K 4.0  CL 105  CO2 25  GLUCOSE 127*  BUN 15  CREATININE 1.22  CALCIUM 8.9    TELE NSR with HR 60s    ECG  NSR with TWI in lateral leads  Echocardiogram 11/16/2014  LV EF: 35% -  40%  ------------------------------------------------------------------- Indications:   428.0 CHF.  ------------------------------------------------------------------- History:  PMH: Dilated cardiomyopathy. Coronary artery disease. Risk factors: Hypertension. Diabetes mellitus. Dyslipidemia.  ------------------------------------------------------------------- Study Conclusions  - Left ventricle: The cavity size was normal. There was mild focal basal hypertrophy of the septum. Systolic function was moderately reduced. The estimated ejection fraction was in the range of 35% to 40%. There is akinesis of the inferior myocardium. Doppler parameters are consistent with abnormal left ventricular relaxation (grade 1 diastolic dysfunction). Doppler parameters are consistent with high ventricular filling pressure. - Aortic valve: There was trivial regurgitation. - Aorta: Aortic root dimension: 38 mm (ED). - Ascending aorta: The ascending aorta was mildly dilated. - Mitral valve: Calcified annulus. - Left atrium: The atrium was mildly dilated.  Impressions:  - EF is improved when compared to  prior ECHO.     Radiology/Studies  No results found.  ASSESSMENT AND PLAN  1. CAD with chronic total occlusion of mid LCX and severe lesion of RCA on cath 05/2014  - cath 12/22/2014 90% stenosis treated with 3.0 x 32 Synergy DES postdilated to 3.6 mm, chronic total occlusion of mid LCx with L to L collateral.  -continue ASA and plavix, lisinopril, coreg, Imdur and statin.   2. HTN  3. HLD  4. DM  5. ICM with EF 35-40% on echo 11/2014 (improved from previous 15%): Imdur added, will  receive first dose today. May add hydralazine later on followup if BP can tolerates.   6. H/o subarachnoid hemorrhage: seen on followed up with neurosurgery who cleared him for DAPT  7. Mild thrombocytopenia: need to monitor  Signed, [name] Pager: [phone] Patient seen and examined and history reviewed. Agree with above findings and plan. Doing well post RCA stent. Doesn't remember anything about procedure. Long stent placed so would like to continue DAPT for one year providing no bleeding problems (history of SAH). Reassess LV function in 3 months after optimal medical therapy. If EF does not improve or if he remains symptomatic may need to consider for CTO PCI of the LCx. Unsure why platelet count has dropped. Will need to repeat as outpatient. Plan DC home today.  [name], [name] 12/23/2014 7:51 AM

## 2014-12-23 NOTE — Discharge Summary (Signed)
Discharge Summary   Patient ID: Jesse Woods,  MRN: [id number], DOB/AGE: January 18, 1951 64 y.o.  Admit date: 12/22/2014 Discharge date: 12/23/2014  Primary Care Provider: No PCP Per Patient Primary Cardiologist: Dr. Radford Pax  Discharge Diagnoses Active Problems:   Diabetes mellitus   Dyslipidemia   Essential hypertension   Chronic systolic CHF (congestive heart failure)   CAD (coronary artery disease), native coronary artery   Allergies No Known Allergies  Procedures  Cardiac catheterization 12/22/2014 Conclusion     Prox RCA to Mid RCA lesion, 90% stenosed. There is a 0% residual stenosis post intervention with a 3.0 x 32 Synergy drug-eluting stent, postdilated to 3.6 mm in diameter..  Chronic total occlusion of the mid circumflex with left to left collaterals.  Normal LVEDP. 5 mm Hg.  Continue dual antiplatelet therapy for at least a year ideally without interruption. If he does have any bleeding issues, particularly from his prior subarachnoid hemorrhage, would have to reconsider his dual antiplatelet therapy, but he would need this for at least 3 months. Given how long his stent is, I would prefer that he take dual antiplatelet therapy for the full year.  If his ejection fraction does not improve, would consider bringing him back for CTO PCI of the mid circumflex. This potentially would help his ejection fraction improved. Isosorbide has been started. If his blood pressure will tolerate, will add hydralazine. Hopefully, that will help with heart failure as well. We'll hold diuretics at this point due to the normal LVEDP. They may need to be restarted at a later time.  Cardiology follow-up with Dr. Radford Pax.      Hospital Course  The patient is a 64 year old male with past medical history of hypertension, hyperlipidemia, diabetes, remote history of subarachnoid hemorrhage, CAD, and a history of ischemic cardiomyopathy. He was noted to have chronic total occlusion of mid  left circumflex and severely short RCA of previous cath in February 2016. EF at that time was 15%, repeat echo in August 2016 shows EF has improved to 35-40%. He has been on coreg and ACE inhibitor. He was cleared by neurosurgery for DAPT. Patient was seen by Dr. Irish Lack in the clinic on 12/19/2014, after reviewing his previous cath film, he had agreed to undergo PCI of RCA. He was started on Plavix 75 mg daily after a 300 mg bolus on 9/12. Imdur was started with help to add hydralazine at a later time if he can tolerate it.  He proceeded to the scheduled outpatient cath on 12/22/2014 which showed 90% proximal to mid RCA lesion treated with 3.0 x 32 mm Synergy DES postdilated to 3.6 mm. Chronic total occlusion of mid left circumflex with left to left collaterals. Normal LVEDP 5 mmHg. He was seen on the following morning on 12/23/2014, at which time he was doing well without significant discomfort. Right groin cath site appears to be stable without significant bleeding. We will continue to hold his Lasix on discharge and discharge him on spironolactone only given low LVEDP. The plan is to add hydralazine on follow-up if his blood pressure is stable.  He did have an episode of jaw pain with ambulation in AM of 9/16, EKG was obtain and did not show significant change. We later ambulated him again, he did well without any chest or jaw discomfort. He is deemed stable for discharge. I have sent Rx for sublingual nitroglycerine to his pharmacy. He will continue on ASA and plavix. Outpatient cardiology followup has been arranged.   Discharge Vitals Blood  pressure 168/101, pulse 77, temperature 97.9 F (36.6 C), temperature source Oral, resp. rate 18, height 5\' 9"  (1.753 m), weight 208 lb 5.4 oz (94.5 kg), SpO2 97 %.  Filed Weights   12/22/14 0705 12/23/14 0350  Weight: 200 lb (90.719 kg) 208 lb 5.4 oz (94.5 kg)    Labs  CBC  Recent Labs  12/23/14 0349  WBC 5.0  HGB 11.1*  HCT 32.7*  MCV 87.7  PLT 136*    Basic Metabolic Panel  Recent Labs  12/23/14 0349  NA 138  K 4.0  CL 105  CO2 25  GLUCOSE 127*  BUN 15  CREATININE 1.22  CALCIUM 8.9    Disposition  Pt is being discharged home today in good condition.  Follow-up Plans & Appointments      Follow-up Information    Follow up with Richardson Dopp, PA-C On 01/13/2015.   Specialties:  Physician Assistant, Radiology, Interventional Cardiology   Why:  9:50am   Contact information:   [address] [place] [address] [phone]       Discharge Medications    Medication List    STOP taking these medications        furosemide 40 MG tablet  Commonly known as:  LASIX      TAKE these medications        aspirin 81 MG tablet  Take 81 mg by mouth daily.     carvedilol 3.125 MG tablet  Commonly known as:  COREG  Take 1 tablet (3.125 mg total) by mouth 2 (two) times daily.     clopidogrel 75 MG tablet  Commonly known as:  PLAVIX  Take 1 tablet (75 mg total) by mouth daily.     ezetimibe 10 MG tablet  Commonly known as:  ZETIA  Take 1 tablet (10 mg total) by mouth daily.     isosorbide mononitrate 30 MG 24 hr tablet  Commonly known as:  IMDUR  Take 1 tablet (30 mg total) by mouth daily.     lisinopril 20 MG tablet  Commonly known as:  PRINIVIL,ZESTRIL  Take 1 tablet (20 mg total) by mouth 2 (two) times daily.     metFORMIN 1000 MG tablet  Commonly known as:  GLUCOPHAGE  Take 1 tablet (1,000 mg total) by mouth 2 (two) times daily with a meal.     multivitamin with minerals tablet  Take 1 tablet by mouth daily.     nitroGLYCERIN 0.4 MG SL tablet  Commonly known as:  NITROSTAT  Place 1 tablet (0.4 mg total) under the tongue every 5 (five) minutes as needed for chest pain.     rosuvastatin 40 MG tablet  Commonly known as:  CRESTOR  Take 1 tablet (40 mg total) by mouth daily.     spironolactone 25 MG tablet  Commonly known as:  ALDACTONE  Take 0.5 tablets (12.5 mg total) by mouth  daily.     tetrahydrozoline-zinc 0.05-0.25 % ophthalmic solution  Commonly known as:  VISINE-AC  Place 1 drop into both eyes daily as needed (allergy irritation.).        Duration of Discharge Encounter   Greater than 30 minutes including physician time.  Hilbert Corrigan PA-C Pager: [phone] 12/23/2014, 12:55 PM

## 2015-01-07 DIAGNOSIS — I2510 Atherosclerotic heart disease of native coronary artery without angina pectoris: Secondary | ICD-10-CM

## 2015-01-07 HISTORY — DX: Atherosclerotic heart disease of native coronary artery without angina pectoris: I25.10

## 2015-01-12 NOTE — Progress Notes (Signed)
Cardiology Office Note   Date:  01/13/2015   ID:  Jesse Woods, DOB March 15, 1951, MRN [id number]  PCP:  No PCP Per Patient  Cardiologist:  Dr. Fransico Him   Electrophysiologist:  n/a  Chief Complaint  Patient presents with  . Hospitalization Follow-up    s/p PCI  . Coronary Artery Disease     History of Present Illness: Jesse Woods is a 64 y.o. male with a hx of hypertension, hyperlipidemia, diabetes, remote history of subarachnoid hemorrhage, CAD, and a history of ischemic cardiomyopathy. He was noted to have chronic total occlusion of mid left circumflex and severely disease RCA on previous cath in February 2016. EF at that time was 15%, repeat echo in August 2016 shows EF had improved to 35-40%.  He was cleared by neurosurgery for DAPT. Patient was seen by Dr. Irish Lack in the clinic on 12/19/2014, after reviewing his previous cath film, he had agreed to undergo PCI of RCA.   LHC 12/22/2014 showed 90% proximal to mid RCA lesion treated with 3.0 x 32 mm Synergy DES.  Chronic total occlusion of mid left circumflex with left to left collaterals. Normal LVEDP 5 mmHg. He was DC on spironolactone only given low LVEDP (Lasix was DC'd).   Returns for FU. Doing well since DC.  The patient denies chest pain, shortness of breath, syncope, orthopnea, PND or significant pedal edema.    Studies/Reports Reviewed Today:  LHC 12/22/14 LAD: The 25% LCx: Mid to distal 100%-CTO with left to left collaterals RCA: Proximal to mid 90% >>PCI: 3 x 32 mm Synergy DES     Echo 11/16/14 Mild focal basal hypertrophy of the septum, EF 35-40%, inferior AK, Gr 1 DD, trivial AI, Ao root mildly dilated (38 mm), MAC, mild LAE   Past Medical History  Diagnosis Date  . Hyperlipidemia   . Hypertension   . Subarachnoid hemorrhage ([place]) 1998    from HTN  . DCM (dilated cardiomyopathy) ([place]) 06/22/2014    EF 15%  . Coronary artery disease   . Diabetes mellitus, type II ([place])   . Arthritis     "back  and left hip" (12/22/2014)    Past Surgical History  Procedure Laterality Date  . Left heart catheterization with coronary angiogram N/A 06/24/2014    Procedure: LEFT HEART CATHETERIZATION WITH CORONARY ANGIOGRAM;  Surgeon: Jettie Booze, MD;  Location[facility] CATH LAB;  Service: Cardiovascular;  Laterality: N/A;  . Cardiac catheterization N/A 12/22/2014    Procedure: Coronary Stent Intervention;  Surgeon: Jettie Booze, MD;  Location: [place] CV LAB;  Service: Cardiovascular;  Laterality: N/A;  . Cardiac catheterization  12/22/2014    Procedure: Left Heart Cath and Coronary Angiography;  Surgeon: Jettie Booze, MD;  Location: [place] CV LAB;  Service: Cardiovascular;;  . Cardiac catheterization  06/24/2014  . Coronary angioplasty    . Inguinal hernia repair Right 1954  . Csf shunt  1998  . Brain surgery  1998    right frontal ventriculotomy with subsequent ventriculo-abfdominal shunt due to [facility]     Current Outpatient Prescriptions  Medication Sig Dispense Refill  . aspirin 81 MG tablet Take 81 mg by mouth daily.      . carvedilol (COREG) 3.125 MG tablet Take 1 tablet (3.125 mg total) by mouth 2 (two) times daily. 60 tablet 5  . clopidogrel (PLAVIX) 75 MG tablet Take 1 tablet (75 mg total) by mouth daily. 30 tablet 11  . ezetimibe (ZETIA) 10 MG tablet Take  1 tablet (10 mg total) by mouth daily. 30 tablet 11  . furosemide (LASIX) 40 MG tablet Take 40 mg by mouth daily.  0  . isosorbide mononitrate (IMDUR) 30 MG 24 hr tablet Take 1 tablet (30 mg total) by mouth daily. 30 tablet 11  . lisinopril (PRINIVIL,ZESTRIL) 20 MG tablet Take 1 tablet (20 mg total) by mouth 2 (two) times daily. 60 tablet 11  . metFORMIN (GLUCOPHAGE) 1000 MG tablet Take 1 tablet (1,000 mg total) by mouth 2 (two) times daily with a meal. 180 tablet 3  . Multiple Vitamins-Minerals (MULTIVITAMIN WITH MINERALS) tablet Take 1 tablet by mouth daily.      . nitroGLYCERIN (NITROSTAT) 0.4 MG SL tablet Place 1  tablet (0.4 mg total) under the tongue every 5 (five) minutes as needed for chest pain. 25 tablet 3  . rosuvastatin (CRESTOR) 40 MG tablet Take 1 tablet (40 mg total) by mouth daily. 30 tablet 11  . spironolactone (ALDACTONE) 25 MG tablet Take 0.5 tablets (12.5 mg total) by mouth daily. 30 tablet 3  . tetrahydrozoline-zinc (VISINE-AC) 0.05-0.25 % ophthalmic solution Place 1 drop into both eyes daily as needed (allergy irritation.).     No current facility-administered medications for this visit.    Allergies:   Review of patient's allergies indicates no known allergies.    Social History:  The patient  reports that he has never smoked. He has never used smokeless tobacco. He reports that he drinks alcohol. He reports that he does not use illicit drugs.   Family History:  The patient's family history includes Cancer in his brother, cousin, father, and mother; Diabetes in his father; Hypertension in his father; Pancreatic cancer in his mother; Prostate cancer in his father and paternal uncle.    ROS:   Please see the history of present illness.   Review of Systems  All other systems reviewed and are negative.     PHYSICAL EXAM: VS:  BP 142/108 mmHg  Pulse 72  Ht 5\' 9"  (1.753 m)  Wt 207 lb (93.895 kg)  BMI 30.55 kg/m2    Wt Readings from Last 3 Encounters:  01/13/15 207 lb (93.895 kg)  12/23/14 208 lb 5.4 oz (94.5 kg)  12/19/14 204 lb 12.8 oz (92.897 kg)     GEN: Well nourished, well developed, in no acute distress HEENT: normal Neck: no JVD,   no masses Cardiac:  Normal S1/S2, RRR; no murmur ,  no rubs or gallops, no edema; R groin without hematoma or bruit  Respiratory:  clear to auscultation bilaterally, no wheezing, rhonchi or rales. GI: soft, nontender, nondistended, + BS MS: no deformity or atrophy Skin: warm and dry  Neuro:  CNs II-XII intact, Strength and sensation are intact Psych: Normal affect   EKG:  EKG is ordered today.  It demonstrates:   NSR, HR 72, normal  axis, inferior Q waves, nonspecific ST-T wave changes, QTC 429 ms   Recent Labs: 03/15/2014: Pro B Natriuretic peptide (BNP) 658.0* 11/16/2014: ALT 26; TSH 1.58 12/23/2014: BUN 15; Creatinine, Ser 1.22; Hemoglobin 11.1*; Platelets 136*; Potassium 4.0; Sodium 138    Lipid Panel    Component Value Date/Time   CHOL 161 11/16/2014 0754   TRIG 143.0 11/16/2014 0754   HDL 32.60* 11/16/2014 0754   CHOLHDL 5 11/16/2014 0754   VLDL 28.6 11/16/2014 0754   LDLCALC 100* 11/16/2014 0754   LDLDIRECT 215.3 01/03/2011 1035      ASSESSMENT AND PLAN:  1. CAD: Status post recent DES to the  RCA. Known CTO of the LCx with left to left collaterals.  He is doing well without symptoms to suggest angina.  Continue ASA, Plavix, beta-blocker, ACE inhibitor, nitrates, spironolactone, statin.  If EF does not improved 90 days post PCI or he has symptoms to suggest angina, consider CTO PCI of LCx.    2. Ischemic Cardiomyopathy:  Mixed ischemic/non-ischemic CM.  Continue beta-blocker, ACE inhibitor, spironolactone, nitrates.  Check FU BMET.  BP too high.  Add Hydralazine 25 mg Three times a day. FU in a few weeks to titrate medications further.  3. Chronic Systolic CHF:  Volume stable.  He is NYHA 2-2b. Check FU BMET.  4. HTN:  Uncontrolled. Add Hydralazine as noted.    5. Hyperlipidemia:   Continue statin, Zetia.    6. Thrombocytopenia: Repeat CBC today.     Medication Changes: Current medicines are reviewed at length with the patient today.  Concerns regarding medicines are as outlined above.  The following changes have been made:   Discontinued Medications   No medications on file   Modified Medications   No medications on file   New Prescriptions   No medications on file   Labs/ tests ordered today include:   No orders of the defined types were placed in this encounter.     Disposition:    FU with me or Dr. Fransico Him 3 weeks.     Signed, Versie Starks, MHS 01/13/2015 10:03 AM      [facility] [address], [place], [place]  [address] Phone: [phone]; Fax: [phone]

## 2015-01-13 ENCOUNTER — Ambulatory Visit (INDEPENDENT_AMBULATORY_CARE_PROVIDER_SITE_OTHER): Payer: Commercial Managed Care - HMO | Admitting: Physician Assistant

## 2015-01-13 ENCOUNTER — Encounter: Payer: Self-pay | Admitting: Physician Assistant

## 2015-01-13 VITALS — BP 142/108 | HR 72 | Ht 69.0 in | Wt 207.0 lb

## 2015-01-13 DIAGNOSIS — I10 Essential (primary) hypertension: Secondary | ICD-10-CM | POA: Diagnosis not present

## 2015-01-13 DIAGNOSIS — E785 Hyperlipidemia, unspecified: Secondary | ICD-10-CM

## 2015-01-13 DIAGNOSIS — I255 Ischemic cardiomyopathy: Secondary | ICD-10-CM

## 2015-01-13 DIAGNOSIS — D696 Thrombocytopenia, unspecified: Secondary | ICD-10-CM | POA: Diagnosis not present

## 2015-01-13 DIAGNOSIS — I2510 Atherosclerotic heart disease of native coronary artery without angina pectoris: Secondary | ICD-10-CM

## 2015-01-13 DIAGNOSIS — I5022 Chronic systolic (congestive) heart failure: Secondary | ICD-10-CM

## 2015-01-13 LAB — CBC WITH DIFFERENTIAL/PLATELET
Basophils Absolute: 0.1 10*3/uL (ref 0.0–0.1)
Basophils Relative: 1 % (ref 0–1)
Eosinophils Absolute: 0.2 10*3/uL (ref 0.0–0.7)
Eosinophils Relative: 3 % (ref 0–5)
HEMATOCRIT: 35 % — AB (ref 39.0–52.0)
HEMOGLOBIN: 12.5 g/dL — AB (ref 13.0–17.0)
LYMPHS PCT: 21 % (ref 12–46)
Lymphs Abs: 1.1 10*3/uL (ref 0.7–4.0)
MCH: 30.1 pg (ref 26.0–34.0)
MCHC: 35.7 g/dL (ref 30.0–36.0)
MCV: 84.3 fL (ref 78.0–100.0)
MONO ABS: 0.4 10*3/uL (ref 0.1–1.0)
MPV: 10 fL (ref 8.6–12.4)
Monocytes Relative: 8 % (ref 3–12)
NEUTROS ABS: 3.6 10*3/uL (ref 1.7–7.7)
Neutrophils Relative %: 67 % (ref 43–77)
Platelets: 205 10*3/uL (ref 150–400)
RBC: 4.15 MIL/uL — AB (ref 4.22–5.81)
RDW: 12.6 % (ref 11.5–15.5)
WBC: 5.3 10*3/uL (ref 4.0–10.5)

## 2015-01-13 LAB — BASIC METABOLIC PANEL
BUN: 21 mg/dL (ref 7–25)
CHLORIDE: 104 mmol/L (ref 98–110)
CO2: 24 mmol/L (ref 20–31)
Calcium: 9.6 mg/dL (ref 8.6–10.3)
Creat: 1.23 mg/dL (ref 0.70–1.25)
GLUCOSE: 144 mg/dL — AB (ref 65–99)
POTASSIUM: 4.4 mmol/L (ref 3.5–5.3)
SODIUM: 140 mmol/L (ref 135–146)

## 2015-01-13 MED ORDER — HYDRALAZINE HCL 25 MG PO TABS: 25.0000 mg | ORAL_TABLET | Freq: Three times a day (TID) | ORAL | Status: DC

## 2015-01-13 NOTE — Patient Instructions (Signed)
Medication Instructions:  1. START HYDRALAZINE 25 MG 1 TABLET 3 TIMES DAILY; MAKE SURE TO SPACE OUT EVERY 8 HOURS  Labwork: TODAY BMET, CBC W/DIFF  Testing/Procedures: NONE  Follow-Up: 02/02/15 @ 11:30 SCOTT WEAVER, PAC   Any Other Special Instructions Will Be Listed Below (If Applicable).

## 2015-01-17 ENCOUNTER — Telehealth: Payer: Self-pay | Admitting: *Deleted

## 2015-01-17 NOTE — Telephone Encounter (Signed)
Pt notified of lab results by phone with verbal understanding.  

## 2015-02-01 NOTE — Progress Notes (Signed)
Cardiology Office Note   Date:  02/02/2015   ID:  [name], DOB [birth year], MRN [id number]  PCP:  No PCP Per Patient  Cardiologist:  Dr. Fransico Him   Electrophysiologist:  n/a  Chief Complaint  Patient presents with  . Follow-up  . Congestive Heart Failure     History of Present Illness: Jesse Woods is a 64 y.o. male with a hx of hypertension, hyperlipidemia, diabetes, remote history of subarachnoid hemorrhage, CAD, and a history of ischemic cardiomyopathy. He was noted to have chronic total occlusion of mid LCx and severely diseased RCA on previous cath in February 2016. EF at that time was 15%, repeat echo in August 2016 shows EF had improved to 35-40%.  He was cleared by neurosurgery for DAPT. Patient was seen by Dr. Irish Lack in the clinic in 9/16 and scheduled for PCI of RCA.   LHC 12/22/2014 showed 90% proximal to mid RCA lesion treated with 3.0 x 32 mm Synergy DES.  Chronic total occlusion of mid left circumflex with left to left collaterals. Normal LVEDP 5 mmHg. He was DC on spironolactone only given low LVEDP (Lasix was DC'd).   Last seen 01/13/15.  Returns for FU.  The patient denies chest pain, shortness of breath, syncope, orthopnea, PND or significant pedal edema. He has noted some tenderness to the touch over his L breast.   Studies/Reports Reviewed Today:  LHC 12/22/14 LAD: The 25% LCx: Mid to distal 100%-CTO with left to left collaterals RCA: Proximal to mid 90% >>PCI: 3 x 32 mm Synergy DES     Echo 11/16/14 Mild focal basal hypertrophy of the septum, EF 35-40%, inferior AK, Gr 1 DD, trivial AI, Ao root mildly dilated (38 mm), MAC, mild LAE   Past Medical History  Diagnosis Date  . Hyperlipidemia   . Hypertension   . Subarachnoid hemorrhage ([place]) 1998    from HTN  . DCM (dilated cardiomyopathy) (Jesse Woods) 06/22/2014    EF 15%  . Coronary artery disease   . Diabetes mellitus, type II ([place])   . Arthritis     "back and left hip" (12/22/2014)     Past Surgical History  Procedure Laterality Date  . Left heart catheterization with coronary angiogram N/A 06/24/2014    Procedure: LEFT HEART CATHETERIZATION WITH CORONARY ANGIOGRAM;  Surgeon: Jettie Booze, MD;  Location: [facility] CATH LAB;  Service: Cardiovascular;  Laterality: N/A;  . Cardiac catheterization N/A 12/22/2014    Procedure: Coronary Stent Intervention;  Surgeon: Jettie Booze, MD;  Location: [place] CV LAB;  Service: Cardiovascular;  Laterality: N/A;  . Cardiac catheterization  12/22/2014    Procedure: Left Heart Cath and Coronary Angiography;  Surgeon: Jettie Booze, MD;  Location: [place] CV LAB;  Service: Cardiovascular;;  . Cardiac catheterization  06/24/2014  . Coronary angioplasty    . Inguinal hernia repair Right 1954  . Csf shunt  1998  . Brain surgery  1998    right frontal ventriculotomy with subsequent ventriculo-abfdominal shunt due to [facility]     Current Outpatient Prescriptions  Medication Sig Dispense Refill  . aspirin 81 MG tablet Take 81 mg by mouth daily.      . carvedilol (COREG) 3.125 MG tablet Take 1 tablet (3.125 mg total) by mouth 2 (two) times daily. 60 tablet 5  . clopidogrel (PLAVIX) 75 MG tablet Take 1 tablet (75 mg total) by mouth daily. 30 tablet 11  . ezetimibe (ZETIA) 10 MG tablet Take 1 tablet (10 mg  total) by mouth daily. 30 tablet 11  . furosemide (LASIX) 40 MG tablet Take 40 mg by mouth daily.  0  . hydrALAZINE (APRESOLINE) 25 MG tablet Take 1 tablet (25 mg total) by mouth 3 (three) times daily. 90 tablet 11  . isosorbide mononitrate (IMDUR) 30 MG 24 hr tablet Take 1 tablet (30 mg total) by mouth daily. 30 tablet 11  . lisinopril (PRINIVIL,ZESTRIL) 20 MG tablet Take 1 tablet (20 mg total) by mouth 2 (two) times daily. 60 tablet 11  . metFORMIN (GLUCOPHAGE) 1000 MG tablet Take 1 tablet (1,000 mg total) by mouth 2 (two) times daily with a meal. 180 tablet 3  . Multiple Vitamins-Minerals (MULTIVITAMIN WITH MINERALS) tablet  Take 1 tablet by mouth daily.      . nitroGLYCERIN (NITROSTAT) 0.4 MG SL tablet Place 1 tablet (0.4 mg total) under the tongue every 5 (five) minutes as needed for chest pain. 25 tablet 3  . rosuvastatin (CRESTOR) 40 MG tablet Take 1 tablet (40 mg total) by mouth daily. 30 tablet 11  . spironolactone (ALDACTONE) 25 MG tablet Take 0.5 tablets (12.5 mg total) by mouth daily. 30 tablet 3  . tetrahydrozoline-zinc (VISINE-AC) 0.05-0.25 % ophthalmic solution Place 1 drop into both eyes daily as needed (allergy irritation.).     No current facility-administered medications for this visit.    Allergies:   Review of patient's allergies indicates no known allergies.    Social History:  The patient  reports that he has never smoked. He has never used smokeless tobacco. He reports that he drinks alcohol. He reports that he does not use illicit drugs.   Family History:  The patient's family history includes Cancer in his brother, cousin, father, and mother; Diabetes in his father; Hypertension in his father; Pancreatic cancer in his mother; Prostate cancer in his father and paternal uncle.    ROS:   Please see the history of present illness.   Review of Systems  All other systems reviewed and are negative.     PHYSICAL EXAM: VS:  BP 102/78 mmHg  Pulse 82  Ht 5\' 9"  (1.753 m)  Wt 204 lb 1.9 oz (92.588 kg)  BMI 30.13 kg/m2    Wt Readings from Last 3 Encounters:  02/02/15 204 lb 1.9 oz (92.588 kg)  01/13/15 207 lb (93.895 kg)  12/23/14 208 lb 5.4 oz (94.5 kg)     GEN: Well nourished, well developed, in no acute distress HEENT: normal Neck: no JVD,   no masses Cardiac:  Normal S1/S2, RRR; no murmur ,  no rubs or gallops, no edema Respiratory:  clear to auscultation bilaterally, no wheezing, rhonchi or rales. GI: soft, nontender, nondistended, + BS MS: no deformity or atrophy Skin: warm and dry  Neuro:  CNs II-XII intact, Strength and sensation are intact Psych: Normal affect   EKG:   EKG is ordered today.  It demonstrates:   NSR, HR 82, NSSTTW changes, no change from prior tracing   Recent Labs: 03/15/2014: Pro B Natriuretic peptide (BNP) 658.0* 11/16/2014: ALT 26; TSH 1.58 01/13/2015: BUN 21; Creat 1.23; Hemoglobin 12.5*; Platelets 205; Potassium 4.4; Sodium 140    Lipid Panel    Component Value Date/Time   CHOL 161 11/16/2014 0754   TRIG 143.0 11/16/2014 0754   HDL 32.60* 11/16/2014 0754   CHOLHDL 5 11/16/2014 0754   VLDL 28.6 11/16/2014 0754   LDLCALC 100* 11/16/2014 0754   LDLDIRECT 215.3 01/03/2011 1035      ASSESSMENT AND PLAN:  1.  CAD:  S/p recent DES to the RCA. Known CTO of the LCx with left to left collaterals.  Doing well without angina.  Continue ASA, Plavix, beta-blocker, ACE inhibitor, nitrates, spironolactone, statin.  If EF does not improved 90 days post PCI or he has symptoms to suggest angina, consider CTO PCI of LCx.     2. Ischemic Cardiomyopathy:  Mixed ischemic/non-ischemic CM.  Continue beta-blocker, ACE inhibitor, spironolactone, nitrates, Hydralazine.  He is having some breast tenderness.  This may be from Spironolactone. I advised him to contact us if it gets worse. Will need to change Spironolactone to Eplerenone.  Arrange FU Echo 90 days post PCI to recheck EF.   3. Chronic Systolic CHF:  Volume stable.  He is NYHA 2-2b.    4. HTN:  Controlled.  Continue current Rx.   5. Hyperlipidemia:   Continue statin, Zetia.  Check FU Lipids and LFTs.      Medication Changes: Current medicines are reviewed at length with the patient today.  Concerns regarding medicines are as outlined above.  The following changes have been made:   Discontinued Medications   No medications on file   Modified Medications   No medications on file   New Prescriptions   No medications on file   Labs/ tests ordered today include:   No orders of the defined types were placed in this encounter.     Disposition:    FU Dr. Fransico Him  04/2015.    Signed, Versie Starks, MHS 02/02/2015 11:10 AM    [facility] [place], [place], [place]  [address] Phone: [phone]; Fax: [phone]

## 2015-02-02 ENCOUNTER — Encounter: Payer: Self-pay | Admitting: Physician Assistant

## 2015-02-02 ENCOUNTER — Ambulatory Visit (INDEPENDENT_AMBULATORY_CARE_PROVIDER_SITE_OTHER): Payer: Commercial Managed Care - HMO | Admitting: Physician Assistant

## 2015-02-02 VITALS — BP 102/78 | HR 82 | Ht 69.0 in | Wt 204.1 lb

## 2015-02-02 DIAGNOSIS — I5022 Chronic systolic (congestive) heart failure: Secondary | ICD-10-CM | POA: Diagnosis not present

## 2015-02-02 DIAGNOSIS — E785 Hyperlipidemia, unspecified: Secondary | ICD-10-CM

## 2015-02-02 DIAGNOSIS — I10 Essential (primary) hypertension: Secondary | ICD-10-CM

## 2015-02-02 DIAGNOSIS — I255 Ischemic cardiomyopathy: Secondary | ICD-10-CM | POA: Diagnosis not present

## 2015-02-02 DIAGNOSIS — I2510 Atherosclerotic heart disease of native coronary artery without angina pectoris: Secondary | ICD-10-CM

## 2015-02-02 LAB — HEPATIC FUNCTION PANEL
ALBUMIN: 4.4 g/dL (ref 3.6–5.1)
ALT: 24 U/L (ref 9–46)
AST: 27 U/L (ref 10–35)
Alkaline Phosphatase: 39 U/L — ABNORMAL LOW (ref 40–115)
BILIRUBIN DIRECT: 0.2 mg/dL (ref <=0.2)
BILIRUBIN INDIRECT: 0.3 mg/dL (ref 0.2–1.2)
TOTAL PROTEIN: 7.6 g/dL (ref 6.1–8.1)
Total Bilirubin: 0.5 mg/dL (ref 0.2–1.2)

## 2015-02-02 LAB — LIPID PANEL
CHOLESTEROL: 92 mg/dL — AB (ref 125–200)
HDL: 28 mg/dL — ABNORMAL LOW (ref >=40)
LDL Cholesterol: 48 mg/dL (ref <130)
Total CHOL/HDL Ratio: 3.3 Ratio (ref <=5.0)
Triglycerides: 82 mg/dL (ref <150)
VLDL: 16 mg/dL (ref <30)

## 2015-02-02 NOTE — Patient Instructions (Signed)
Medication Instructions:  No changes.  Continue your current medications.  Labwork: Today:  Lipids and LFTs (Dx Hyperlipidemia)  Testing/Procedures: Schedule Echocardiogram for AFTER 03/23/15.  Your physician has requested that you have an echocardiogram. Echocardiography is a painless test that uses sound waves to create images of your heart. It provides your doctor with information about the size and shape of your heart and how well your heart's chambers and valves are working. This procedure takes approximately one hour. There are no restrictions for this procedure.    Follow-Up: Dr. Fransico Him in 04/2015.  Any Other Special Instructions Will Be Listed Below (If Applicable).

## 2015-02-03 ENCOUNTER — Telehealth: Payer: Self-pay | Admitting: *Deleted

## 2015-02-03 NOTE — Telephone Encounter (Signed)
Pt notified of lab results by phone with verbal understanding. Pt said thank you for the good news.

## 2015-02-15 ENCOUNTER — Other Ambulatory Visit: Payer: Self-pay

## 2015-02-15 MED ORDER — SPIRONOLACTONE 25 MG PO TABS: 12.5000 mg | ORAL_TABLET | Freq: Every day | ORAL | Status: DC

## 2015-02-27 ENCOUNTER — Encounter: Payer: Self-pay | Admitting: Cardiology

## 2015-02-27 ENCOUNTER — Encounter: Payer: Self-pay | Admitting: Physician Assistant

## 2015-03-28 ENCOUNTER — Ambulatory Visit (HOSPITAL_COMMUNITY): Payer: Commercial Managed Care - HMO | Attending: Internal Medicine

## 2015-03-28 ENCOUNTER — Encounter (HOSPITAL_COMMUNITY): Payer: Self-pay | Admitting: Radiology

## 2015-03-28 DIAGNOSIS — R0989 Other specified symptoms and signs involving the circulatory and respiratory systems: Secondary | ICD-10-CM

## 2015-04-28 ENCOUNTER — Ambulatory Visit (INDEPENDENT_AMBULATORY_CARE_PROVIDER_SITE_OTHER): Payer: Commercial Managed Care - HMO | Admitting: Physician Assistant

## 2015-04-28 ENCOUNTER — Ambulatory Visit: Payer: Commercial Managed Care - HMO | Admitting: Cardiology

## 2015-04-28 ENCOUNTER — Encounter: Payer: Self-pay | Admitting: Physician Assistant

## 2015-04-28 VITALS — BP 120/74 | HR 88 | Ht 69.0 in | Wt 202.8 lb

## 2015-04-28 DIAGNOSIS — I2510 Atherosclerotic heart disease of native coronary artery without angina pectoris: Secondary | ICD-10-CM | POA: Diagnosis not present

## 2015-04-28 DIAGNOSIS — I5022 Chronic systolic (congestive) heart failure: Secondary | ICD-10-CM

## 2015-04-28 DIAGNOSIS — I10 Essential (primary) hypertension: Secondary | ICD-10-CM | POA: Diagnosis not present

## 2015-04-28 DIAGNOSIS — E785 Hyperlipidemia, unspecified: Secondary | ICD-10-CM | POA: Diagnosis not present

## 2015-04-28 NOTE — Patient Instructions (Addendum)
Medication Instructions:  Your physician recommends that you continue on your current medications as directed. Please refer to the Current Medication list given to you today.   Labwork: None ordered  Testing/Procedures: None ordered  Follow-Up: Your physician wants you to follow-up in: 6 MONTHS WITH DR. TURNER.  You will receive a reminder letter in the mail two months in advance. If you don't receive a letter, please call our office to schedule the follow-up appointment.    Any Other Special Instructions Will Be Listed Below (If Applicable).    If you need a refill on your cardiac medications before your next appointment, please call your pharmacy.   

## 2015-04-28 NOTE — Progress Notes (Signed)
Patient ID: Jesse Woods, male   DOB: May 14, 1950, 65 y.o.   MRN: [id number]    Date:  04/28/2015   ID:  Jesse Woods, DOB [birth year], MRN [id number]  PCP:  Cathlean Cower, MD  Primary Cardiologist:  turner   Chief Complaint  Patient presents with  . Coronary Artery Disease    f/u     History of Present Illness: Jesse Woods is a 65 y.o. male  with a hx of hypertension, hyperlipidemia, diabetes, remote history of subarachnoid hemorrhage, CAD, and a history of ischemic cardiomyopathy. He was noted to have chronic total occlusion of mid LCx and severely diseased RCA on previous cath in February 2016. EF at that time was 15%, repeat echo in August 2016 shows EF had improved to 35-40%. He was cleared by neurosurgery for DAPT. Patient was seen by Dr. Irish Lack in the clinic in 9/16 and scheduled for PCI of RCA.   LHC 12/22/2014 showed 90% proximal to mid RCA lesion treated with 3.0 x 32 mm Synergy DES. Chronic total occlusion of mid left circumflex with left to left collaterals. Normal LVEDP 5 mmHg. He was DC on spironolactone only given low LVEDP (Lasix was DC'd).   The patient presents for follow-up evaluation. He reports doing well and has no particular complaints other than some left nipple tenderness.   No discharge.  He does think he may have bumped into something. Started about a week ago.  The patient currently denies nausea, vomiting, fever, chest pain, shortness of breath, orthopnea, dizziness, PND, cough, congestion, abdominal pain, hematochezia, melena, lower extremity edema, claudication.   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  He is taking all medications as prescribed  LHC 12/22/14 LAD: The 25% LCx: Mid to distal 100%-CTO with left to left collaterals RCA: Proximal to mid 90% >>PCI: 3 x 32 mm Synergy DES    Wt Readings from Last 3 Encounters:  04/28/15 202 lb 12.8 oz (91.989 kg)  02/02/15 204 lb 1.9 oz (92.588 kg)  01/13/15 207 lb (93.895 kg)     Past Medical  History  Diagnosis Date  . Hyperlipidemia   . Hypertension   . Subarachnoid hemorrhage ([place]) 1998    from HTN  . DCM (dilated cardiomyopathy) ([place]) 06/22/2014    EF 15%  . Coronary artery disease   . Diabetes mellitus, type II ([place])   . Arthritis     "back and left hip" (12/22/2014)    Current Outpatient Prescriptions  Medication Sig Dispense Refill  . aspirin 81 MG tablet Take 81 mg by mouth daily.      . carvedilol (COREG) 3.125 MG tablet Take 1 tablet (3.125 mg total) by mouth 2 (two) times daily. 60 tablet 5  . clopidogrel (PLAVIX) 75 MG tablet Take 1 tablet (75 mg total) by mouth daily. 30 tablet 11  . ezetimibe (ZETIA) 10 MG tablet Take 1 tablet (10 mg total) by mouth daily. 30 tablet 11  . furosemide (LASIX) 40 MG tablet Take 40 mg by mouth daily.  0  . hydrALAZINE (APRESOLINE) 25 MG tablet Take 1 tablet (25 mg total) by mouth 3 (three) times daily. 90 tablet 11  . isosorbide mononitrate (IMDUR) 30 MG 24 hr tablet Take 1 tablet (30 mg total) by mouth daily. 30 tablet 11  . lisinopril (PRINIVIL,ZESTRIL) 20 MG tablet Take 1 tablet (20 mg total) by mouth 2 (two) times daily. 60 tablet 11  . metFORMIN (GLUCOPHAGE) 1000 MG tablet Take 1 tablet (1,000 mg  total) by mouth 2 (two) times daily with a meal. 180 tablet 3  . Multiple Vitamins-Minerals (MULTIVITAMIN WITH MINERALS) tablet Take 1 tablet by mouth daily.      . nitroGLYCERIN (NITROSTAT) 0.4 MG SL tablet Place 0.4 mg under the tongue every 5 (five) minutes as needed for chest pain (x 3 tabs).    . rosuvastatin (CRESTOR) 40 MG tablet Take 1 tablet (40 mg total) by mouth daily. 30 tablet 11  . spironolactone (ALDACTONE) 25 MG tablet Take 0.5 tablets (12.5 mg total) by mouth daily. 30 tablet 3  . tetrahydrozoline-zinc (VISINE-AC) 0.05-0.25 % ophthalmic solution Place 1 drop into both eyes daily as needed (allergy irritation.).     No current facility-administered medications for this visit.    Allergies:   No Known  Allergies  Social History:  The patient  reports that he has never smoked. He has never used smokeless tobacco. He reports that he drinks alcohol. He reports that he does not use illicit drugs.   Family history:   Family History  Problem Relation Age of Onset  . Cancer Mother     pancreatic  . Pancreatic cancer Mother   . Diabetes Father   . Hypertension Father   . Cancer Father     prostate cancer  . Prostate cancer Father   . Cancer Brother     prostate cancer - cause of death  . Cancer Cousin     prostate cancer  . Prostate cancer Paternal Uncle     ROS:  Please see the history of present illness.  All other systems reviewed and negative.   PHYSICAL EXAM: VS:  BP 120/74 mmHg  Pulse 88  Ht 5\' 9"  (1.753 m)  Wt 202 lb 12.8 oz (91.989 kg)  BMI 29.93 kg/m2  SpO2 97% Obese, well developed, in no acute distress HEENT: Pupils are equal round react to light accommodation extraocular movements are intact.  Neck: no JVDNo cervical lymphadenopathy. Cardiac: Regular rate and rhythm without murmurs rubs or gallops. Lungs:  clear to auscultation bilaterally, no wheezing, rhonchi or rales Abd: soft, nontender, positive bowel sounds all quadrants, no hepatosplenomegaly Ext: no lower extremity edema.  2+ radial and dorsalis pedis pulses. Skin: warm and dry Neuro:  Grossly normal Lipid Panel     Component Value Date/Time   CHOL 92* 02/02/2015 1145   TRIG 82 02/02/2015 1145   HDL 28* 02/02/2015 1145   CHOLHDL 3.3 02/02/2015 1145   VLDL 16 02/02/2015 1145   LDLCALC 48 02/02/2015 1145   LDLDIRECT 215.3 01/03/2011 1035       ASSESSMENT AND PLAN:  Problem List Items Addressed This Visit    Essential hypertension   Dyslipidemia   Chronic systolic CHF (congestive heart failure) ([place]) - Primary   CAD (coronary artery disease), native coronary artery      Mr. Topf is doing well. We discussed starting to exercise and eating a low carb diet. His blood pressure is controlled. No  changes to medications.Continue aspirin and Plavix beta blocker Zetia,  lisinopril and statin.   Last LDL was 48.  He appears euvolemic.  Recommended he follow-up with his primary care doctor regarding left nipple tenderness does not improve.

## 2015-06-03 ENCOUNTER — Other Ambulatory Visit: Payer: Self-pay | Admitting: Cardiology

## 2015-06-26 ENCOUNTER — Other Ambulatory Visit: Payer: Self-pay

## 2015-06-26 MED ORDER — LISINOPRIL 20 MG PO TABS: 20.0000 mg | ORAL_TABLET | Freq: Two times a day (BID) | ORAL | Status: DC

## 2015-08-22 ENCOUNTER — Telehealth: Payer: Self-pay

## 2015-08-22 ENCOUNTER — Other Ambulatory Visit: Payer: Self-pay | Admitting: *Deleted

## 2015-08-22 MED ORDER — METFORMIN HCL 1000 MG PO TABS: 1000.0000 mg | ORAL_TABLET | Freq: Two times a day (BID) | ORAL | Status: DC

## 2015-08-22 NOTE — Telephone Encounter (Signed)
Patient called for a refill on metformin. I made him aware that we do not normally refill this medication as we are cardiology. He stated that Dr Irish Lack usually refills this for him. Please advise. Thanks, MI

## 2015-08-22 NOTE — Telephone Encounter (Signed)
Per Dr. Irish Lack, ok to fill metformin, but after this, he needs to get from PCP

## 2015-08-22 NOTE — Telephone Encounter (Signed)
Please advise if it is ok to allow refill.

## 2015-11-29 ENCOUNTER — Ambulatory Visit (INDEPENDENT_AMBULATORY_CARE_PROVIDER_SITE_OTHER): Payer: Commercial Managed Care - HMO

## 2015-11-29 VITALS — BP 122/80 | HR 90 | Ht 68.0 in | Wt 198.8 lb

## 2015-11-29 DIAGNOSIS — Z0000 Encounter for general adult medical examination without abnormal findings: Secondary | ICD-10-CM | POA: Diagnosis not present

## 2015-11-29 NOTE — Patient Instructions (Addendum)
Jesse Woods , Thank you for taking time to come for your Medicare Wellness Visit. I appreciate your ongoing commitment to your health goals. Please review the following plan we discussed and let me know if I can assist you in the future.   Will review [employer] [employer] offers free advance directive forms, as well as assistance in completing the forms themselves. For assistance, contact the Spiritual Care Department at [phone], or the Clinical Social Work Department at [phone].  Eye exam scheduled for Sept 6th   Colonoscopy to be schedule this year in November   [employer]: Educated to check with insurance regarding coverage of Shingles vaccination on Part D or Part B and may have lower co-pay if provided on the Part D side    These are the goals we discussed: Goals    . patient           Stay single; play basketball x 2 days a week         This is a list of the screening recommended for you and due dates:  Health Maintenance  Topic Date Due  .  Hepatitis C: One time screening is recommended by Center for Disease Control  (CDC) for  adults born from [birth year] through 1967.   December 28, 1950  . Shingles Vaccine  12/03/2008  . Complete foot exam   04/28/2013  . Hemoglobin A1C  05/19/2015  . Flu Shot  11/07/2015  . Eye exam for diabetics  11/30/2015  . Pneumonia vaccines (2 of 2 - PCV13) 12/23/2015  . Colon Cancer Screening  02/07/2016  . Tetanus Vaccine  11/18/2019      Fat and Cholesterol Restricted Diet High levels of fat and cholesterol in your blood may lead to various health problems, such as diseases of the heart, blood vessels, gallbladder, liver, and pancreas. Fats are concentrated sources of energy that come in various forms. Certain types of fat, including saturated fat, may be harmful in excess. Cholesterol is a substance needed by your body in small amounts. Your body makes all the cholesterol it needs. Excess cholesterol comes from the food you eat. When you  have high levels of cholesterol and saturated fat in your blood, health problems can develop because the excess fat and cholesterol will gather along the walls of your blood vessels, causing them to narrow. Choosing the right foods will help you control your intake of fat and cholesterol. This will help keep the levels of these substances in your blood within normal limits and reduce your risk of disease. WHAT IS MY PLAN? Your health care provider recommends that you:  Get no more than __________ % of the total calories in your daily diet from fat.  Limit your intake of saturated fat to less than ______% of your total calories each day.  Limit the amount of cholesterol in your diet to less than _________mg per day. WHAT TYPES OF FAT SHOULD I CHOOSE?  Choose healthy fats more often. Choose monounsaturated and polyunsaturated fats, such as olive and canola oil, flaxseeds, walnuts, almonds, and seeds.  Eat more omega-3 fats. Good choices include salmon, mackerel, sardines, tuna, flaxseed oil, and ground flaxseeds. Aim to eat fish at least two times a week.  Limit saturated fats. Saturated fats are primarily found in animal products, such as meats, butter, and cream. Plant sources of saturated fats include palm oil, palm kernel oil, and coconut oil.  Avoid foods with partially hydrogenated oils in them. These contain trans fats. Examples of foods  that contain trans fats are stick margarine, some tub margarines, cookies, crackers, and other baked goods. WHAT GENERAL GUIDELINES DO I NEED TO FOLLOW? These guidelines for healthy eating will help you control your intake of fat and cholesterol:  Check food labels carefully to identify foods with trans fats or high amounts of saturated fat.  Fill one half of your plate with vegetables and green salads.  Fill one fourth of your plate with whole grains. Look for the word "whole" as the first word in the ingredient list.  Fill one fourth of your plate  with lean protein foods.  Limit fruit to two servings a day. Choose fruit instead of juice.  Eat more foods that contain soluble fiber. Examples of foods that contain this type of fiber are apples, broccoli, carrots, beans, peas, and barley. Aim to get 20-30 g of fiber per day.  Eat more home-cooked food and less restaurant, buffet, and fast food.  Limit or avoid alcohol.  Limit foods high in starch and sugar.  Limit fried foods.  Cook foods using methods other than frying. Baking, boiling, grilling, and broiling are all great options.  Lose weight if you are overweight. Losing just 5-10% of your initial body weight can help your overall health and prevent diseases such as diabetes and heart disease. WHAT FOODS CAN I EAT? Grains Whole grains, such as whole wheat or whole grain breads, crackers, cereals, and pasta. Unsweetened oatmeal, bulgur, barley, quinoa, or brown rice. Corn or whole wheat flour tortillas. Vegetables Fresh or frozen vegetables (raw, steamed, roasted, or grilled). Green salads. Fruits All fresh, canned (in natural juice), or frozen fruits. Meat and Other Protein Products Ground beef (85% or leaner), grass-fed beef, or beef trimmed of fat. Skinless chicken or [place]. Ground chicken or [place]. Pork trimmed of fat. All fish and seafood. Eggs. Dried beans, peas, or lentils. Unsalted nuts or seeds. Unsalted canned or dry beans. Dairy Low-fat dairy products, such as skim or 1% milk, 2% or reduced-fat cheeses, low-fat ricotta or cottage cheese, or plain low-fat yogurt. Fats and Oils Tub margarines without trans fats. Light or reduced-fat mayonnaise and salad dressings. Avocado. Olive, canola, sesame, or safflower oils. Natural peanut or almond butter (choose ones without added sugar and oil). The items listed above may not be a complete list of recommended foods or beverages. Contact your dietitian for more options. WHAT FOODS ARE NOT RECOMMENDED? Grains White bread.  White pasta. White rice. Cornbread. Bagels, pastries, and croissants. Crackers that contain trans fat. Vegetables White potatoes. Corn. Creamed or fried vegetables. Vegetables in a cheese sauce. Fruits Dried fruits. Canned fruit in light or heavy syrup. Fruit juice. Meat and Other Protein Products Fatty cuts of meat. Ribs, chicken wings, bacon, sausage, bologna, salami, chitterlings, fatback, hot dogs, bratwurst, and packaged luncheon meats. Liver and organ meats. Dairy Whole or 2% milk, cream, half-and-half, and cream cheese. Whole milk cheeses. Whole-fat or sweetened yogurt. Full-fat cheeses. Nondairy creamers and whipped toppings. Processed cheese, cheese spreads, or cheese curds. Sweets and Desserts Corn syrup, sugars, honey, and molasses. Candy. Jam and jelly. Syrup. Sweetened cereals. Cookies, pies, cakes, donuts, muffins, and ice cream. Fats and Oils Butter, stick margarine, lard, shortening, ghee, or bacon fat. Coconut, palm kernel, or palm oils. Beverages Alcohol. Sweetened drinks (such as sodas, lemonade, and fruit drinks or punches). The items listed above may not be a complete list of foods and beverages to avoid. Contact your dietitian for more information.   This information is not intended to  replace advice given to you by your health care provider. Make sure you discuss any questions you have with your health care provider.   Document Released: 03/25/2005 Document Revised: 04/15/2014 Document Reviewed: 06/23/2013 Elsevier Interactive Patient Education 2016 [place] in the Home  Falls can cause injuries. They can happen to people of all ages. There are many things you can do to make your home safe and to help prevent falls.  WHAT CAN I DO ON THE OUTSIDE OF MY HOME?  Regularly fix the edges of walkways and driveways and fix any cracks.  Remove anything that might make you trip as you walk through a door, such as a raised step or threshold.  Trim  any bushes or trees on the path to your home.  Use bright outdoor lighting.  Clear any walking paths of anything that might make someone trip, such as rocks or tools.  Regularly check to see if handrails are loose or broken. Make sure that both sides of any steps have handrails.  Any raised decks and porches should have guardrails on the edges.  Have any leaves, snow, or ice cleared regularly.  Use sand or salt on walking paths during winter.  Clean up any spills in your garage right away. This includes oil or grease spills. WHAT CAN I DO IN THE BATHROOM?   Use night lights.  Install grab bars by the toilet and in the tub and shower. Do not use towel bars as grab bars.  Use non-skid mats or decals in the tub or shower.  If you need to sit down in the shower, use a plastic, non-slip stool.  Keep the floor dry. Clean up any water that spills on the floor as soon as it happens.  Remove soap buildup in the tub or shower regularly.  Attach bath mats securely with double-sided non-slip rug tape.  Do not have throw rugs and other things on the floor that can make you trip. WHAT CAN I DO IN THE BEDROOM?  Use night lights.  Make sure that you have a light by your bed that is easy to reach.  Do not use any sheets or blankets that are too big for your bed. They should not hang down onto the floor.  Have a firm chair that has side arms. You can use this for support while you get dressed.  Do not have throw rugs and other things on the floor that can make you trip. WHAT CAN I DO IN THE KITCHEN?  Clean up any spills right away.  Avoid walking on wet floors.  Keep items that you use a lot in easy-to-reach places.  If you need to reach something above you, use a strong step stool that has a grab bar.  Keep electrical cords out of the way.  Do not use floor polish or wax that makes floors slippery. If you must use wax, use non-skid floor wax.  Do not have throw rugs and other  things on the floor that can make you trip. WHAT CAN I DO WITH MY STAIRS?  Do not leave any items on the stairs.  Make sure that there are handrails on both sides of the stairs and use them. Fix handrails that are broken or loose. Make sure that handrails are as long as the stairways.  Check any carpeting to make sure that it is firmly attached to the stairs. Fix any carpet that is loose or worn.  Avoid having throw  rugs at the top or bottom of the stairs. If you do have throw rugs, attach them to the floor with carpet tape.  Make sure that you have a light switch at the top of the stairs and the bottom of the stairs. If you do not have them, ask someone to add them for you. WHAT ELSE CAN I DO TO HELP PREVENT FALLS?  Wear shoes that:  Do not have high heels.  Have rubber bottoms.  Are comfortable and fit you well.  Are closed at the toe. Do not wear sandals.  If you use a stepladder:  Make sure that it is fully opened. Do not climb a closed stepladder.  Make sure that both sides of the stepladder are locked into place.  Ask someone to hold it for you, if possible.  Clearly mark and make sure that you can see:  Any grab bars or handrails.  First and last steps.  Where the edge of each step is.  Use tools that help you move around (mobility aids) if they are needed. These include:  Canes.  Walkers.  Scooters.  Crutches.  Turn on the lights when you go into a dark area. Replace any light bulbs as soon as they burn out.  Set up your furniture so you have a clear path. Avoid moving your furniture around.  If any of your floors are uneven, fix them.  If there are any pets around you, be aware of where they are.  Review your medicines with your doctor. Some medicines can make you feel dizzy. This can increase your chance of falling. Ask your doctor what other things that you can do to help prevent falls.   This information is not intended to replace advice given  to you by your health care provider. Make sure you discuss any questions you have with your health care provider.   Document Released: 01/19/2009 Document Revised: 08/09/2014 Document Reviewed: 04/29/2014 Elsevier Interactive Patient Education 2016 [place] Maintenance, Male A healthy lifestyle and preventative care can promote health and wellness.  Maintain regular health, dental, and eye exams.  Eat a healthy diet. Foods like vegetables, fruits, whole grains, low-fat dairy products, and lean protein foods contain the nutrients you need and are low in calories. Decrease your intake of foods high in solid fats, added sugars, and salt. Get information about a proper diet from your health care provider, if necessary.  Regular physical exercise is one of the most important things you can do for your health. Most adults should get at least 150 minutes of moderate-intensity exercise (any activity that increases your heart rate and causes you to sweat) each week. In addition, most adults need muscle-strengthening exercises on 2 or more days a week.   Maintain a healthy weight. The body mass index (BMI) is a screening tool to identify possible weight problems. It provides an estimate of body fat based on height and weight. Your health care provider can find your BMI and can help you achieve or maintain a healthy weight. For males 20 years and older:  A BMI below 18.5 is considered underweight.  A BMI of 18.5 to 24.9 is normal.  A BMI of 25 to 29.9 is considered overweight.  A BMI of 30 and above is considered obese.  Maintain normal blood lipids and cholesterol by exercising and minimizing your intake of saturated fat. Eat a balanced diet with plenty of fruits and vegetables. Blood tests for lipids and cholesterol should begin  at age 31 and be repeated every 5 years. If your lipid or cholesterol levels are high, you are over age 46, or you are at high risk for heart disease, you may  need your cholesterol levels checked more frequently.Ongoing high lipid and cholesterol levels should be treated with medicines if diet and exercise are not working.  If you smoke, find out from your health care provider how to quit. If you do not use tobacco, do not start.  Lung cancer screening is recommended for adults aged 92-80 years who are at high risk for developing lung cancer because of a history of smoking. A yearly low-dose CT scan of the lungs is recommended for people who have at least a 30-pack-year history of smoking and are current smokers or have quit within the past 15 years. A pack year of smoking is smoking an average of 1 pack of cigarettes a day for 1 year (for example, a 30-pack-year history of smoking could mean smoking 1 pack a day for 30 years or 2 packs a day for 15 years). Yearly screening should continue until the smoker has stopped smoking for at least 15 years. Yearly screening should be stopped for people who develop a health problem that would prevent them from having lung cancer treatment.  If you choose to drink alcohol, do not have more than 2 drinks per day. One drink is considered to be 12 oz (360 mL) of beer, 5 oz (150 mL) of wine, or 1.5 oz (45 mL) of liquor.  Avoid the use of street drugs. Do not share needles with anyone. Ask for help if you need support or instructions about stopping the use of drugs.  High blood pressure causes heart disease and increases the risk of stroke. High blood pressure is more likely to develop in:  People who have blood pressure in the end of the normal range (100-139/85-89 mm Hg).  People who are overweight or obese.  People who are African American.  If you are 62-35 years of age, have your blood pressure checked every 3-5 years. If you are 42 years of age or older, have your blood pressure checked every year. You should have your blood pressure measured twice--once when you are at a hospital or clinic, and once when you are  not at a hospital or clinic. Record the average of the two measurements. To check your blood pressure when you are not at a hospital or clinic, you can use:  An automated blood pressure machine at a pharmacy.  A home blood pressure monitor.  If you are 8-87 years old, ask your health care provider if you should take aspirin to prevent heart disease.  Diabetes screening involves taking a blood sample to check your fasting blood sugar level. This should be done once every 3 years after age 36 if you are at a normal weight and without risk factors for diabetes. Testing should be considered at a younger age or be carried out more frequently if you are overweight and have at least 1 risk factor for diabetes.  Colorectal cancer can be detected and often prevented. Most routine colorectal cancer screening begins at the age of 37 and continues through age 107. However, your health care provider may recommend screening at an earlier age if you have risk factors for colon cancer. On a yearly basis, your health care provider may provide home test kits to check for hidden blood in the stool. A small camera at the end of a  tube may be used to directly examine the colon (sigmoidoscopy or colonoscopy) to detect the earliest forms of colorectal cancer. Talk to your health care provider about this at age 38 when routine screening begins. A direct exam of the colon should be repeated every 5-10 years through age 92, unless early forms of precancerous polyps or small growths are found.  People who are at an increased risk for hepatitis B should be screened for this virus. You are considered at high risk for hepatitis B if:  You were born in a country where hepatitis B occurs often. Talk with your health care provider about which countries are considered high risk.  Your parents were born in a high-risk country and you have not received a shot to protect against hepatitis B (hepatitis B vaccine).  You have HIV or  AIDS.  You use needles to inject street drugs.  You live with, or have sex with, someone who has hepatitis B.  You are a man who has sex with other men (MSM).  You get hemodialysis treatment.  You take certain medicines for conditions like cancer, organ transplantation, and autoimmune conditions.  Hepatitis C blood testing is recommended for all people born from [birth year] through 1967 and any individual with known risk factors for hepatitis C.  Healthy men should no longer receive prostate-specific antigen (PSA) blood tests as part of routine cancer screening. Talk to your health care provider about prostate cancer screening.  Testicular cancer screening is not recommended for adolescents or adult males who have no symptoms. Screening includes self-exam, a health care provider exam, and other screening tests. Consult with your health care provider about any symptoms you have or any concerns you have about testicular cancer.  Practice safe sex. Use condoms and avoid high-risk sexual practices to reduce the spread of sexually transmitted infections (STIs).  You should be screened for STIs, including gonorrhea and chlamydia if:  You are sexually active and are younger than 24 years.  You are older than 24 years, and your health care provider tells you that you are at risk for this type of infection.  Your sexual activity has changed since you were last screened, and you are at an increased risk for chlamydia or gonorrhea. Ask your health care provider if you are at risk.  If you are at risk of being infected with HIV, it is recommended that you take a prescription medicine daily to prevent HIV infection. This is called pre-exposure prophylaxis (PrEP). You are considered at risk if:  You are a man who has sex with other men (MSM).  You are a heterosexual man who is sexually active with multiple partners.  You take drugs by injection.  You are sexually active with a partner who has  HIV.  Talk with your health care provider about whether you are at high risk of being infected with HIV. If you choose to begin PrEP, you should first be tested for HIV. You should then be tested every 3 months for as long as you are taking PrEP.  Use sunscreen. Apply sunscreen liberally and repeatedly throughout the day. You should seek shade when your shadow is shorter than you. Protect yourself by wearing long sleeves, pants, a wide-brimmed hat, and sunglasses year round whenever you are outdoors.  Tell your health care provider of new moles or changes in moles, especially if there is a change in shape or color. Also, tell your health care provider if a mole is larger than the size  of a pencil eraser.  A one-time screening for abdominal aortic aneurysm (AAA) and surgical repair of large AAAs by ultrasound is recommended for men aged 35-75 years who are current or former smokers.  Stay current with your vaccines (immunizations).   This information is not intended to replace advice given to you by your health care provider. Make sure you discuss any questions you have with your health care provider.   Document Released: 09/21/2007 Document Revised: 04/15/2014 Document Reviewed: 08/20/2010 Elsevier Interactive Patient Education [employer].

## 2015-11-29 NOTE — Progress Notes (Addendum)
Subjective:   Jesse Woods is a 65 y.o. male who presents for Medicare Annual/Subsequent preventive examination.  HRA assessment completed during this visit with Jesse Woods  The Patient was informed that the wellness visit is to identify future health risk and educate and initiate measures that can reduce risk for increased disease through the lifespan.    NO ROS; Medicare Wellness Visit  C/o of pain in left side; started x 1 weeks ago; hurts when he lies down Pain has stayed the same; does sleep through it, no sob, no n,v; no sweating; 1-10 about a 2; taken ibuprofen and it seemed to help;  Will fup with doctor if it worsens;    To note:  The patient needs to schedule a CPE for med refill and eval with Dr. Jenny Reichmann.  Called the patient this am to explain that he will need to schedule with Dr. Jenny Reichmann for refills. Stated that he has not seen Dr. Jenny Reichmann in awhile and voiced understanding that AWV is not a physical exam and he will need to schedule apt with dr. Jenny Reichmann.    Psychosocial: (mother had pancreatic cancer; Father DM; HTN; prostate cancer; Brother had cancer  Mother survived pancreatic cancer; 34  PMH; subarachnoid hemorrhage 65 yo secondary to HTN;  Hx DES 12/2014 DM2- Recently had heart doctor fill metformin; last A1c was 11/2014 (5.9 from 10.9 x 65 yo) dx in 54; started exercising; mowing;  Hyperlipidemia- 01/2015 (Trig 92; HDL 28)  Tobacco; never smoked ETOH; none since 1998  Medications reviewed for issues; compliance; otc meds Missing 3rd dose of hydralyzine; given pill box to fill at hs so he can take in the evening    BMI:  30   Diet:  Oatmeal in the am at [facility];  Eat at [employer] for evening; eats vegetables; rice, green beans; pinto;  Fruit in season  Drink water and tea; tea  Half and half Sweets; does not eat daily  Candy; eats root beer hard candy  Dental work; Pharmacist, community apt tomorrow   Exercise;   Walking one mile per day x 2  Takes about 60 minutes    Started mowing his grass;  Play basketball x 1;  Can plan 2 days a week of basketball for more exercise   Terminous term plan reviewed  Lives in one level home home Has son and grand son; Is not married; Divorced x 2  Home: level; barriers; or needs identified as bathroom railing or other review;  Fall hx; no   Personal safety issues reviewed:  1.  for risk such as safe community// yes 2.  smoke detector-yes 3.  firearms safety if applicable  4. protection when in the sun; no; did recommend  5. driving safety for seniors or any recent accidents.    Risk for Depression reviewed: Any emotional problems? Anxious, depressed, irritable, sad or blue?  No Denies feeling depressed or hopeless; voices pleasure in daily life How many social activities have you been engaged in within the last 2 weeks?no   Cognitive; Manages checkbook, medications; no failures of task Ad8 score reviewed for issues;  Issues making decisions; no  Less interest in hobbies / activities" no  Repeats questions, stories; family complaining: NO  Trouble using ordinary gadgets; microwave; computer: no  Forgets the month or year: no  Mismanaging finances: no  Missing apt: no but does write them down  Daily problems with thinking of memory NO Ad8 score is 0  MMSE not  appropriate unless AD8 score is > 2   Advanced Directive addressed; educated  Reviewed the AD form and gave him resources to complete.  Counseling Health Maintenance Gaps: Hep C / educated;  Zostavax; Educated; Has not taken;  Flu: [facility] [address] 08/22   Defer Foot exam/ A1c to CPE with Dr. Jenny Reichmann  Colonoscopy; completed 02/2011 and to be repeated 02/2016  EKG: 01/2015/ heart cath 2016  Prostate cancer screening: deferred   Hearing: 4000 hz   Ophthalmology exam 11/2015 / [facility] apt is Sept 6th Will see this month;  No issues with eyes   Immunizations Due: (Vaccines reviewed and educated regarding any  overdue)   Established and updated Risk reviewed and appropriate referral made or health recommendations:  Current Care Team reviewed and updated        Objective:    Vitals: BP 122/80   Pulse 90   Ht 5\' 8"  (1.727 m)   Wt 198 lb 12 oz (90.2 kg)   SpO2 97%   BMI 30.22 kg/m   Body mass index is 30.22 kg/m.  Tobacco History  Smoking Status  . Never Smoker  Smokeless Tobacco  . Never Used     Counseling given: Yes   Past Medical History:  Diagnosis Date  . Arthritis    "back and left hip" (12/22/2014)  . Coronary artery disease   . DCM (dilated cardiomyopathy) ([place]) 06/22/2014   EF 15%  . Diabetes mellitus, type II ([place])   . Hyperlipidemia   . Hypertension   . Subarachnoid hemorrhage ([place]) 1998   from HTN   Past Surgical History:  Procedure Laterality Date  . BRAIN SURGERY  1998   right frontal ventriculotomy with subsequent ventriculo-abfdominal shunt due to [facility]  . CARDIAC CATHETERIZATION N/A 12/22/2014   Procedure: Coronary Stent Intervention;  Surgeon: Jettie Booze, MD;  Location: [place] [facility];  Service: Cardiovascular;  Laterality: N/A;  . CARDIAC CATHETERIZATION  12/22/2014   Procedure: Left Heart Cath and Coronary Angiography;  Surgeon: Jettie Booze, MD;  Location: [place] [facility];  Service: Cardiovascular;;  . CARDIAC CATHETERIZATION  06/24/2014  . CORONARY ANGIOPLASTY    . CSF SHUNT  1998  . INGUINAL HERNIA REPAIR Right 1954  . LEFT HEART CATHETERIZATION WITH CORONARY ANGIOGRAM N/A 06/24/2014   Procedure: LEFT HEART CATHETERIZATION WITH CORONARY ANGIOGRAM;  Surgeon: Jettie Booze, MD;  Location: [facility] CATH LAB;  Service: Cardiovascular;  Laterality: N/A;   Family History  Problem Relation Age of Onset  . Cancer Mother     pancreatic  . Pancreatic cancer Mother   . Diabetes Father   . Hypertension Father   . Cancer Father     prostate cancer  . Prostate cancer Father   . Cancer Brother     prostate cancer - cause of death  .  Cancer Cousin     prostate cancer  . Prostate cancer Paternal Uncle    History  Sexual Activity  . Sexual activity: Not Currently  . Partners: Female    Outpatient Encounter Prescriptions as of 11/29/2015  Medication Sig  . aspirin 81 MG tablet Take 81 mg by mouth daily.    . carvedilol (COREG) 3.125 MG tablet take 1 tablet by mouth twice a day  . clopidogrel (PLAVIX) 75 MG tablet Take 1 tablet (75 mg total) by mouth daily.  Marland Kitchen ezetimibe (ZETIA) 10 MG tablet Take 1 tablet (10 mg total) by mouth daily.  . furosemide (LASIX) 40 MG  tablet Take 40 mg by mouth daily.  . hydrALAZINE (APRESOLINE) 25 MG tablet Take 1 tablet (25 mg total) by mouth 3 (three) times daily.  . isosorbide mononitrate (IMDUR) 30 MG 24 hr tablet Take 1 tablet (30 mg total) by mouth daily.  Marland Kitchen lisinopril (PRINIVIL,ZESTRIL) 20 MG tablet Take 1 tablet (20 mg total) by mouth 2 (two) times daily.  . metFORMIN (GLUCOPHAGE) 1000 MG tablet Take 1 tablet (1,000 mg total) by mouth 2 (two) times daily with a meal.  . Multiple Vitamins-Minerals (MULTIVITAMIN WITH MINERALS) tablet Take 1 tablet by mouth daily.    . nitroGLYCERIN (NITROSTAT) 0.4 MG SL tablet Place 0.4 mg under the tongue every 5 (five) minutes as needed for chest pain (x 3 tabs).  . rosuvastatin (CRESTOR) 40 MG tablet Take 1 tablet (40 mg total) by mouth daily.  Marland Kitchen spironolactone (ALDACTONE) 25 MG tablet Take 0.5 tablets (12.5 mg total) by mouth daily.  Marland Kitchen tetrahydrozoline-zinc (VISINE-AC) 0.05-0.25 % ophthalmic solution Place 1 drop into both eyes daily as needed (allergy irritation.).   No facility-administered encounter medications on file as of 11/29/2015.     Activities of Daily Living In your present state of health, do you have any difficulty performing the following activities: 11/29/2015 12/22/2014  Hearing? N -  Vision? - -  Difficulty concentrating or making decisions? - Y  Walking or climbing stairs? - -  Dressing or bathing? - -  Doing errands, shopping?  - -  Some recent data might be hidden    Patient Care Team: Biagio Borg, MD as PCP - General (Internal Medicine) Clent Jacks, MD (Ophthalmology)   Assessment:   Exercise Activities and Dietary recommendations    Goals    . patient           Stay single; play basketball x 2 days a week        Fall Risk Fall Risk  11/29/2015  Falls in the past year? No   Depression Screen PHQ 2/9 Scores 11/29/2015  PHQ - 2 Score 0    Cognitive Testing No flowsheet data found.  Ad8 score is 0; no issues   Immunization History  Administered Date(s) Administered  . Influenza Split 12/02/2012  . Influenza Whole 01/12/2007, 01/06/2012  . Influenza, Seasonal, Injecte, Preservative Fre 11/22/2013  . Influenza-Unspecified 11/28/2015  . Pneumococcal Polysaccharide-23 04/28/2012, 12/23/2014  . Td 11/17/2009   Screening Tests Health Maintenance  Topic Date Due  . Hepatitis C Screening  08/04/1950  . ZOSTAVAX  12/03/2008  . FOOT EXAM  04/28/2013  . HEMOGLOBIN A1C  05/19/2015  . INFLUENZA VACCINE  11/07/2015  . OPHTHALMOLOGY EXAM  11/30/2015  . PNA vac Low Risk Adult (2 of 2 - PCV13) 12/23/2015  . COLONOSCOPY  02/07/2016  . TETANUS/TDAP  11/18/2019      Plan:     To schedule to see Dr. Jenny Reichmann for CPE in Sept for DM and HTN fup and refill of meds.   Had flu shot yesterday at [place]  Will be attentive for colonoscopy due in November  Eye exam Sept 6th;    During the course of the visit the patient was educated and counseled about the following appropriate screening and preventive services:   Vaccines to include Pneumoccal, Influenza, Hepatitis B, Td, Zostavax, HCV  Electrocardiogram  Cardiovascular Disease  Colorectal cancer screening  Diabetes screening  Prostate Cancer Screening  Glaucoma screening  Nutrition counseling   Smoking cessation counseling  Patient Instructions (the written plan) was given to the  patient.    Wynetta Fines, RN  11/29/2015  Medical  screening examination/treatment/procedure(s) were performed by non-physician practitioner and as supervising physician I was immediately available for consultation/collaboration. I agree with above. Cathlean Cower, MD

## 2015-12-01 ENCOUNTER — Encounter: Payer: Self-pay | Admitting: Physician Assistant

## 2015-12-13 DIAGNOSIS — H2513 Age-related nuclear cataract, bilateral: Secondary | ICD-10-CM | POA: Diagnosis not present

## 2015-12-13 DIAGNOSIS — H25013 Cortical age-related cataract, bilateral: Secondary | ICD-10-CM | POA: Diagnosis not present

## 2015-12-13 DIAGNOSIS — E119 Type 2 diabetes mellitus without complications: Secondary | ICD-10-CM | POA: Diagnosis not present

## 2015-12-13 DIAGNOSIS — H401131 Primary open-angle glaucoma, bilateral, mild stage: Secondary | ICD-10-CM | POA: Diagnosis not present

## 2015-12-17 NOTE — Progress Notes (Signed)
Cardiology Office Note:    Date:  12/18/2015   ID:  Jesse Woods, DOB [birth year], MRN [id number]  PCP:  Jesse Cower, MD  Cardiologist:  Dr. Fransico Woods   Electrophysiologist:  n/a  Referring MD: Jesse Borg, MD   Chief Complaint  Patient presents with  . Follow-up    CAD    History of Present Illness:    Jesse Woods is a 65 y.o. male with a hx of HTN, HL, DM2, remote history of subarachnoid hemorrhage, CAD and ischemic cardiomyopathy. He was noted to have chronic total occlusion of mid LCx and severely diseased RCA on previous cath in February 2016. EF at that time was 15%.  A FU echo in August 2016 showed EF had improved to 35-40%.  He was cleared by neurosurgery for DAPT and then was seen by Dr. Irish Woods to proceed with PCI of RCA.  LHC 12/22/2014 showed 90% proximal to mid RCA lesion that treated with 3.0 x 32 mm Synergy DES.  Chronic total occlusion of mid left circumflex with left to left collaterals. Normal LVEDP 5 mmHg.  He has not had a FU Echo since his PCI.  Last seen by Jesse Fuller, PA-C in 1/17.    He returns for follow-up. Overall, he has been doing well. He does have some left flank pain that is worse with positional changes. He denies any recurrent chest discomfort. He denies significant dyspnea. He denies orthopnea, PND or edema. He denies syncope. Denies any bleeding issues.  Prior CV studies that were reviewed today include:    LHC 12/22/14 LAD: The 25% LCx: Mid to distal 100%-CTO with left to left collaterals RCA: Proximal to mid 90% >>PCI: 3 x 32 mm Synergy DES  Echo 11/16/14 Mild focal basal hypertrophy of the septum, EF 35-40%, inferior AK, Gr 1 DD, trivial AI, Ao root mildly dilated (38 mm), MAC, mild LAE  Past Medical History:  Diagnosis Date  . Arthritis    "back and left hip" (12/22/2014)  . Coronary artery disease   . DCM (dilated cardiomyopathy) ([place]) 06/22/2014   EF 15%  . Diabetes mellitus, type II ([place])   . Hyperlipidemia   . Hypertension    . Subarachnoid hemorrhage ([place]) 1998   from HTN    Past Surgical History:  Procedure Laterality Date  . BRAIN SURGERY  1998   right frontal ventriculotomy with subsequent ventriculo-abfdominal shunt due to [facility]  . CARDIAC CATHETERIZATION N/A 12/22/2014   Procedure: Coronary Stent Intervention;  Surgeon: Jesse Booze, MD;  Location: [facility];  Service: Cardiovascular;  Laterality: N/A;  . CARDIAC CATHETERIZATION  12/22/2014   Procedure: Left Heart Cath and Coronary Angiography;  Surgeon: Jesse Booze, MD;  Location: [place] [facility];  Service: Cardiovascular;;  . CARDIAC CATHETERIZATION  06/24/2014  . CORONARY ANGIOPLASTY    . CSF SHUNT  1998  . INGUINAL HERNIA REPAIR Right 1954  . LEFT HEART CATHETERIZATION WITH CORONARY ANGIOGRAM N/A 06/24/2014   Procedure: LEFT HEART CATHETERIZATION WITH CORONARY ANGIOGRAM;  Surgeon: Jesse Booze, MD;  [facility] CATH LAB;  Service: Cardiovascular;  Laterality: N/A;    Current Medications: Outpatient Medications Prior to Visit  Medication Sig Dispense Refill  . aspirin 81 MG tablet Take 81 mg by mouth daily.      . carvedilol (COREG) 3.125 MG tablet take 1 tablet by mouth twice a day 60 tablet 11  . clopidogrel (PLAVIX) 75 MG tablet Take 1 tablet (75 mg total) by  mouth daily. 30 tablet 11  . ezetimibe (ZETIA) 10 MG tablet Take 1 tablet (10 mg total) by mouth daily. 30 tablet 11  . furosemide (LASIX) 40 MG tablet Take 40 mg by mouth daily.  0  . hydrALAZINE (APRESOLINE) 25 MG tablet Take 1 tablet (25 mg total) by mouth 3 (three) times daily. 90 tablet 11  . isosorbide mononitrate (IMDUR) 30 MG 24 hr tablet Take 1 tablet (30 mg total) by mouth daily. 30 tablet 11  . lisinopril (PRINIVIL,ZESTRIL) 20 MG tablet Take 1 tablet (20 mg total) by mouth 2 (two) times daily. 60 tablet 0  . metFORMIN (GLUCOPHAGE) 1000 MG tablet Take 1 tablet (1,000 mg total) by mouth 2 (two) times daily with a meal. 180 tablet 1  . Multiple  Vitamins-Minerals (MULTIVITAMIN WITH MINERALS) tablet Take 1 tablet by mouth daily.      . nitroGLYCERIN (NITROSTAT) 0.4 MG SL tablet Place 0.4 mg under the tongue every 5 (five) minutes as needed for chest pain (x 3 tabs).    . rosuvastatin (CRESTOR) 40 MG tablet Take 1 tablet (40 mg total) by mouth daily. 30 tablet 11  . spironolactone (ALDACTONE) 25 MG tablet Take 0.5 tablets (12.5 mg total) by mouth daily. 30 tablet 3  . tetrahydrozoline-zinc (VISINE-AC) 0.05-0.25 % ophthalmic solution Place 1 drop into both eyes daily as needed (allergy irritation.).     No facility-administered medications prior to visit.       Allergies:   Review of patient's allergies indicates no known allergies.   Social History   Social History  . Marital status: Divorced    Spouse name: N/A  . Number of children: N/A  . Years of education: N/A   Social History Main Topics  . Smoking status: Never Smoker  . Smokeless tobacco: Never Used  . Alcohol use Yes     Comment: 12/22/2014 "no alcohol since 1998"  . Drug use: No  . Sexual activity: Not Currently    Partners: Female   Other Topics Concern  . None   Social History Narrative   HSG, Graduated [employer]. Married- '72-'86; remarried '86-'90, single. 1 son- '73, 1 daughter-'79; 2 grandchildren. Retired after [employer]	[place], worked for [employer] before retirement. Had served in [employer] force. He did live in [place]. Lives together with his son. Very active in his church     Family History:  The patient's family history includes Cancer in his brother, cousin, father, and mother; Diabetes in his father; Hypertension in his father; Pancreatic cancer in his mother; Prostate cancer in his father and paternal uncle.   ROS:   Please see the history of present illness.    ROS All other systems reviewed and are negative.   EKGs/Labs/Other Test Reviewed:    EKG:  EKG is  ordered today.  The ekg ordered today demonstrates  NSR, HR 69, normal axis, T-wave inversions 1, aVL, V5-V6, QTc 437 ms, no change since prior tracing dated 02/02/15  Recent Labs: 01/13/2015: BUN 21; Creat 1.23; Hemoglobin 12.5; Platelets 205; Potassium 4.4; Sodium 140 02/02/2015: ALT 24   Recent Lipid Panel    Component Value Date/Time   CHOL 92 (L) 02/02/2015 1145   TRIG 82 02/02/2015 1145   HDL 28 (L) 02/02/2015 1145   CHOLHDL 3.3 02/02/2015 1145   VLDL 16 02/02/2015 1145   LDLCALC 48 02/02/2015 1145   LDLDIRECT 215.3 01/03/2011 1035     Physical Exam:    VS:  BP 136/88   Pulse  69   Ht 5\' 8"  (1.727 m)   Wt 200 lb (90.7 kg)   BMI 30.41 kg/m     Wt Readings from Last 3 Encounters:  12/18/15 200 lb (90.7 kg)  11/29/15 198 lb 12 oz (90.2 kg)  04/28/15 202 lb 12.8 oz (92 kg)     Physical Exam  Constitutional: He is oriented to person, place, and time. He appears well-developed and well-nourished. No distress.  HENT:  Head: Normocephalic and atraumatic.  Eyes: No scleral icterus.  Neck: Normal range of motion. No JVD present. Carotid bruit is not present.  Cardiovascular: Normal rate, regular rhythm, S1 normal and S2 normal.   No murmur heard. Pulmonary/Chest: Effort normal and breath sounds normal. He has no wheezes. He has no rhonchi. He has no rales.  Abdominal: Soft. There is no tenderness.  Musculoskeletal: He exhibits no edema.  Neurological: He is alert and oriented to person, place, and time.  Skin: Skin is warm and dry.  Psychiatric: He has a normal mood and affect.    ASSESSMENT:    1. Coronary artery disease involving native coronary artery of native heart without angina pectoris   2. Chronic systolic CHF (congestive heart failure) ([place])   3. Ischemic cardiomyopathy   4. Essential hypertension   5. Dyslipidemia   6. Left flank pain    PLAN:    In order of problems listed above:  1. CAD:  S/p recent DES to the RCA in 9/16. He has a known CTO of the LCx with left to left collaterals.   He is doing  well without angina.  Continue ASA, Plavix, beta-blocker, nitrates.  2. Chronic Systolic CHF - Volume stable.  NYHA 2.  Continue beta-blocker, ACE inhibitor, spironolactone, nitrates, hydralazine.  Arrange FU BMET.   3. Ischemic Cardiomyopathy:  Mixed ischemic/non-ischemic CM.  Continue current Rx.  He never underwent FU echo. Will arrange.  If EF < 35%, refer to EP for poss ICD.  4. HTN:  Controlled.  Continue current Rx.    5. Hyperlipidemia:   Continue statin, Zetia.  LDL in 10/16 was optima.  Will arrange FU Lipids and LFTs next month.   6. Flank pain - Sounds MSK.  Advised heat, tylenol and rare use of NSAIDs. FU with PCP if no improvement.    Medication Adjustments/Labs and Tests Ordered: Current medicines are reviewed at length with the patient today.  Concerns regarding medicines are outlined above.  Medication changes, Labs and Tests ordered today are outlined in the Patient Instructions noted below. Patient Instructions  Medication Instructions:  Your physician recommends that you continue on your current medications as directed. Please refer to the Current Medication list given to you today.  Labwork:  02/05/16 FOR FASTING CHOLESTEROL PANEL AND BMET  Testing/Procedures: 1. Your physician has requested that you have an echocardiogram. Echocardiography is a painless test that uses sound waves to create images of your heart. It provides your doctor with information about the size and shape of your heart and how well your heart's chambers and valves are working. This procedure takes approximately one hour. There are no restrictions for this procedure.  Follow-Up: 1. Your physician wants you to follow-up in: [place].  You will receive a reminder letter in the mail two months in advance. If you don't receive a letter, please call our office to schedule the follow-up appointment. Any Other Special Instructions Will Be Listed Below (If Applicable). If you need a refill  on your cardiac  medications before your next appointment, please call your pharmacy.  Signed, Richardson Dopp, PA-C  12/18/2015 9:28 AM    [facility] [address] [place], [place], [place]  [address] Phone: [phone]; Fax: [phone]

## 2015-12-18 ENCOUNTER — Ambulatory Visit (INDEPENDENT_AMBULATORY_CARE_PROVIDER_SITE_OTHER): Payer: Commercial Managed Care - HMO | Admitting: Physician Assistant

## 2015-12-18 ENCOUNTER — Encounter: Payer: Self-pay | Admitting: Physician Assistant

## 2015-12-18 ENCOUNTER — Encounter (INDEPENDENT_AMBULATORY_CARE_PROVIDER_SITE_OTHER): Payer: Self-pay

## 2015-12-18 ENCOUNTER — Other Ambulatory Visit: Payer: Self-pay | Admitting: Interventional Cardiology

## 2015-12-18 ENCOUNTER — Other Ambulatory Visit: Payer: Self-pay | Admitting: Physician Assistant

## 2015-12-18 ENCOUNTER — Other Ambulatory Visit: Payer: Self-pay | Admitting: Cardiology

## 2015-12-18 VITALS — BP 136/88 | HR 69 | Ht 68.0 in | Wt 200.0 lb

## 2015-12-18 DIAGNOSIS — I2510 Atherosclerotic heart disease of native coronary artery without angina pectoris: Secondary | ICD-10-CM | POA: Diagnosis not present

## 2015-12-18 DIAGNOSIS — I10 Essential (primary) hypertension: Secondary | ICD-10-CM

## 2015-12-18 DIAGNOSIS — I255 Ischemic cardiomyopathy: Secondary | ICD-10-CM

## 2015-12-18 DIAGNOSIS — R109 Unspecified abdominal pain: Secondary | ICD-10-CM

## 2015-12-18 DIAGNOSIS — I5022 Chronic systolic (congestive) heart failure: Secondary | ICD-10-CM

## 2015-12-18 DIAGNOSIS — E785 Hyperlipidemia, unspecified: Secondary | ICD-10-CM

## 2015-12-18 NOTE — Patient Instructions (Addendum)
Medication Instructions:  Your physician recommends that you continue on your current medications as directed. Please refer to the Current Medication list given to you today.  Labwork:  02/05/16 FOR FASTING CHOLESTEROL PANEL AND BMET  Testing/Procedures: 1. Your physician has requested that you have an echocardiogram. Echocardiography is a painless test that uses sound waves to create images of your heart. It provides your doctor with information about the size and shape of your heart and how well your heart's chambers and valves are working. This procedure takes approximately one hour. There are no restrictions for this procedure.  Follow-Up: 1. Your physician wants you to follow-up in: [place].  You will receive a reminder letter in the mail two months in advance. If you don't receive a letter, please call our office to schedule the follow-up appointment. Any Other Special Instructions Will Be Listed Below (If Applicable). If you need a refill on your cardiac medications before your next appointment, please call your pharmacy.

## 2016-01-17 ENCOUNTER — Other Ambulatory Visit: Payer: Self-pay | Admitting: *Deleted

## 2016-01-17 MED ORDER — SPIRONOLACTONE 25 MG PO TABS: 12.5000 mg | ORAL_TABLET | Freq: Every day | ORAL | 11 refills | Status: DC

## 2016-01-18 ENCOUNTER — Other Ambulatory Visit: Payer: Self-pay | Admitting: *Deleted

## 2016-01-18 MED ORDER — ISOSORBIDE MONONITRATE ER 30 MG PO TB24: 30.0000 mg | ORAL_TABLET | Freq: Every day | ORAL | 10 refills | Status: DC

## 2016-01-26 ENCOUNTER — Encounter: Payer: Self-pay | Admitting: Gastroenterology

## 2016-02-05 ENCOUNTER — Encounter: Payer: Self-pay | Admitting: Physician Assistant

## 2016-02-05 ENCOUNTER — Other Ambulatory Visit: Payer: Commercial Managed Care - HMO | Admitting: *Deleted

## 2016-02-05 ENCOUNTER — Other Ambulatory Visit: Payer: Self-pay

## 2016-02-05 ENCOUNTER — Telehealth: Payer: Self-pay | Admitting: *Deleted

## 2016-02-05 ENCOUNTER — Ambulatory Visit (HOSPITAL_COMMUNITY): Payer: Commercial Managed Care - HMO | Attending: Cardiology

## 2016-02-05 DIAGNOSIS — I110 Hypertensive heart disease with heart failure: Secondary | ICD-10-CM | POA: Insufficient documentation

## 2016-02-05 DIAGNOSIS — I5022 Chronic systolic (congestive) heart failure: Secondary | ICD-10-CM

## 2016-02-05 DIAGNOSIS — E785 Hyperlipidemia, unspecified: Secondary | ICD-10-CM

## 2016-02-05 DIAGNOSIS — I509 Heart failure, unspecified: Secondary | ICD-10-CM | POA: Diagnosis not present

## 2016-02-05 DIAGNOSIS — I2510 Atherosclerotic heart disease of native coronary artery without angina pectoris: Secondary | ICD-10-CM | POA: Diagnosis not present

## 2016-02-05 DIAGNOSIS — I255 Ischemic cardiomyopathy: Secondary | ICD-10-CM | POA: Diagnosis not present

## 2016-02-05 DIAGNOSIS — E119 Type 2 diabetes mellitus without complications: Secondary | ICD-10-CM | POA: Insufficient documentation

## 2016-02-05 DIAGNOSIS — I10 Essential (primary) hypertension: Secondary | ICD-10-CM | POA: Diagnosis not present

## 2016-02-05 LAB — BASIC METABOLIC PANEL
BUN: 15 mg/dL (ref 7–25)
CHLORIDE: 102 mmol/L (ref 98–110)
CO2: 28 mmol/L (ref 20–31)
Calcium: 9.9 mg/dL (ref 8.6–10.3)
Creat: 1.35 mg/dL — ABNORMAL HIGH (ref 0.70–1.25)
GLUCOSE: 126 mg/dL — AB (ref 65–99)
POTASSIUM: 4.9 mmol/L (ref 3.5–5.3)
SODIUM: 139 mmol/L (ref 135–146)

## 2016-02-05 LAB — HEPATIC FUNCTION PANEL
ALT: 28 U/L (ref 9–46)
AST: 24 U/L (ref 10–35)
Albumin: 4.4 g/dL (ref 3.6–5.1)
Alkaline Phosphatase: 37 U/L — ABNORMAL LOW (ref 40–115)
BILIRUBIN DIRECT: 0.2 mg/dL (ref <=0.2)
BILIRUBIN INDIRECT: 0.4 mg/dL (ref 0.2–1.2)
BILIRUBIN TOTAL: 0.6 mg/dL (ref 0.2–1.2)
Total Protein: 7.5 g/dL (ref 6.1–8.1)

## 2016-02-05 LAB — LIPID PANEL
CHOL/HDL RATIO: 3.3 ratio (ref <=5.0)
CHOLESTEROL: 96 mg/dL — AB (ref 125–200)
HDL: 29 mg/dL — AB (ref >=40)
LDL Cholesterol: 45 mg/dL (ref <130)
TRIGLYCERIDES: 110 mg/dL (ref <150)
VLDL: 22 mg/dL (ref <30)

## 2016-02-05 MED ORDER — SACUBITRIL-VALSARTAN 49-51 MG PO TABS: 1.0000 | ORAL_TABLET | Freq: Two times a day (BID) | ORAL | 3 refills | Status: DC

## 2016-02-05 NOTE — Telephone Encounter (Signed)
Pt notified of both lab and echo results and findings. Pt is agreeable to medication change to stop Lisinopril and 36 hours later start Entresto 49/51 mg BID. BMET 02/20/16 due to med change. Appt scheduled for pt with Brynda Rim. PA 03/08/16 9:15 1 month f/u on Entresto.

## 2016-02-06 ENCOUNTER — Other Ambulatory Visit: Payer: Self-pay

## 2016-02-06 ENCOUNTER — Telehealth: Payer: Self-pay

## 2016-02-06 DIAGNOSIS — I5022 Chronic systolic (congestive) heart failure: Secondary | ICD-10-CM

## 2016-02-06 MED ORDER — SACUBITRIL-VALSARTAN 49-51 MG PO TABS: 1.0000 | ORAL_TABLET | Freq: Two times a day (BID) | ORAL | 11 refills | Status: DC

## 2016-02-06 NOTE — Telephone Encounter (Signed)
No samples of Entresto available at this time. I have provided him with a 30 day free coupon. Numbers for the [facility] and [employer] given. Working on a PA.

## 2016-02-06 NOTE — Telephone Encounter (Signed)
Prior auth for [employer] [id number] submitted to [facility].

## 2016-02-20 ENCOUNTER — Other Ambulatory Visit: Payer: Commercial Managed Care - HMO

## 2016-03-04 ENCOUNTER — Telehealth: Payer: Self-pay

## 2016-03-04 NOTE — Telephone Encounter (Signed)
Received a fax from [employer] re: PA for [employer]. I submitted one on 02/06/16 but never received an answer. Attempted another PA, but one was not needed, as it was already approved. Note sent to Pharmacy about this.

## 2016-03-07 ENCOUNTER — Telehealth: Payer: Self-pay | Admitting: Interventional Cardiology

## 2016-03-07 NOTE — Telephone Encounter (Signed)
Pt calling requesting a refill on Metformin 1000 mg tablet. Would you like to refill this medication? Dr. Irish Lack has refill in the past. Richardson Dopp, PA, saw pt last.  Please advise

## 2016-03-08 ENCOUNTER — Encounter: Payer: Self-pay | Admitting: Physician Assistant

## 2016-03-08 ENCOUNTER — Ambulatory Visit (INDEPENDENT_AMBULATORY_CARE_PROVIDER_SITE_OTHER): Payer: Commercial Managed Care - HMO | Admitting: Physician Assistant

## 2016-03-08 VITALS — BP 108/62 | HR 88 | Ht 68.0 in | Wt 198.8 lb

## 2016-03-08 DIAGNOSIS — I255 Ischemic cardiomyopathy: Secondary | ICD-10-CM

## 2016-03-08 DIAGNOSIS — I2510 Atherosclerotic heart disease of native coronary artery without angina pectoris: Secondary | ICD-10-CM

## 2016-03-08 DIAGNOSIS — I10 Essential (primary) hypertension: Secondary | ICD-10-CM | POA: Diagnosis not present

## 2016-03-08 DIAGNOSIS — I5022 Chronic systolic (congestive) heart failure: Secondary | ICD-10-CM | POA: Diagnosis not present

## 2016-03-08 DIAGNOSIS — E1159 Type 2 diabetes mellitus with other circulatory complications: Secondary | ICD-10-CM

## 2016-03-08 DIAGNOSIS — E785 Hyperlipidemia, unspecified: Secondary | ICD-10-CM

## 2016-03-08 LAB — BASIC METABOLIC PANEL
BUN: 24 mg/dL (ref 7–25)
CALCIUM: 9.5 mg/dL (ref 8.6–10.3)
CO2: 24 mmol/L (ref 20–31)
CREATININE: 1.27 mg/dL — AB (ref 0.70–1.25)
Chloride: 107 mmol/L (ref 98–110)
GLUCOSE: 143 mg/dL — AB (ref 65–99)
Potassium: 4.6 mmol/L (ref 3.5–5.3)
SODIUM: 141 mmol/L (ref 135–146)

## 2016-03-08 MED ORDER — CARVEDILOL 6.25 MG PO TABS: 6.2500 mg | ORAL_TABLET | Freq: Two times a day (BID) | ORAL | 3 refills | Status: DC

## 2016-03-08 MED ORDER — METFORMIN HCL 1000 MG PO TABS: 1000.0000 mg | ORAL_TABLET | Freq: Two times a day (BID) | ORAL | 0 refills | Status: DC

## 2016-03-08 MED ORDER — LISINOPRIL 20 MG PO TABS: ORAL_TABLET | ORAL | 3 refills | Status: DC

## 2016-03-08 NOTE — Telephone Encounter (Signed)
Called pt to inform him per Arbie Cookey, CMA, that pt would have to refill his Metformin with PCP and if he has any other problems, questions or concerns to call the office. Pt verbalized understanding.

## 2016-03-08 NOTE — Telephone Encounter (Signed)
Metformin will need to be filled by PCP Looks like it may be DR. Cathlean Cower with [facility]. Dr. Irish Lack may have filled in the past to help pt out, but this should be filled with his PCP or whoever takes care of his diabetes.

## 2016-03-08 NOTE — Progress Notes (Signed)
Cardiology Office Note:    Date:  03/08/2016   ID:  Jesse Woods, DOB 1950/09/27, MRN [id number]  PCP:  Cathlean Cower, MD  Cardiologist:  Dr. Fransico Him   Electrophysiologist:  n/a  Referring MD: Biagio Borg, MD   Chief Complaint  Patient presents with  . Follow-up    CHF, CAD    History of Present Illness:    Jesse Woods is a 65 y.o. male with a hx of HTN, HL, DM2, remote history of subarachnoid hemorrhage, CAD and ischemic cardiomyopathy. He was noted to have chronic total occlusion of mid LCx and severely diseased RCA on previous cath in February 2016. EF at that time was 15%.  A FU echo in August 2016 showed EF had improved to 35-40%. He was cleared by neurosurgery for DAPT and then was seen by Dr. Irish Lack to proceed with PCI of RCA.  LHC 12/22/2014 showed 90% proximal to mid RCA lesion that treated with 3.0 x 32 mm Synergy DES. Chronic total occlusion of mid left circumflex with left to left collaterals. Normal LVEDP 5 mmHg.  Last seen by me in 9/17.  A follow up echocardiogram was arranged and demonstrated an EF of 35-40%.  I changed his ACE inhibitor to [employer].  He returns for Cardiology follow up.    He is here alone. Unfortunately, his insurance did not cover Entresto and it was too expensive. He is back on Lisinopril 20 bid.  He is doing well.  He denies chest pain, shortness of breath, orthopnea, PND, edema, syncope.  He denies any bleeding issues.  He is requesting a refill on his Metformin.  He has not seen primary care in about 2 years.    Prior CV studies that were reviewed today include:    Echo 02/05/16 EF 35-40, inferior, inf-lateral and inf-septal AK, Gr 1 DD, mild LAE  LHC 12/22/14 LAD: The 25% LCx: Mid to distal 100%-CTO with left to left collaterals RCA: Proximal to mid 90% >>PCI: 3 x 32 mm Synergy DES  Echo 11/16/14 Mild focal basal hypertrophy of the septum, EF 35-40%, inferior AK, Gr 1 DD, trivial AI, Ao root mildly dilated (38 mm), MAC, mild  LAE  Past Medical History:  Diagnosis Date  . Arthritis    "back and left hip" (12/22/2014)  . Coronary artery disease   . DCM (dilated cardiomyopathy) ([place]) 06/22/2014   EF 15%  . Diabetes mellitus, type II ([place])   . History of echocardiogram    Echo 10/17: EF 35-40, inf, inf-lateral and inf-septal AK, Gr 1 DD, mild LAE  . Hyperlipidemia   . Hypertension   . Subarachnoid hemorrhage ([place]) 1998   from HTN    Past Surgical History:  Procedure Laterality Date  . BRAIN SURGERY  1998   right frontal ventriculotomy with subsequent ventriculo-abfdominal shunt due to [facility]  . CARDIAC CATHETERIZATION N/A 12/22/2014   Procedure: Coronary Stent Intervention;  Surgeon: Jettie Booze, MD;  Location: [place] [facility];  Service: Cardiovascular;  Laterality: N/A;  . CARDIAC CATHETERIZATION  12/22/2014   Procedure: Left Heart Cath and Coronary Angiography;  Surgeon: Jettie Booze, MD;  Location: [place] [facility];  Service: Cardiovascular;;  . CARDIAC CATHETERIZATION  06/24/2014  . CORONARY ANGIOPLASTY    . CSF SHUNT  1998  . INGUINAL HERNIA REPAIR Right 1954  . LEFT HEART CATHETERIZATION WITH CORONARY ANGIOGRAM N/A 06/24/2014   Procedure: LEFT HEART CATHETERIZATION WITH CORONARY ANGIOGRAM;  Surgeon: Jettie Booze, MD;  Location: [place] CATH LAB;  Service: Cardiovascular;  Laterality: N/A;    Current Medications: Current Meds  Medication Sig  . aspirin 81 MG tablet Take 81 mg by mouth daily.    . clopidogrel (PLAVIX) 75 MG tablet take 1 tablet by mouth once daily  . furosemide (LASIX) 40 MG tablet Take 40 mg by mouth daily.  . hydrALAZINE (APRESOLINE) 25 MG tablet take 1 tablet by mouth three times a day  . isosorbide mononitrate (IMDUR) 30 MG 24 hr tablet Take 1 tablet (30 mg total) by mouth daily.  . Multiple Vitamins-Minerals (MULTIVITAMIN WITH MINERALS) tablet Take 1 tablet by mouth daily.    . nitroGLYCERIN (NITROSTAT) 0.4 MG SL tablet Place 0.4 mg under the tongue every 5  (five) minutes as needed for chest pain (x 3 tabs).  . rosuvastatin (CRESTOR) 40 MG tablet take 1 tablet by mouth once daily  . spironolactone (ALDACTONE) 25 MG tablet Take 0.5 tablets (12.5 mg total) by mouth daily.  Marland Kitchen tetrahydrozoline-zinc (VISINE-AC) 0.05-0.25 % ophthalmic solution Place 1 drop into both eyes daily as needed (allergy irritation.).  Marland Kitchen ZETIA 10 MG tablet take 1 tablet by mouth once daily  . [DISCONTINUED] carvedilol (COREG) 3.125 MG tablet take 1 tablet by mouth twice a day  . [DISCONTINUED] metFORMIN (GLUCOPHAGE) 1000 MG tablet Take 1 tablet (1,000 mg total) by mouth 2 (two) times daily with a meal.     Allergies:   Patient has no known allergies.   Social History   Social History  . Marital status: Divorced    Spouse name: N/A  . Number of children: N/A  . Years of education: N/A   Social History Main Topics  . Smoking status: Never Smoker  . Smokeless tobacco: Never Used  . Alcohol use Yes     Comment: 12/22/2014 "no alcohol since 1998"  . Drug use: No  . Sexual activity: Not Currently    Partners: Female   Other Topics Concern  . None   Social History Narrative   HSG, Graduated [employer]. Married- '72-'86; remarried '86-'90, single. 1 son- '73, 1 daughter-'79; 2 grandchildren. Retired after [employer]	[place], worked for [employer] before retirement. Had served in [employer] force. He did live in [place]. Lives together with his son. Very active in his church     Family History:  The patient's family history includes Cancer in his brother, cousin, father, and mother; Diabetes in his father; Hypertension in his father; Pancreatic cancer in his mother; Prostate cancer in his father and paternal uncle.   ROS:   Please see the history of present illness.    Review of Systems  Eyes: Positive for visual disturbance.   All other systems reviewed and are negative.   EKGs/Labs/Other Test Reviewed:    EKG:  EKG is  ordered today.   The ekg ordered today demonstrates NSR, HR 88, normal axis, TWI 1, aVL, QTc 459 ms  Recent Labs: 02/05/2016: ALT 28; BUN 15; Creat 1.35; Potassium 4.9; Sodium 139   Recent Lipid Panel    Component Value Date/Time   CHOL 96 (L) 02/05/2016 0810   TRIG 110 02/05/2016 0810   HDL 29 (L) 02/05/2016 0810   CHOLHDL 3.3 02/05/2016 0810   VLDL 22 02/05/2016 0810   LDLCALC 45 02/05/2016 0810   LDLDIRECT 215.3 01/03/2011 1035     Physical Exam:    VS:  BP 108/62   Pulse 88   Ht 5\' 8"  (1.727 m)   Wt 198 lb  12.8 oz (90.2 kg)   BMI 30.23 kg/m     Wt Readings from Last 3 Encounters:  03/08/16 198 lb 12.8 oz (90.2 kg)  12/18/15 200 lb (90.7 kg)  11/29/15 198 lb 12 oz (90.2 kg)     Physical Exam  Constitutional: He is oriented to person, place, and time. He appears well-developed and well-nourished. No distress.  HENT:  Head: Normocephalic and atraumatic.  Eyes: No scleral icterus.  Neck: No JVD present.  Cardiovascular: Normal rate, regular rhythm and normal heart sounds.   No murmur heard. Pulmonary/Chest: Effort normal. He has no wheezes. He has no rales.  Abdominal: Soft. There is no tenderness.  Musculoskeletal: He exhibits no edema.  Neurological: He is alert and oriented to person, place, and time.  Skin: Skin is warm and dry.  Psychiatric: He has a normal mood and affect.    ASSESSMENT:    1. Coronary artery disease involving native coronary artery of native heart without angina pectoris   2. Chronic systolic CHF (congestive heart failure) ([place])   3. Ischemic cardiomyopathy   4. Essential hypertension   5. Dyslipidemia   6. Type 2 diabetes mellitus with other circulatory complication, without long-term current use of insulin (HCC)    PLAN:    In order of problems listed above:  1. CAD: S/p DES to the RCA in 9/16. He has a known CTO of the LCx with left to left collaterals.  He is doing well without angina.    -  Continue ASA, Plavix, beta-blocker, nitrates.  -   I will review with Drs. [place] and [place] re: +/- continue Plavix  2. Chronic Systolic CHF - NYHA 2.  He could not afford Entresto.  He is back on Lisinopril.  His HR is > 70.  If he could not get Entresto, I doubt he would be able to get Ivabradine.  -  Continue beta-blocker, ACE inhibitor, spironolactone, nitrates, hydralazine.    -  Will decrease Lisinopril to 20 mg in A and 10 mg in P.  -  Increase Coreg to 6.25 mg bid   -  Repeat BMET today   3. Ischemic Cardiomyopathy: Mixed ischemic/non-ischemic CM. Last echo with EF 35-40.    4. HTN: BP is optimal.  But, I will adjust his Lisinopril to allow room in his BP to increase Coreg as outlined above.   5. Hyperlipidemia: LDL in 10/17 was 45.  Continue statin, Zetia.    6. Diabetes - He has not had follow up with primary care in ~ 2 years.  I have agreed to refill Metformin x 1.  But, he needs PCP follow up.  We have called [facility] primary care today so that they can arrange follow up.   Medication Adjustments/Labs and Tests Ordered: Current medicines are reviewed at length with the patient today.  Concerns regarding medicines are outlined above.  Medication changes, Labs and Tests ordered today are outlined in the Patient Instructions noted below. Patient Instructions  Medication Instructions:  1. DECREASE LISINOPRIL TO 20 MG IN THE MORNING AND 10 MG IN THE PM 2. INCREASE COREG TO 6.25 MG TWICE DAILY 3. A ONE TIME REFILL FOR METFORMIN HAS BEEN SENT IN; YOU WILL NEED FURTHER REFILLS TO BE DONE BY YOUR PRIMARY CARE  Labwork: TODAY BMET  Testing/Procedures: NONE  Follow-Up: DR. Radford Pax 05/09/16 @  9:45  Any Other Special Instructions Will Be Listed Below (If Applicable).  If you need a refill on your cardiac medications before your next  appointment, please call your pharmacy.  Signed, Richardson Dopp, PA-C  03/08/2016 10:03 AM    [facility] [place], [place], [place]  [address] Phone: [phone];  Fax: [phone]

## 2016-03-08 NOTE — Patient Instructions (Addendum)
Medication Instructions:  1. DECREASE LISINOPRIL TO 20 MG IN THE MORNING AND 10 MG IN THE PM 2. INCREASE COREG TO 6.25 MG TWICE DAILY 3. A ONE TIME REFILL FOR METFORMIN HAS BEEN SENT IN; YOU WILL NEED FURTHER REFILLS TO BE DONE BY YOUR PRIMARY CARE  Labwork: TODAY BMET  Testing/Procedures: NONE  Follow-Up: DR. Radford Pax 05/09/16 @  9:45  Any Other Special Instructions Will Be Listed Below (If Applicable).  If you need a refill on your cardiac medications before your next appointment, please call your pharmacy.

## 2016-03-11 ENCOUNTER — Telehealth: Payer: Self-pay | Admitting: *Deleted

## 2016-03-11 NOTE — Telephone Encounter (Signed)
Pt notified of lab results by phone with verbal understanding.  

## 2016-03-20 ENCOUNTER — Ambulatory Visit (INDEPENDENT_AMBULATORY_CARE_PROVIDER_SITE_OTHER): Payer: Commercial Managed Care - HMO | Admitting: Internal Medicine

## 2016-03-20 ENCOUNTER — Other Ambulatory Visit (INDEPENDENT_AMBULATORY_CARE_PROVIDER_SITE_OTHER): Payer: Commercial Managed Care - HMO

## 2016-03-20 ENCOUNTER — Encounter: Payer: Self-pay | Admitting: Internal Medicine

## 2016-03-20 VITALS — BP 132/70 | HR 100 | Resp 20 | Wt 199.0 lb

## 2016-03-20 DIAGNOSIS — Z1159 Encounter for screening for other viral diseases: Secondary | ICD-10-CM | POA: Diagnosis not present

## 2016-03-20 DIAGNOSIS — Z860100 Personal history of colon polyps, unspecified: Secondary | ICD-10-CM

## 2016-03-20 DIAGNOSIS — Z0000 Encounter for general adult medical examination without abnormal findings: Secondary | ICD-10-CM | POA: Diagnosis not present

## 2016-03-20 DIAGNOSIS — E1159 Type 2 diabetes mellitus with other circulatory complications: Secondary | ICD-10-CM

## 2016-03-20 LAB — CBC WITH DIFFERENTIAL/PLATELET
BASOS ABS: 0 10*3/uL (ref 0.0–0.1)
Basophils Relative: 0.2 % (ref 0.0–3.0)
EOS ABS: 0.2 10*3/uL (ref 0.0–0.7)
Eosinophils Relative: 1.6 % (ref 0.0–5.0)
HCT: 38.7 % — ABNORMAL LOW (ref 39.0–52.0)
HEMOGLOBIN: 12.8 g/dL — AB (ref 13.0–17.0)
LYMPHS PCT: 14 % (ref 12.0–46.0)
Lymphs Abs: 1.4 10*3/uL (ref 0.7–4.0)
MCHC: 33.1 g/dL (ref 30.0–36.0)
MCV: 89 fl (ref 78.0–100.0)
MONO ABS: 0.8 10*3/uL (ref 0.1–1.0)
Monocytes Relative: 8.2 % (ref 3.0–12.0)
Neutro Abs: 7.4 10*3/uL (ref 1.4–7.7)
Neutrophils Relative %: 76 % (ref 43.0–77.0)
Platelets: 266 10*3/uL (ref 150.0–400.0)
RBC: 4.35 Mil/uL (ref 4.22–5.81)
RDW: 12.7 % (ref 11.5–15.5)
WBC: 9.7 10*3/uL (ref 4.0–10.5)

## 2016-03-20 LAB — MICROALBUMIN / CREATININE URINE RATIO
CREATININE, U: 415.2 mg/dL
Microalb Creat Ratio: 1 mg/g (ref 0.0–30.0)
Microalb, Ur: 4.1 mg/dL — ABNORMAL HIGH (ref 0.0–1.9)

## 2016-03-20 LAB — BASIC METABOLIC PANEL
BUN: 23 mg/dL (ref 6–23)
CHLORIDE: 105 meq/L (ref 96–112)
CO2: 28 mEq/L (ref 19–32)
CREATININE: 1.39 mg/dL (ref 0.40–1.50)
Calcium: 9.7 mg/dL (ref 8.4–10.5)
GFR: 65.49 mL/min (ref >60.00)
GLUCOSE: 137 mg/dL — AB (ref 70–99)
POTASSIUM: 4.6 meq/L (ref 3.5–5.1)
Sodium: 141 mEq/L (ref 135–145)

## 2016-03-20 LAB — URINALYSIS, ROUTINE W REFLEX MICROSCOPIC
Bilirubin Urine: NEGATIVE
HGB URINE DIPSTICK: NEGATIVE
LEUKOCYTES UA: NEGATIVE
Nitrite: NEGATIVE
PH: 5.5 (ref 5.0–8.0)
Specific Gravity, Urine: >=1.03 — AB (ref 1.000–1.030)
TOTAL PROTEIN, URINE-UPE24: NEGATIVE
URINE GLUCOSE: NEGATIVE
UROBILINOGEN UA: 0.2 (ref 0.0–1.0)

## 2016-03-20 LAB — PSA: PSA: 1.28 ng/mL (ref 0.10–4.00)

## 2016-03-20 LAB — HEPATIC FUNCTION PANEL
ALBUMIN: 4.6 g/dL (ref 3.5–5.2)
ALT: 24 U/L (ref 0–53)
AST: 20 U/L (ref 0–37)
Alkaline Phosphatase: 38 U/L — ABNORMAL LOW (ref 39–117)
BILIRUBIN TOTAL: 0.5 mg/dL (ref 0.2–1.2)
Bilirubin, Direct: 0.1 mg/dL (ref 0.0–0.3)
Total Protein: 7.5 g/dL (ref 6.0–8.3)

## 2016-03-20 LAB — LIPID PANEL
CHOL/HDL RATIO: 4
CHOLESTEROL: 105 mg/dL (ref 0–200)
HDL: 29.1 mg/dL — AB (ref >39.00)
LDL Cholesterol: 48 mg/dL (ref 0–99)
NonHDL: 76.07
TRIGLYCERIDES: 141 mg/dL (ref 0.0–149.0)
VLDL: 28.2 mg/dL (ref 0.0–40.0)

## 2016-03-20 LAB — TSH: TSH: 1 u[IU]/mL (ref 0.35–4.50)

## 2016-03-20 LAB — HEMOGLOBIN A1C: Hgb A1c MFr Bld: 6.1 % (ref 4.6–6.5)

## 2016-03-20 NOTE — Progress Notes (Signed)
Subjective:    Patient ID: Jesse Woods, male    DOB: 1950/12/20, 65 y.o.   MRN: [id number]  HPI  Here for wellness and f/u;  Overall doing ok;  Pt denies Chest pain, worsening SOB, DOE, wheezing, orthopnea, PND, worsening LE edema, palpitations, dizziness or syncope.  Pt denies neurological change such as new headache, facial or extremity weakness.  Pt denies polydipsia, polyuria, or low sugar symptoms. Pt states overall good compliance with treatment and medications, good tolerability, and has been trying to follow appropriate diet.  Pt denies worsening depressive symptoms, suicidal ideation or panic. No fever, night sweats, wt loss, loss of appetite, or other constitutional symptoms.  Pt states good ability with ADL's, has low fall risk, home safety reviewed and adequate, no other significant changes in hearing or vision, and only occasionally active with exercise. Due for Hep C, routine lab and colonoscopy.  No other history change Past Medical History:  Diagnosis Date  . Arthritis    "back and left hip" (12/22/2014)  . Coronary artery disease   . DCM (dilated cardiomyopathy) ([place]) 06/22/2014   EF 15%  . Diabetes mellitus, type II ([place])   . History of echocardiogram    Echo 10/17: EF 35-40, inf, inf-lateral and inf-septal AK, Gr 1 DD, mild LAE  . Hyperlipidemia   . Hypertension   . Subarachnoid hemorrhage ([place]) 1998   from HTN   Past Surgical History:  Procedure Laterality Date  . BRAIN SURGERY  1998   right frontal ventriculotomy with subsequent ventriculo-abfdominal shunt due to [facility]  . CARDIAC CATHETERIZATION N/A 12/22/2014   Procedure: Coronary Stent Intervention;  Surgeon: Jettie Booze, MD;  Location: [place] [facility];  Service: Cardiovascular;  Laterality: N/A;  . CARDIAC CATHETERIZATION  12/22/2014   Procedure: Left Heart Cath and Coronary Angiography;  Surgeon: Jettie Booze, MD;  Location: [place] [facility];  Service: Cardiovascular;;  . CARDIAC  CATHETERIZATION  06/24/2014  . CORONARY ANGIOPLASTY    . CSF SHUNT  1998  . INGUINAL HERNIA REPAIR Right 1954  . LEFT HEART CATHETERIZATION WITH CORONARY ANGIOGRAM N/A 06/24/2014   Procedure: LEFT HEART CATHETERIZATION WITH CORONARY ANGIOGRAM;  Surgeon: Jettie Booze, MD;  Location: [facility] CATH LAB;  Service: Cardiovascular;  Laterality: N/A;    reports that he has never smoked. He has never used smokeless tobacco. He reports that he drinks alcohol. He reports that he does not use drugs. family history includes Cancer in his brother, cousin, father, and mother; Diabetes in his father; Hypertension in his father; Pancreatic cancer in his mother; Prostate cancer in his father and paternal uncle. No Known Allergies Current Outpatient Prescriptions on File Prior to Visit  Medication Sig Dispense Refill  . aspirin 81 MG tablet Take 81 mg by mouth daily.      . carvedilol (COREG) 6.25 MG tablet Take 1 tablet (6.25 mg total) by mouth 2 (two) times daily. 180 tablet 3  . clopidogrel (PLAVIX) 75 MG tablet take 1 tablet by mouth once daily 30 tablet 11  . furosemide (LASIX) 40 MG tablet Take 40 mg by mouth daily.  0  . hydrALAZINE (APRESOLINE) 25 MG tablet take 1 tablet by mouth three times a day 90 tablet 11  . isosorbide mononitrate (IMDUR) 30 MG 24 hr tablet Take 1 tablet (30 mg total) by mouth daily. 30 tablet 10  . lisinopril (PRINIVIL,ZESTRIL) 20 MG tablet Take 1 tablet (20 mg) in the morning and 1/2 tablet (10 mg) in the  evening. 135 tablet 3  . metFORMIN (GLUCOPHAGE) 1000 MG tablet Take 1 tablet (1,000 mg total) by mouth 2 (two) times daily with a meal. 180 tablet 0  . Multiple Vitamins-Minerals (MULTIVITAMIN WITH MINERALS) tablet Take 1 tablet by mouth daily.      . nitroGLYCERIN (NITROSTAT) 0.4 MG SL tablet Place 0.4 mg under the tongue every 5 (five) minutes as needed for chest pain (x 3 tabs).    . rosuvastatin (CRESTOR) 40 MG tablet take 1 tablet by mouth once daily 30 tablet 11  .  spironolactone (ALDACTONE) 25 MG tablet Take 0.5 tablets (12.5 mg total) by mouth daily. 30 tablet 11  . tetrahydrozoline-zinc (VISINE-AC) 0.05-0.25 % ophthalmic solution Place 1 drop into both eyes daily as needed (allergy irritation.).    Marland Kitchen ZETIA 10 MG tablet take 1 tablet by mouth once daily 30 tablet 11   No current facility-administered medications on file prior to visit.    Review of Systems Constitutional: Negative for increased diaphoresis, or other activity, appetite or siginficant weight change other than noted HENT: Negative for worsening hearing loss, ear pain, facial swelling, mouth sores and neck stiffness.   Eyes: Negative for other worsening pain, redness or visual disturbance.  Respiratory: Negative for choking or stridor Cardiovascular: Negative for other chest pain and palpitations.  Gastrointestinal: Negative for worsening diarrhea, blood in stool, or abdominal distention Genitourinary: Negative for hematuria, flank pain or change in urine volume.  Musculoskeletal: Negative for myalgias or other joint complaints.  Skin: Negative for other color change and wound or drainage.  Neurological: Negative for syncope and numbness. other than noted Hematological: Negative for adenopathy. or other swelling Psychiatric/Behavioral: Negative for hallucinations, SI, self-injury, decreased concentration or other worsening agitation.  All other system neg per pt    Objective:   Physical Exam BP 132/70   Pulse 100   Resp 20   Wt 199 lb (90.3 kg)   SpO2 95%   BMI 30.26 kg/m  VS noted,  Constitutional: Pt is oriented to person, place, and time. Appears well-developed and well-nourished, in no significant distress Head: Normocephalic and atraumatic  Eyes: Conjunctivae and EOM are normal. Pupils are equal, round, and reactive to light Right Ear: External ear normal.  Left Ear: External ear normal Nose: Nose normal.  Mouth/Throat: Oropharynx is clear and moist  Neck: Normal range  of motion. Neck supple. No JVD present. No tracheal deviation present or significant neck LA or mass Cardiovascular: Normal rate, regular rhythm, normal heart sounds and intact distal pulses.   Pulmonary/Chest: Effort normal and breath sounds without rales or wheezing  Abdominal: Soft. Bowel sounds are normal. NT. No HSM  Musculoskeletal: Normal range of motion. Exhibits no edema Lymphadenopathy: Has no cervical adenopathy.  Neurological: Pt is alert and oriented to person, place, and time. Pt has normal reflexes. No cranial nerve deficit. Motor grossly intact Skin: Skin is warm and dry. No rash noted or new ulcers Psychiatric:  Has normal mood and affect. Behavior is normal.  No other exam changes    Assessment & Plan:

## 2016-03-20 NOTE — Patient Instructions (Addendum)
You had the new Prevnar 13 pneumonia shot today  Please continue all other medications as before, and refills have been done if requested.  Please have the pharmacy call with any other refills you may need.  Please continue your efforts at being more active, low cholesterol diet, and weight control.  You are otherwise up to date with prevention measures today.  Please keep your appointments with your specialists as you may have planned  You will be contacted regarding the referral for: colonoscopy  Please go to the LAB in the Basement (turn left off the elevator) for the tests to be done today  You will be contacted by phone if any changes need to be made immediately.  Otherwise, you will receive a letter about your results with an explanation, but please check with MyChart first.  Please remember to sign up for MyChart if you have not done so, as this will be important to you in the future with finding out test results, communicating by private email, and scheduling acute appointments online when needed.  Please return in 6 months, or sooner if needed, with Lab testing done 3-5 days before

## 2016-03-20 NOTE — Progress Notes (Signed)
Pre visit review using our clinic review tool, if applicable. No additional management support is needed unless otherwise documented below in the visit note. 

## 2016-03-21 ENCOUNTER — Encounter: Payer: Self-pay | Admitting: Internal Medicine

## 2016-03-21 LAB — HEPATITIS C ANTIBODY: HCV Ab: NEGATIVE

## 2016-03-24 NOTE — Assessment & Plan Note (Signed)
stable overall by history and exam, recent data reviewed with pt, and pt to continue medical treatment as before,  to f/u any worsening symptoms or concerns Lab Results  Component Value Date   HGBA1C 6.1 03/20/2016    

## 2016-03-24 NOTE — Assessment & Plan Note (Signed)

## 2016-03-25 ENCOUNTER — Telehealth: Payer: Self-pay | Admitting: Physician Assistant

## 2016-03-25 NOTE — Progress Notes (Signed)
I reviewed his case with Dr. Casandra Doffing. No need to pursue CTO of LCx. Long term dual antiplatelet Rx is recommended.  Will therefore continue Plavix + ASA. Richardson Dopp, PA-C   03/25/2016 12:03 PM

## 2016-03-25 NOTE — Telephone Encounter (Signed)
Please tell [name] that I reviewed his case with Dr. Casandra Doffing. I asked him if we need to continue his Plavix.  He thinks that he should stay on this medication. Therefore, continue ASA and Plavix. Richardson Dopp, PA-C   03/25/2016 12:05 PM

## 2016-03-26 NOTE — Telephone Encounter (Signed)
Lmom per Dr. Irish Lack and Richardson Dopp, PA that he will need to continue on both ASA and Plavix. Did asked to please call the office [phone] to let me know that he did get my message and if he has any questions.

## 2016-03-27 MED ORDER — CLOPIDOGREL BISULFATE 75 MG PO TABS: 75.0000 mg | ORAL_TABLET | Freq: Every day | ORAL | 3 refills | Status: DC

## 2016-03-27 NOTE — Telephone Encounter (Signed)
I returned ptcb. I advised pt per Brynda Rim. PA and Dr. Irish Lack that he will need to continue both the ASA and the Plavix. Pt is agreeable to plan of care. I sent in a refill for the Plavix 75 mg daily # 90 x 3 to [facility]. . Pt verbalized understanding to plan of care.

## 2016-03-27 NOTE — Telephone Encounter (Signed)
Lmtcb to advise pt he will need to stay on both ASA and Plavix per Brynda Rim. PA and Dr. Irish Lack. Lmom asking ptcb to let me know he did receive my message.

## 2016-03-27 NOTE — Addendum Note (Signed)
Addended by: Michae Kava on: 03/27/2016 02:31 PM   Modules accepted: Orders

## 2016-04-02 ENCOUNTER — Encounter: Payer: Self-pay | Admitting: Gastroenterology

## 2016-05-09 ENCOUNTER — Ambulatory Visit: Payer: Commercial Managed Care - HMO | Admitting: Cardiology

## 2016-05-27 ENCOUNTER — Telehealth: Payer: Self-pay | Admitting: Emergency Medicine

## 2016-05-27 ENCOUNTER — Ambulatory Visit: Payer: Commercial Managed Care - HMO | Admitting: Physician Assistant

## 2016-05-27 NOTE — Telephone Encounter (Signed)
05/27/2016   RE: LENVILLE RINKE DOB: Dec 01, 1950 MRN: [id number]   Dear Dr. Kathlen Mody,    We have scheduled the above patient for an endoscopic procedure. Our records show that he is on anticoagulation therapy.   Please advise as to how long the patient may come off his therapy of Plavix prior to the procedure, which is scheduled for 06-06-16.  Please fax back/ or route the completed form to Tinnie Gens, [place].   Sincerely,    Tinnie Gens, [place]

## 2016-05-29 ENCOUNTER — Encounter: Payer: Self-pay | Admitting: Physician Assistant

## 2016-05-29 ENCOUNTER — Encounter (INDEPENDENT_AMBULATORY_CARE_PROVIDER_SITE_OTHER): Payer: Self-pay

## 2016-05-29 ENCOUNTER — Ambulatory Visit (INDEPENDENT_AMBULATORY_CARE_PROVIDER_SITE_OTHER): Payer: Medicare HMO | Admitting: Physician Assistant

## 2016-05-29 VITALS — BP 118/74 | HR 72 | Ht 68.0 in | Wt 201.0 lb

## 2016-05-29 DIAGNOSIS — Z1211 Encounter for screening for malignant neoplasm of colon: Secondary | ICD-10-CM

## 2016-05-29 DIAGNOSIS — Z7901 Long term (current) use of anticoagulants: Secondary | ICD-10-CM | POA: Diagnosis not present

## 2016-05-29 DIAGNOSIS — Z01818 Encounter for other preprocedural examination: Secondary | ICD-10-CM | POA: Diagnosis not present

## 2016-05-29 MED ORDER — NA SULFATE-K SULFATE-MG SULF 17.5-3.13-1.6 GM/177ML PO SOLN: 1.0000 | ORAL | 0 refills | Status: DC

## 2016-05-29 NOTE — Telephone Encounter (Signed)
Sorry this was sent to Dr. Kathlen Mody in error. Patient states he sees Dr. Radford Pax. Dr. Radford Pax he has an appointment with you tomorrow please advise if he is stable to hold his Plavix for 5 days for his 3/1 colonoscopy appointment.

## 2016-05-29 NOTE — Progress Notes (Signed)
Chief Complaint: Consult for a screening colonoscopy in a patient on chronic anticoagulation with Plavix  HPI:  Jesse Woods is a 66 year old African-American male with a history of coronary artery disease, dilated cardiomyopathy (last EF 02/04/1734-40%), hyperlipidemia, hypertension and subarachnoid hemorrhage, who presents to clinic today for consultation of his screening colonoscopy    Patient was last seen by Dr. Fuller Plan on 02/07/11 for a screening colonoscopy and at that time there was a finding of a 4 mm sessile polyp in the mid transverse colon as well as internal hemorrhoids. Patient was told to repeat his colonoscopy in 5 years.   Today, the patient presents to clinic and tells me that he is doing well as far as his GI symptoms go and in general. Patient tells me he has been on his Plavix for the past 2 years due to stent placement but has never had to come off of this medication before. He tells me he is having regular bowel movements, denies abdominal pain and has seen no blood in his stool.   Patient denies fever, chills, weight loss, fatigue, anorexia, nausea, vomiting, heartburn, reflux or symptoms that awaken him at night.  Past Medical History:  Diagnosis Date  . Arthritis    "back and left hip" (12/22/2014)  . Coronary artery disease   . DCM (dilated cardiomyopathy) ([place]) 06/22/2014   EF 15%  . Diabetes mellitus, type II ([place])   . History of echocardiogram    Echo 10/17: EF 35-40, inf, inf-lateral and inf-septal AK, Gr 1 DD, mild LAE  . Hyperlipidemia   . Hypertension   . Subarachnoid hemorrhage ([place]) 1998   from HTN    Past Surgical History:  Procedure Laterality Date  . BRAIN SURGERY  1998   right frontal ventriculotomy with subsequent ventriculo-abfdominal shunt due to [facility]  . CARDIAC CATHETERIZATION N/A 12/22/2014   Procedure: Coronary Stent Intervention;  Surgeon: Jettie Booze, MD;  Location: [place] [facility];  Service: Cardiovascular;  Laterality: N/A;  .  CARDIAC CATHETERIZATION  12/22/2014   Procedure: Left Heart Cath and Coronary Angiography;  Surgeon: Jettie Booze, MD;  Location: [place] [facility];  Service: Cardiovascular;;  . CARDIAC CATHETERIZATION  06/24/2014  . CORONARY ANGIOPLASTY    . CSF SHUNT  1998  . INGUINAL HERNIA REPAIR Right 1954  . LEFT HEART CATHETERIZATION WITH CORONARY ANGIOGRAM N/A 06/24/2014   Procedure: LEFT HEART CATHETERIZATION WITH CORONARY ANGIOGRAM;  Surgeon: Jettie Booze, MD;  Location: [facility] CATH LAB;  Service: Cardiovascular;  Laterality: N/A;    Current Outpatient Prescriptions  Medication Sig Dispense Refill  . aspirin 81 MG tablet Take 81 mg by mouth daily.      . carvedilol (COREG) 6.25 MG tablet Take 1 tablet (6.25 mg total) by mouth 2 (two) times daily. 180 tablet 3  . clopidogrel (PLAVIX) 75 MG tablet Take 1 tablet (75 mg total) by mouth daily. 90 tablet 3  . furosemide (LASIX) 40 MG tablet Take 40 mg by mouth daily.  0  . hydrALAZINE (APRESOLINE) 25 MG tablet take 1 tablet by mouth three times a day 90 tablet 11  . isosorbide mononitrate (IMDUR) 30 MG 24 hr tablet Take 1 tablet (30 mg total) by mouth daily. 30 tablet 10  . lisinopril (PRINIVIL,ZESTRIL) 20 MG tablet Take 1 tablet (20 mg) in the morning and 1/2 tablet (10 mg) in the evening. 135 tablet 3  . metFORMIN (GLUCOPHAGE) 1000 MG tablet Take 1 tablet (1,000 mg total) by mouth 2 (two)  times daily with a meal. 180 tablet 0  . Multiple Vitamins-Minerals (MULTIVITAMIN WITH MINERALS) tablet Take 1 tablet by mouth daily.      . nitroGLYCERIN (NITROSTAT) 0.4 MG SL tablet Place 0.4 mg under the tongue every 5 (five) minutes as needed for chest pain (x 3 tabs).    . rosuvastatin (CRESTOR) 40 MG tablet take 1 tablet by mouth once daily 30 tablet 11  . spironolactone (ALDACTONE) 25 MG tablet Take 0.5 tablets (12.5 mg total) by mouth daily. 30 tablet 11  . tetrahydrozoline-zinc (VISINE-AC) 0.05-0.25 % ophthalmic solution Place 1 drop into both eyes  daily as needed (allergy irritation.).    Marland Kitchen ZETIA 10 MG tablet take 1 tablet by mouth once daily 30 tablet 11   No current facility-administered medications for this visit.     Allergies as of 05/29/2016  . (No Known Allergies)    Family History  Problem Relation Age of Onset  . Cancer Mother     pancreatic  . Pancreatic cancer Mother   . Diabetes Father   . Hypertension Father   . Cancer Father     prostate cancer  . Prostate cancer Father   . Cancer Brother     prostate cancer - cause of death  . Cancer Cousin     prostate cancer  . Prostate cancer Paternal Uncle     Social History   Social History  . Marital status: Divorced    Spouse name: N/A  . Number of children: 2  . Years of education: N/A   Occupational History  . retired    Social History Main Topics  . Smoking status: Never Smoker  . Smokeless tobacco: Never Used  . Alcohol use Yes     Comment: 12/22/2014 "no alcohol since 1998"  . Drug use: No  . Sexual activity: Not Currently    Partners: Female   Other Topics Concern  . Not on file   Social History Narrative   HSG, Graduated [employer]. Married- '72-'86; remarried '86-'90, single. 1 son- '73, 1 daughter-'79; 2 grandchildren. Retired after [employer]	[place], worked for [employer] before retirement. Had served in [employer] force. He did live in [place]. Lives together with his son. Very active in his church    Review of Systems:    Constitutional: No weight loss, fever or chills Skin: No rash  Cardiovascular: No chest pain Respiratory: No SOB  Gastrointestinal: See HPI and otherwise negative Genitourinary: No dysuria or change in urinary frequency Neurological: No headache, dizziness or syncope Musculoskeletal: No new muscle or joint pain Hematologic: No bleeding Psychiatric: No history of depression or anxiety   Physical Exam:  Vital signs: BP 118/74   Pulse 72   Ht 5\' 8"  (1.727 m)   Wt 201 lb (91.2 kg)    BMI 30.56 kg/m   Constitutional:   Pleasant Overweight African-American male appears to be in NAD, Well developed, Well nourished, alert and cooperative Head:  Normocephalic and atraumatic. Eyes:   PEERL, EOMI. No icterus. Conjunctiva pink. Ears:  Normal auditory acuity. Neck:  Supple Throat: Oral cavity and pharynx without inflammation, swelling or lesion.  Respiratory: Respirations even and unlabored. Lungs clear to auscultation bilaterally.   No wheezes, crackles, or rhonchi.  Cardiovascular: Normal S1, S2. No MRG. Regular rate and rhythm. No peripheral edema, cyanosis or pallor.  Gastrointestinal:  Soft, nondistended, nontender. No rebound or guarding. Normal bowel sounds. No appreciable masses or hepatomegaly. Rectal:  Not performed.  Msk:  Symmetrical without  gross deformities. Without edema, no deformity or joint abnormality.  Neurologic:  Alert and  oriented x4;  grossly normal neurologically.  Skin:   Dry and intact without significant lesions or rashes. Psychiatric:  Demonstrates good judgement and reason without abnormal affect or behaviors.  MOST RECENT LABS: CBC    Component Value Date/Time   WBC 9.7 03/20/2016 1622   RBC 4.35 03/20/2016 1622   HGB 12.8 (L) 03/20/2016 1622   HCT 38.7 (L) 03/20/2016 1622   PLT 266.0 03/20/2016 1622   MCV 89.0 03/20/2016 1622   MCH 30.1 01/13/2015 1026   MCHC 33.1 03/20/2016 1622   RDW 12.7 03/20/2016 1622   LYMPHSABS 1.4 03/20/2016 1622   MONOABS 0.8 03/20/2016 1622   EOSABS 0.2 03/20/2016 1622   BASOSABS 0.0 03/20/2016 1622    CMP     Component Value Date/Time   NA 141 03/20/2016 1622   K 4.6 03/20/2016 1622   CL 105 03/20/2016 1622   CO2 28 03/20/2016 1622   GLUCOSE 137 (H) 03/20/2016 1622   BUN 23 03/20/2016 1622   CREATININE 1.39 03/20/2016 1622   CREATININE 1.27 (H) 03/08/2016 1001   CALCIUM 9.7 03/20/2016 1622   PROT 7.5 03/20/2016 1622   ALBUMIN 4.6 03/20/2016 1622   AST 20 03/20/2016 1622   ALT 24 03/20/2016  1622   ALKPHOS 38 (L) 03/20/2016 1622   BILITOT 0.5 03/20/2016 1622   GFRNONAA >60 12/23/2014 0349   GFRAA >60 12/23/2014 0349    Assessment: 1. Screening colonoscopy in a patient on chronic anticoagulation: Patient's last screening colonoscopy was performed in 2012 with recommendations for repeat in 5 years, patient is overdue at this time, he is maintained on Plavix for stent placement 2 years ago  Plan: 1. Patient is scheduled for a screening colonoscopy on March 1 with Dr. Fuller Plan in the [facility]. Rediscussed the risks, benefits, limitations and alternatives and patient agrees to proceed. 2. Patient was advised to hold his Plavix for 5 days prior to this procedure. We will consult with his cardiologist Dr. Radford Pax to ensure that holding his Plavix is acceptable. 3. Patient to follow in clinic per Dr. Lynne Leader recommendations after time of procedure.  Ellouise Newer, PA-C [facility] 05/29/2016, 3:24 PM  Cc: Biagio Borg, MD

## 2016-05-29 NOTE — Telephone Encounter (Signed)
OK to hold plavix 5 days prior to colonoscopy.  

## 2016-05-29 NOTE — Progress Notes (Signed)
Reviewed and agree with management plan.  Kiree Dejarnette T. Darald Uzzle, MD FACG 

## 2016-05-29 NOTE — Telephone Encounter (Signed)
Spoke to patient and informed him. Verified medication and date to stop. He verbalized understanding.

## 2016-05-29 NOTE — Patient Instructions (Signed)

## 2016-05-30 ENCOUNTER — Encounter: Payer: Self-pay | Admitting: Cardiology

## 2016-05-30 ENCOUNTER — Ambulatory Visit (INDEPENDENT_AMBULATORY_CARE_PROVIDER_SITE_OTHER): Payer: Medicare HMO | Admitting: Cardiology

## 2016-05-30 VITALS — BP 136/78 | HR 84 | Ht 69.0 in | Wt 203.4 lb

## 2016-05-30 DIAGNOSIS — E785 Hyperlipidemia, unspecified: Secondary | ICD-10-CM | POA: Diagnosis not present

## 2016-05-30 DIAGNOSIS — I10 Essential (primary) hypertension: Secondary | ICD-10-CM | POA: Diagnosis not present

## 2016-05-30 DIAGNOSIS — I5022 Chronic systolic (congestive) heart failure: Secondary | ICD-10-CM | POA: Diagnosis not present

## 2016-05-30 DIAGNOSIS — I2510 Atherosclerotic heart disease of native coronary artery without angina pectoris: Secondary | ICD-10-CM

## 2016-05-30 DIAGNOSIS — I255 Ischemic cardiomyopathy: Secondary | ICD-10-CM | POA: Diagnosis not present

## 2016-05-30 MED ORDER — CARVEDILOL 12.5 MG PO TABS: 12.5000 mg | ORAL_TABLET | Freq: Two times a day (BID) | ORAL | 11 refills | Status: DC

## 2016-05-30 NOTE — Patient Instructions (Addendum)
Medication Instructions:  1) INCREASE COREG to 12.5 mg TWICE DAILY  Labwork: None  Testing/Procedures: None  Follow-Up: Your physician recommends that you schedule a follow-up appointment on Thursday, March 8 at 11:30AM with the pharmacist in the [facility].  Your physician wants you to follow-up in: 6 months with Dr. Theodosia Blender assistant. You will receive a reminder letter in the mail two months in advance. If you don't receive a letter, please call our office to schedule the follow-up appointment.   Your physician wants you to follow-up in: 1 year with Dr. Radford Pax. You will receive a reminder letter in the mail two months in advance. If you don't receive a letter, please call our office to schedule the follow-up appointment.   Any Other Special Instructions Will Be Listed Below (If Applicable).     If you need a refill on your cardiac medications before your next appointment, please call your pharmacy.

## 2016-05-30 NOTE — Progress Notes (Signed)
Cardiology Office Note    Date:  05/30/2016   ID:  Jesse Woods, Space 04/06/1951, MRN [id number]  PCP:  Cathlean Cower, MD  Cardiologist:  Fransico Him, MD   Chief Complaint  Patient presents with  . Coronary Artery Disease  . Hypertension  . Hyperlipidemia    History of Present Illness:  Jesse Woods is a 66 y.o. male with a hx of HTN, HL, DM2, remote history of subarachnoid hemorrhage, CAD and ischemic cardiomyopathy. He was noted to have chronic total occlusion of mid LCx and severely diseased RCA on previous cath in February 2016. EF at that time was 15%. A FUecho in August 2016 showed EF had improved to 35-40%. He was cleared by neurosurgery for DAPT and then wasseen by Dr. Irish Lack to proceed with PCI of RCA. LHC 12/22/2014 showed 90% proximal to mid RCA lesion that treated with 3.0 x 32 mm Synergy DES. Chronic total occlusion of mid left circumflex with left to left collaterals. Normal LVEDP 5 mmHg. His ACE I was changed to [facility] but unfortunately, his insurance did not cover Entresto and it was too expensive. He is back on Lisinopril 20 bid.  He is doing well.  He denies chest pain, shortness of breath, orthopnea, PND, LE edema, dizziness, palpitations or syncope.     Past Medical History:  Diagnosis Date  . Arthritis    "back and left hip" (12/22/2014)  . Chronic systolic CHF (congestive heart failure), NYHA class 2 ([place])   . Coronary artery disease 01/2015   chronic total occlusion of mid LCx and severely diseased RCA on previous cath in February 2016. EF at that time was 15%.He was cleared by neurosurgery for DAPT and then wasseen by Dr. Irish Lack to proceed with PCI of RCA. LHC 12/22/2014 showed 90% proximal to mid RCA lesion that treated with 3.0 x 32 mm Synergy DES  . DCM (dilated cardiomyopathy) ([place]) 06/22/2014   EF was 15% but now 35-40% after PCI  . Diabetes mellitus, type II ([place])   . Hyperlipidemia   . Hypertension   . Subarachnoid hemorrhage ([place])  1998   from HTN    Past Surgical History:  Procedure Laterality Date  . BRAIN SURGERY  1998   right frontal ventriculotomy with subsequent ventriculo-abfdominal shunt due to [facility]  . CARDIAC CATHETERIZATION N/A 12/22/2014   Procedure: Coronary Stent Intervention;  Surgeon: Jettie Booze, MD;  Location: [place] [facility];  Service: Cardiovascular;  Laterality: N/A;  . CARDIAC CATHETERIZATION  12/22/2014   Procedure: Left Heart Cath and Coronary Angiography;  Surgeon: Jettie Booze, MD;  Location: [place] [facility];  Service: Cardiovascular;;  . CARDIAC CATHETERIZATION  06/24/2014  . CORONARY ANGIOPLASTY    . CSF SHUNT  1998  . INGUINAL HERNIA REPAIR Right 1954  . LEFT HEART CATHETERIZATION WITH CORONARY ANGIOGRAM N/A 06/24/2014   Procedure: LEFT HEART CATHETERIZATION WITH CORONARY ANGIOGRAM;  Surgeon: Jettie Booze, MD;  Location: [facility] CATH LAB;  Service: Cardiovascular;  Laterality: N/A;    Current Medications: Current Meds  Medication Sig  . aspirin 81 MG tablet Take 81 mg by mouth daily.    . carvedilol (COREG) 6.25 MG tablet Take 1 tablet (6.25 mg total) by mouth 2 (two) times daily.  . clopidogrel (PLAVIX) 75 MG tablet Take 1 tablet (75 mg total) by mouth daily.  . furosemide (LASIX) 40 MG tablet Take 40 mg by mouth daily.  . hydrALAZINE (APRESOLINE) 25 MG tablet take 1 tablet by mouth  three times a day  . isosorbide mononitrate (IMDUR) 30 MG 24 hr tablet Take 1 tablet (30 mg total) by mouth daily.  Marland Kitchen lisinopril (PRINIVIL,ZESTRIL) 20 MG tablet Take 1 tablet (20 mg) in the morning and 1/2 tablet (10 mg) in the evening.  . metFORMIN (GLUCOPHAGE) 1000 MG tablet Take 1 tablet (1,000 mg total) by mouth 2 (two) times daily with a meal.  . Multiple Vitamins-Minerals (MULTIVITAMIN WITH MINERALS) tablet Take 1 tablet by mouth daily.    . Na Sulfate-K Sulfate-Mg Sulf (SUPREP BOWEL PREP KIT) 17.5-3.13-1.6 GM/180ML SOLN Take 1 kit by mouth as directed.  . nitroGLYCERIN  (NITROSTAT) 0.4 MG SL tablet Place 0.4 mg under the tongue every 5 (five) minutes as needed for chest pain (x 3 tabs).  . rosuvastatin (CRESTOR) 40 MG tablet take 1 tablet by mouth once daily  . spironolactone (ALDACTONE) 25 MG tablet Take 0.5 tablets (12.5 mg total) by mouth daily.  Marland Kitchen tetrahydrozoline-zinc (VISINE-AC) 0.05-0.25 % ophthalmic solution Place 1 drop into both eyes daily as needed (allergy irritation.).  Marland Kitchen ZETIA 10 MG tablet take 1 tablet by mouth once daily    Allergies:   Patient has no known allergies.   Social History   Social History  . Marital status: Divorced    Spouse name: N/A  . Number of children: 2  . Years of education: N/A   Occupational History  . retired    Social History Main Topics  . Smoking status: Never Smoker  . Smokeless tobacco: Never Used  . Alcohol use Yes     Comment: 12/22/2014 "no alcohol since 1998"  . Drug use: No  . Sexual activity: Not Currently    Partners: Female   Other Topics Concern  . None   Social History Narrative   HSG, Graduated [employer]. Married- '72-'86; remarried '86-'90, single. 1 son- '73, 1 daughter-'79; 2 grandchildren. Retired after [employer]	[place], worked for [employer] before retirement. Had served in [employer] force. He did live in [place]. Lives together with his son. Very active in his church     Family History:  The patient's family history includes Cancer in his brother, cousin, father, and mother; Diabetes in his father; Hypertension in his father; Pancreatic cancer in his mother; Prostate cancer in his father and paternal uncle.   ROS:   Please see the history of present illness.    ROS All other systems reviewed and are negative.  No flowsheet data found.     PHYSICAL EXAM:   VS:  BP 136/78   Pulse 84   Ht 5' 9"  (1.753 m)   Wt 203 lb 6.4 oz (92.3 kg)   SpO2 96%   BMI 30.04 kg/m    GEN: Well nourished, well developed, in no acute distress  HEENT: normal    Neck: no JVD, carotid bruits, or masses Cardiac: RRR; no murmurs, rubs, or gallops,no edema.  Intact distal pulses bilaterally.  Respiratory:  clear to auscultation bilaterally, normal work of breathing GI: soft, nontender, nondistended, + BS MS: no deformity or atrophy  Skin: warm and dry, no rash Neuro:  Alert and Oriented x 3, Strength and sensation are intact Psych: euthymic mood, full affect  Wt Readings from Last 3 Encounters:  05/30/16 203 lb 6.4 oz (92.3 kg)  05/29/16 201 lb (91.2 kg)  03/20/16 199 lb (90.3 kg)      Studies/Labs Reviewed:   EKG:  EKG is not ordered today.    Recent Labs: 03/20/2016: ALT 24;  BUN 23; Creatinine, Ser 1.39; Hemoglobin 12.8; Platelets 266.0; Potassium 4.6; Sodium 141; TSH 1.00   Lipid Panel    Component Value Date/Time   CHOL 105 03/20/2016 1622   TRIG 141.0 03/20/2016 1622   HDL 29.10 (L) 03/20/2016 1622   CHOLHDL 4 03/20/2016 1622   VLDL 28.2 03/20/2016 1622   LDLCALC 48 03/20/2016 1622   LDLDIRECT 215.3 01/03/2011 1035    Additional studies/ records that were reviewed today include:  none    ASSESSMENT:    1. Coronary artery disease involving native coronary artery of native heart without angina pectoris   2. Chronic systolic CHF (congestive heart failure) ([place])   3. Ischemic cardiomyopathy   4. Essential hypertension   5. Dyslipidemia   6. Chronic systolic CHF (congestive heart failure), NYHA class 2 ([facility])      PLAN:  In order of problems listed above:  1. ASCAD - He had chronic total occlusion of mid LCx and severely diseased RCA on previous cath in February 2016. EF at that time was 15%.He was cleared by neurosurgery for DAPT and then wasseen by Dr. Irish Lack to proceed with PCI of RCA. LHC 12/22/2014 showed 90% proximal to mid RCA lesion that treated with 3.0 x 32 mm Synergy DES. Chronic total occlusion of mid left circumflex with left to left collaterals.  He has not had any anginal symptoms.  He will continue on  ASAPlavix/long acting nitrate/BB and statin.  2. Chronic systolic CHF - he is NYHA class II.  He will continue on ACE I/Hydralazine/Imdur/Aldactone/BB and Lasix.  He does not appear volume overloaded on exam and weight is stable. I am going to continue to titrate coreg as BP tolerates.  Will increase Coreg to 122.30m BID and have him followup with our PharmD in 2 weeks.  If BP stable would continue to titrate to a goal of 216mBID if BP and HR allow.  Would also try to increase ACE I once Coreg at max tolerated dose as long as renal function remains stable.  He will followup with PA in 6 weeks.  3. Ischemic DCM with EF 35-40% by last echo.  No ICD indicated at this time.   4. HTN - Bp controlled on current meds.  He will continue on ACE I/Hydralazine/BB.  Increase BB as above  5. Hyperlipidemia - LDL goal < 70.  He will continue on statin therapy. LDL at goal at 48.    Medication Adjustments/Labs and Tests Ordered: Current medicines are reviewed at length with the patient today.  Concerns regarding medicines are outlined above.  Medication changes, Labs and Tests ordered today are listed in the Patient Instructions below.  There are no Patient Instructions on file for this visit.   Signed, TrFransico HimMD  05/30/2016 3:26 PM    [email or web address]  [id number]: [phone]: [phone]

## 2016-06-06 ENCOUNTER — Ambulatory Visit (AMBULATORY_SURGERY_CENTER): Payer: Medicare HMO | Admitting: Gastroenterology

## 2016-06-06 ENCOUNTER — Encounter: Payer: Self-pay | Admitting: Gastroenterology

## 2016-06-06 VITALS — BP 121/81 | HR 75 | Temp 96.8°F | Resp 15 | Ht 68.0 in | Wt 201.0 lb

## 2016-06-06 DIAGNOSIS — I2510 Atherosclerotic heart disease of native coronary artery without angina pectoris: Secondary | ICD-10-CM | POA: Diagnosis not present

## 2016-06-06 DIAGNOSIS — Z1211 Encounter for screening for malignant neoplasm of colon: Secondary | ICD-10-CM | POA: Diagnosis not present

## 2016-06-06 DIAGNOSIS — Z86010 Personal history of colonic polyps: Secondary | ICD-10-CM | POA: Diagnosis present

## 2016-06-06 DIAGNOSIS — E119 Type 2 diabetes mellitus without complications: Secondary | ICD-10-CM | POA: Diagnosis not present

## 2016-06-06 DIAGNOSIS — I10 Essential (primary) hypertension: Secondary | ICD-10-CM | POA: Diagnosis not present

## 2016-06-06 MED ORDER — SODIUM CHLORIDE 0.9 % IV SOLN: 500.0000 mL | INTRAVENOUS | Status: DC

## 2016-06-06 NOTE — Op Note (Signed)
[place] Patient Name: Jesse Woods Procedure Date: 06/06/2016 8:58 AM MRN: [id number] Endoscopist: Ladene Artist , MD Age: 66 Referring MD:  Date of Birth: 1950/07/02 Gender: Male Account #: [id number] Procedure:                Colonoscopy Indications:              Surveillance: Personal history of adenomatous                            polyps on last colonoscopy > 5 years ago Medicines:                Monitored Anesthesia Care Procedure:                Pre-Anesthesia Assessment:                           - Prior to the procedure, a History and Physical                            was performed, and patient medications and                            allergies were reviewed. The patient's tolerance of                            previous anesthesia was also reviewed. The risks                            and benefits of the procedure and the sedation                            options and risks were discussed with the patient.                            All questions were answered, and informed consent                            was obtained. Prior Anticoagulants: The patient has                            taken Plavix (clopidogrel), last dose was 2 days                            prior to procedure. ASA Grade Assessment: III - A                            patient with severe systemic disease. After                            reviewing the risks and benefits, the patient was                            deemed in satisfactory condition to undergo the  procedure.                           After obtaining informed consent, the colonoscope                            was passed under direct vision. Throughout the                            procedure, the patient's blood pressure, pulse, and                            oxygen saturations were monitored continuously. The                            Colonoscope was introduced through the anus and                    advanced to the the cecum, identified by                            appendiceal orifice and ileocecal valve. The                            ileocecal valve, appendiceal orifice, and rectum                            were photographed. The quality of the bowel                            preparation was good. The colonoscopy was performed                            without difficulty. The patient tolerated the                            procedure well. Scope In: 9:02:13 AM Scope Out: 9:12:40 AM Scope Withdrawal Time: 0 hours 7 minutes 25 seconds  Total Procedure Duration: 0 hours 10 minutes 27 seconds  Findings:                 The perianal and digital rectal examinations were                            normal.                           Internal hemorrhoids were found during                            retroflexion. The hemorrhoids were small and Grade                            I (internal hemorrhoids that do not prolapse).                           The exam was otherwise without  abnormality on                            direct and retroflexion views. Complications:            No immediate complications. Estimated blood loss:                            None. Estimated Blood Loss:     Estimated blood loss: none. Impression:               - Internal hemorrhoids.                           - The examination was otherwise normal on direct                            and retroflexion views.                           - No specimens collected. Recommendation:           - Repeat colonoscopy in 5 years for surveillance                            purposes.                           - Resume Plavix (clopidogrel) today at prior dose.                            Refer to managing physician for further adjustment                            of therapy.                           - Patient has a contact number available for                            emergencies. The signs and symptoms of  potential                            delayed complications were discussed with the                            patient. Return to normal activities tomorrow.                            Written discharge instructions were provided to the                            patient.                           - Resume previous diet.                           -  Continue present medications. Ladene Artist, MD 06/06/2016 9:21:00 AM This report has been signed electronically.

## 2016-06-06 NOTE — Progress Notes (Signed)
Report to PACU, RN, vss, BBS= Clear.  

## 2016-06-06 NOTE — Patient Instructions (Addendum)
YOU HAD AN ENDOSCOPIC PROCEDURE TODAY AT [facility]:   Refer to the procedure report that was given to you for any specific questions about what was found during the examination.  If the procedure report does not answer your questions, please call your gastroenterologist to clarify.  If you requested that your care partner not be given the details of your procedure findings, then the procedure report has been included in a sealed envelope for you to review at your convenience later.  YOU SHOULD EXPECT: Some feelings of bloating in the abdomen. Passage of more gas than usual.  Walking can help get rid of the air that was put into your GI tract during the procedure and reduce the bloating. If you had a lower endoscopy (such as a colonoscopy or flexible sigmoidoscopy) you may notice spotting of blood in your stool or on the toilet paper. If you underwent a bowel prep for your procedure, you may not have a normal bowel movement for a few days.  Please Note:  You might notice some irritation and congestion in your nose or some drainage.  This is from the oxygen used during your procedure.  There is no need for concern and it should clear up in a day or so.  SYMPTOMS TO REPORT IMMEDIATELY:   Following lower endoscopy (colonoscopy or flexible sigmoidoscopy):  Excessive amounts of blood in the stool  Significant tenderness or worsening of abdominal pains  Swelling of the abdomen that is new, acute  Fever of 100F or higher   For urgent or emergent issues, a gastroenterologist can be reached at any hour by calling [phone].   DIET:  We do recommend a small meal at first, but then you may proceed to your regular diet.  Drink plenty of fluids but you should avoid alcoholic beverages for 24 hours.  ACTIVITY:  You should plan to take it easy for the rest of today and you should NOT DRIVE or use heavy machinery until tomorrow (because of the sedation medicines used during the test).     FOLLOW UP: Our staff will call the number listed on your records the next business day following your procedure to check on you and address any questions or concerns that you may have regarding the information given to you following your procedure. If we do not reach you, we will leave a message.  However, if you are feeling well and you are not experiencing any problems, there is no need to return our call.  We will assume that you have returned to your regular daily activities without incident.  If any biopsies were taken you will be contacted by phone or by letter within the next 1-3 weeks.  Please call us at [phone] if you have not heard about the biopsies in 3 weeks.    SIGNATURES/CONFIDENTIALITY: You and/or your care partner have signed paperwork which will be entered into your electronic medical record.  These signatures attest to the fact that that the information above on your After Visit Summary has been reviewed and is understood.  Full responsibility of the confidentiality of this discharge information lies with you and/or your care-partner.   Information on hemorrhoids given to you today  RESUME PLAVIX TODAY AT PRIOR DOSAGE  REPEAT COLONOSCOPY IN 5 YEARS

## 2016-06-07 ENCOUNTER — Telehealth: Payer: Self-pay | Admitting: *Deleted

## 2016-06-07 NOTE — Telephone Encounter (Signed)
  Follow up Call-  Call back number 06/06/2016  Post procedure Call Back phone  # [phone]  Permission to leave phone message Yes  Some recent data might be hidden     Patient questions:  Do you have a fever, pain , or abdominal swelling? No. Pain Score  0 *  Have you tolerated food without any problems? Yes.    Have you been able to return to your normal activities? Yes.    Do you have any questions about your discharge instructions: Diet   No. Medications  No. Follow up visit  No.  Do you have questions or concerns about your Care? No.  Actions: * If pain score is 4 or above: No action needed, pain <4.

## 2016-06-13 ENCOUNTER — Other Ambulatory Visit: Payer: Self-pay | Admitting: Physician Assistant

## 2016-06-13 ENCOUNTER — Ambulatory Visit (INDEPENDENT_AMBULATORY_CARE_PROVIDER_SITE_OTHER): Payer: Medicare HMO | Admitting: Pharmacist

## 2016-06-13 VITALS — BP 130/82 | HR 67

## 2016-06-13 DIAGNOSIS — I5022 Chronic systolic (congestive) heart failure: Secondary | ICD-10-CM | POA: Diagnosis not present

## 2016-06-13 DIAGNOSIS — I10 Essential (primary) hypertension: Secondary | ICD-10-CM

## 2016-06-13 MED ORDER — CARVEDILOL 12.5 MG PO TABS: 18.7500 mg | ORAL_TABLET | Freq: Two times a day (BID) | ORAL | 11 refills | Status: DC

## 2016-06-13 MED ORDER — LISINOPRIL 40 MG PO TABS: ORAL_TABLET | ORAL | 11 refills | Status: DC

## 2016-06-13 NOTE — Progress Notes (Signed)
Patient ID: Jesse Woods                 DOB: [birth year]                      MRN: [id number]     HPI: Jesse Woods is a very pleasant 66 y.o. male referred by Dr. Radford Pax to HTN clinic for HF medication optimization. PMH is significant for HTN, HLD, DM2, remote history of subarachnoid hemorrhage, CAD and ischemic cardiomyopathy. Cath in February 2016 showed chronic total occlusion of the mid L Cx and severely diseased RCA. He had a LHC in September 2016 and mid RCA was treated with DES. Echo in August 2016 showed LVEF improved to 35-40% from 15% previously in February 2016. He was switched to [facility] but his copay was too expensive and he was switched back to ACEi. His carvedilol was titrated to 12.5mg  BID 2 weeks ago and he presents today for further HF medication optimization.  Pt reports tolerating medications well. He denies dizziness, headache, blurred vision, and headache. Reports adherence with his medications. He does not check his BP at home. He tries to limit salt in his diet and weighs himself most days a week. He likes to walk frequently but has not done this as much recently because of the weather. There is a rec center near his home that he plans on starting to use again more frequently.  Current HTN meds: carvedilol 12.5mg  BID, lisinopril 20mg  AM and 10mg  PM, spironolactone 12.5mg  daily, Imdur 30mg  daily, hydralazine 25mg  TID, furosemide 40mg  daily Previously tried: Entresto - too expensive BP goal: <130/34mmHg   Family History: The patient's family history includes Cancer in his brother, cousin, father, and mother; Diabetes in his father; Hypertension in his father; Pancreatic cancer in his mother; Prostate cancer in his father and paternal uncle.   Social History: Denies tobacco, alcohol, and illicit drug use.  Diet: Avoids adding salt to his food. Weighs himself almost daily.  Exercise: Wants to start walking again. Used to walk 1-2x per day for 1 mile each but hasn't  done this recently due to the weather.  Home BP readings: Does not check his BP at home.  Wt Readings from Last 3 Encounters:  06/06/16 201 lb (91.2 kg)  05/30/16 203 lb 6.4 oz (92.3 kg)  05/29/16 201 lb (91.2 kg)   BP Readings from Last 3 Encounters:  06/06/16 121/81  05/30/16 136/78  05/29/16 118/74   Pulse Readings from Last 3 Encounters:  06/06/16 75  05/30/16 84  05/29/16 72    Renal function: CrCl cannot be calculated (Patient's most recent lab result is older than the maximum 21 days allowed.).  Past Medical History:  Diagnosis Date  . Arthritis    "back and left hip" (12/22/2014)  . Chronic systolic CHF (congestive heart failure), NYHA class 2 ([place])   . Coronary artery disease 01/2015   chronic total occlusion of mid LCx and severely diseased RCA on previous cath in February 2016. EF at that time was 15%.He was cleared by neurosurgery for DAPT and then wasseen by Dr. Irish Lack to proceed with PCI of RCA. LHC 12/22/2014 showed 90% proximal to mid RCA lesion that treated with 3.0 x 32 mm Synergy DES  . DCM (dilated cardiomyopathy) ([place]) 06/22/2014   EF was 15% but now 35-40% after PCI  . Diabetes mellitus, type II ([place])   . Hyperlipidemia   . Hypertension   . Subarachnoid hemorrhage ([place]) 1998  from HTN    Current Outpatient Prescriptions on File Prior to Visit  Medication Sig Dispense Refill  . aspirin 81 MG tablet Take 81 mg by mouth daily.      . carvedilol (COREG) 12.5 MG tablet Take 1 tablet (12.5 mg total) by mouth 2 (two) times daily. 60 tablet 11  . clopidogrel (PLAVIX) 75 MG tablet Take 1 tablet (75 mg total) by mouth daily. 90 tablet 3  . furosemide (LASIX) 40 MG tablet Take 40 mg by mouth daily.  0  . hydrALAZINE (APRESOLINE) 25 MG tablet take 1 tablet by mouth three times a day 90 tablet 11  . isosorbide mononitrate (IMDUR) 30 MG 24 hr tablet Take 1 tablet (30 mg total) by mouth daily. 30 tablet 10  . lisinopril (PRINIVIL,ZESTRIL) 20 MG tablet Take 1  tablet (20 mg) in the morning and 1/2 tablet (10 mg) in the evening. 135 tablet 3  . metFORMIN (GLUCOPHAGE) 1000 MG tablet Take 1 tablet (1,000 mg total) by mouth 2 (two) times daily with a meal. 180 tablet 0  . Multiple Vitamins-Minerals (MULTIVITAMIN WITH MINERALS) tablet Take 1 tablet by mouth daily.      . nitroGLYCERIN (NITROSTAT) 0.4 MG SL tablet Place 0.4 mg under the tongue every 5 (five) minutes as needed for chest pain (x 3 tabs).    . rosuvastatin (CRESTOR) 40 MG tablet take 1 tablet by mouth once daily 30 tablet 11  . spironolactone (ALDACTONE) 25 MG tablet Take 0.5 tablets (12.5 mg total) by mouth daily. 30 tablet 11  . tetrahydrozoline-zinc (VISINE-AC) 0.05-0.25 % ophthalmic solution Place 1 drop into both eyes daily as needed (allergy irritation.).    Marland Kitchen ZETIA 10 MG tablet take 1 tablet by mouth once daily 30 tablet 11   Current Facility-Administered Medications on File Prior to Visit  Medication Dose Route Frequency Provider Last Rate Last Dose  . 0.9 %  sodium chloride infusion  500 mL Intravenous Continuous Ladene Artist, MD        No Known Allergies   Assessment/Plan:  1. Hypertension/HF medication optimization: BP stable ~130/49mmHg, HR mid to upper 60s. Will continue to optimize HF medications and increase carvedilol to 18.75mg  BID and increase lisinopril to 40mg  daily. Continue other meds. Will f/u with BP check and BMET in 3 weeks.   Jimmy Stipes E. Tamelia Michalowski, PharmD, CPP, [facility] [address] [place], [place] [address] Phone: [phone]; Fax: [phone] 06/13/2016 11:40 AM

## 2016-06-13 NOTE — Patient Instructions (Addendum)
Increase your carvedilol to 1.5 tablets twice a day  Increase your lisinopril to 40mg  daily (you can take 2 of your 20mg  tablets at the same time until you run out). I sent a refill in for the higher 40mg  strength to start taking 1 tablet a day after  Follow up in clinic for blood pressure and lab check in 3 weeks

## 2016-06-14 ENCOUNTER — Other Ambulatory Visit: Payer: Self-pay | Admitting: Interventional Cardiology

## 2016-06-14 ENCOUNTER — Other Ambulatory Visit: Payer: Self-pay | Admitting: Cardiology

## 2016-06-14 ENCOUNTER — Other Ambulatory Visit: Payer: Self-pay | Admitting: Physician Assistant

## 2016-06-14 NOTE — Telephone Encounter (Signed)
AVS Reports   Date/Time Report Action User  03/08/2016 9:47 AM After Visit Summary Printed Michae Kava, CMA  Patient Instructions   Medication Instructions:  1. DECREASE LISINOPRIL TO 20 MG IN THE MORNING AND 10 MG IN THE PM 2. INCREASE COREG TO 6.25 MG TWICE DAILY 3. A ONE TIME REFILL FOR METFORMIN HAS BEEN SENT IN; YOU WILL NEED FURTHER REFILLS TO BE DONE BY YOUR PRIMARY CARE

## 2016-06-17 ENCOUNTER — Other Ambulatory Visit: Payer: Self-pay | Admitting: Cardiology

## 2016-06-17 ENCOUNTER — Other Ambulatory Visit: Payer: Self-pay | Admitting: Internal Medicine

## 2016-06-17 NOTE — Telephone Encounter (Signed)
Pharmacy has requested this multiple times and I have been refusing it with a note to send refill request to patients pcp. The pharmacy is requesting it again and they sent a fax stating that "patient only sees physicians at this office so this is his primary care office!" Please advise. Thanks, MI

## 2016-06-17 NOTE — Telephone Encounter (Signed)
I received a message from [facility] [place] in our refill dept who states pharmacy is requesting a refill for Metformin for pt.  We have authorized a couple of refills in the past for the pt due to needing a PCP. Pt has a PCP , Dr. Cathlean Cower with [facility]. I lmom on pt's phone that we only authorized a couple of times for the metformin as a courtesy to him. Pt was instructed back in 03/08/16 that we refilled metformin as a 1 time courtesy. I explained to the pharmacist that we will help out our pt's at times if they are needing refills for a non cardiac medication, all though pt are also advised further refills with PCP. Pharmacy thanked me for the help. She said that they did not have Dr. Cathlean Cower on file as PCP and had [place] listed as his PCP. Pharmacist said she will send Rx to Dr. Gwynn Burly office.

## 2016-06-18 NOTE — Telephone Encounter (Signed)
Routing to dr john---you did see patient in dec/2017 for DM--however, dr weaver has been ordering metformin ---receiving request for dr Jenny Reichmann to start ordering at primary care----are you ok with now ordering med for this patient---please advise, thanks

## 2016-06-18 NOTE — Telephone Encounter (Signed)
Left message advising patient that rx has been sent by dr john---call back if any questions

## 2016-06-18 NOTE — Telephone Encounter (Signed)
[place] for refill, thanks

## 2016-07-03 ENCOUNTER — Ambulatory Visit (INDEPENDENT_AMBULATORY_CARE_PROVIDER_SITE_OTHER): Payer: Medicare HMO | Admitting: Pharmacist

## 2016-07-03 ENCOUNTER — Other Ambulatory Visit: Payer: Medicare HMO | Admitting: *Deleted

## 2016-07-03 VITALS — BP 108/68 | HR 76

## 2016-07-03 DIAGNOSIS — I10 Essential (primary) hypertension: Secondary | ICD-10-CM | POA: Diagnosis not present

## 2016-07-03 LAB — BASIC METABOLIC PANEL
BUN/Creatinine Ratio: 14 (ref 10–24)
BUN: 18 mg/dL (ref 8–27)
CO2: 22 mmol/L (ref 18–29)
CREATININE: 1.3 mg/dL — AB (ref 0.76–1.27)
Calcium: 9.4 mg/dL (ref 8.6–10.2)
Chloride: 101 mmol/L (ref 96–106)
GFR calc Af Amer: 65 mL/min/{1.73_m2} (ref >59)
GFR, EST NON AFRICAN AMERICAN: 56 mL/min/{1.73_m2} — AB (ref >59)
GLUCOSE: 146 mg/dL — AB (ref 65–99)
Potassium: 4.7 mmol/L (ref 3.5–5.2)
SODIUM: 140 mmol/L (ref 134–144)

## 2016-07-03 NOTE — Patient Instructions (Addendum)
Continue your current medications  Follow up in blood pressure clinic in 6 weeks on Wednesday May 9th at 11am

## 2016-07-03 NOTE — Progress Notes (Signed)
Patient ID: Jesse Woods                 DOB: 12-Mar-1951                      MRN: [id number]     HPI: Jesse Woods is a very pleasant 66 y.o. male referred by Dr. Radford Pax to HTN clinic for HF medication optimization who presents today for follow up. PMH is significant for HTN, HLD, DM2, remote history of subarachnoid hemorrhage, CAD and ischemic cardiomyopathy. Cath in February 2016 showed chronic total occlusion of the mid L Cx and severely diseased RCA. He had a LHC in September 2016 and mid RCA was treated with DES. Echo in August 2016 showed LVEF improved to 35-40% from 15% previously in February 2016. He was switched to [facility] but his copay was too expensive and he was switched back to ACEi. His carvedilol was titrated to 18.75mg  BID and lisinopril was increased to 40mg  3 weeks ago and he presents today for further HF medication optimization and BMET.  Pt reports feeling wonderful. He increased his lisinopril to 40mg  as directed but has not increased his carvedilol as instructed at last visit. He is still taking 12.5mg  BID. He usually forgets second dose of hydralazine Pt reports tolerating medications well. He denies dizziness, headache, blurred vision, and headache. He does not check his BP at home. He tries to limit salt in his diet and weighs himself most days a week. He likes to walk frequently but has not done this as much recently because of the weather. There is a rec center near his home that he plans on starting to use again more frequently.  Current HTN meds: carvedilol 12.5mg  BID, lisinopril 40mg  daily, spironolactone 12.5mg  daily, Imdur 30mg  daily, hydralazine 25mg  TID, furosemide 40mg  daily Previously tried: Entresto - too expensive BP goal: <130/57mmHg   Family History: The patient's family history includes Cancer in his brother, cousin, father, and mother; Diabetes in his father; Hypertension in his father; Pancreatic cancer in his mother; Prostate cancer in his father  and paternal uncle.  Social History: Denies tobacco, alcohol, and illicit drug use.  Diet: Avoids adding salt to his food. Weighs himself almost daily.  Exercise: Wants to start walking again. Used to walk 1-2x per day for 1 mile each but hasn't done this recently due to the weather.  Home BP readings: Does not check his BP at home.  Wt Readings from Last 3 Encounters:  06/06/16 201 lb (91.2 kg)  05/30/16 203 lb 6.4 oz (92.3 kg)  05/29/16 201 lb (91.2 kg)   BP Readings from Last 3 Encounters:  06/13/16 130/82  06/06/16 121/81  05/30/16 136/78   Pulse Readings from Last 3 Encounters:  06/13/16 67  06/06/16 75  05/30/16 84    Renal function: CrCl cannot be calculated (Patient's most recent lab result is older than the maximum 21 days allowed.).  Past Medical History:  Diagnosis Date  . Arthritis    "back and left hip" (12/22/2014)  . Chronic systolic CHF (congestive heart failure), NYHA class 2 ([place])   . Coronary artery disease 01/2015   chronic total occlusion of mid LCx and severely diseased RCA on previous cath in February 2016. EF at that time was 15%.He was cleared by neurosurgery for DAPT and then wasseen by Dr. Irish Lack to proceed with PCI of RCA. LHC 12/22/2014 showed 90% proximal to mid RCA lesion that treated with 3.0 x 32 mm  Synergy DES  . DCM (dilated cardiomyopathy) ([place]) 06/22/2014   EF was 15% but now 35-40% after PCI  . Diabetes mellitus, type II ([place])   . Hyperlipidemia   . Hypertension   . Subarachnoid hemorrhage ([place]) 1998   from HTN    Current Outpatient Prescriptions on File Prior to Visit  Medication Sig Dispense Refill  . aspirin 81 MG tablet Take 81 mg by mouth daily.      . carvedilol (COREG) 12.5 MG tablet Take 1.5 tablets (18.75 mg total) by mouth 2 (two) times daily. 90 tablet 11  . clopidogrel (PLAVIX) 75 MG tablet Take 1 tablet (75 mg total) by mouth daily. 90 tablet 3  . furosemide (LASIX) 40 MG tablet Take 40 mg by mouth daily.  0    . hydrALAZINE (APRESOLINE) 25 MG tablet take 1 tablet by mouth three times a day 90 tablet 11  . isosorbide mononitrate (IMDUR) 30 MG 24 hr tablet Take 1 tablet (30 mg total) by mouth daily. 30 tablet 10  . lisinopril (PRINIVIL,ZESTRIL) 40 MG tablet Take 1 tablet by mouth every day 30 tablet 11  . metFORMIN (GLUCOPHAGE) 1000 MG tablet take 1 tablet by mouth twice a day with food 180 tablet 1  . Multiple Vitamins-Minerals (MULTIVITAMIN WITH MINERALS) tablet Take 1 tablet by mouth daily.      . nitroGLYCERIN (NITROSTAT) 0.4 MG SL tablet Place 0.4 mg under the tongue every 5 (five) minutes as needed for chest pain (x 3 tabs).    . rosuvastatin (CRESTOR) 40 MG tablet take 1 tablet by mouth once daily 30 tablet 11  . spironolactone (ALDACTONE) 25 MG tablet Take 0.5 tablets (12.5 mg total) by mouth daily. 30 tablet 11  . tetrahydrozoline-zinc (VISINE-AC) 0.05-0.25 % ophthalmic solution Place 1 drop into both eyes daily as needed (allergy irritation.).    Marland Kitchen ZETIA 10 MG tablet take 1 tablet by mouth once daily 30 tablet 11   Current Facility-Administered Medications on File Prior to Visit  Medication Dose Route Frequency Provider Last Rate Last Dose  . 0.9 %  sodium chloride infusion  500 mL Intravenous Continuous Ladene Artist, MD        No Known Allergies   Assessment/Plan:  1. Hypertension/HF med optimization: BP has dropped to 108/20mmHg and pt reports feeling wonderful. Will continue current medications. Previous BP usually 454-098 systolic but do see a pattern where he dips to the low 100s every so often. Will f/u in 6 weeks in clinic to see if BP allows up titration of carvedilol.   Raynald Rouillard E. Kristilyn Coltrane, PharmD, CPP, [facility] [address] [place], [place] [address] Phone: [phone]; Fax: [phone] 07/03/2016 11:54 AM

## 2016-08-14 ENCOUNTER — Ambulatory Visit (INDEPENDENT_AMBULATORY_CARE_PROVIDER_SITE_OTHER): Payer: Medicare HMO | Admitting: Pharmacist

## 2016-08-14 VITALS — BP 118/78 | HR 68

## 2016-08-14 DIAGNOSIS — I10 Essential (primary) hypertension: Secondary | ICD-10-CM

## 2016-08-14 DIAGNOSIS — I5022 Chronic systolic (congestive) heart failure: Secondary | ICD-10-CM

## 2016-08-14 MED ORDER — CARVEDILOL 12.5 MG PO TABS: 18.7500 mg | ORAL_TABLET | Freq: Two times a day (BID) | ORAL | 1 refills | Status: DC

## 2016-08-14 NOTE — Progress Notes (Signed)
Patient ID: Jesse Woods                 DOB: [birth year]                      MRN: [id number]     HPI: Jesse Woods is a 66 y.o. male patient of Dr. Carlye Grippe HTN clinicfor HF medication optimization who presents today for follow up.PMH is significant for HTN, HLD, DM2, remote history of subarachnoid hemorrhage, CAD and ischemic cardiomyopathy. Cath in February 2016 showed chronic total occlusion of the mid L Cx and severely diseased RCA. He had a LHC in September 2016 and mid RCA was treated with DES. Echo in August 2016 showed LVEF improved to 35-40% from 15% previously in February 2016. He was switched to [facility] but his copay was too expensive and he was switched back to ACEi. At his most recent visit in HTN clinic, his pressure was borderline low and his current regimen was continued. He presents today to determine if BP will tolerate up titration of BB.   Pt presents today feeling great. No complaints. Denied and dizziness, lightheadedness, SOB, orthostatic signs or symptoms, fatigue, syncope, falls or issues obtaining medications. Pt stated that he was happy with how his medications are working and is excited that he is feeling like his normal self.  Current HTN meds: carvedilol 12.5mg  BID, lisinopril 40mg  daily, spironolactone 12.5mg  daily, Imdur 30mg  daily, hydralazine 25mg  TID, furosemide 40mg  daily Previously tried:Entresto - too expensive BP goal: <130/31mmHg  Family History: The patient's family history includes Cancer in his brother, cousin, father, and mother; Diabetes in his father; Hypertension in his father; Pancreatic cancer in his mother; Prostate cancer in his father and paternal uncle.  Social History: Denies tobacco, alcohol, and illicit drug use.  Diet:Avoids adding salt to his food. Weighs himself almost daily.  Exercise:Wants to start walking again. Used to walk 1-2x per day for 1 mile each but hasn't done this recently due to the weather.  Home BP  readings:Does not check his BP at home.  Wt Readings from Last 3 Encounters:  06/06/16 201 lb (91.2 kg)  05/30/16 203 lb 6.4 oz (92.3 kg)  05/29/16 201 lb (91.2 kg)   BP Readings from Last 3 Encounters:  07/03/16 108/68  06/13/16 130/82  06/06/16 121/81   Pulse Readings from Last 3 Encounters:  07/03/16 76  06/13/16 67  06/06/16 75    Renal function: CrCl cannot be calculated (Patient's most recent lab result is older than the maximum 21 days allowed.).  Past Medical History:  Diagnosis Date  . Arthritis    "back and left hip" (12/22/2014)  . Chronic systolic CHF (congestive heart failure), NYHA class 2 ([place])   . Coronary artery disease 01/2015   chronic total occlusion of mid LCx and severely diseased RCA on previous cath in February 2016. EF at that time was 15%.He was cleared by neurosurgery for DAPT and then wasseen by Dr. Irish Lack to proceed with PCI of RCA. LHC 12/22/2014 showed 90% proximal to mid RCA lesion that treated with 3.0 x 32 mm Synergy DES  . DCM (dilated cardiomyopathy) ([place]) 06/22/2014   EF was 15% but now 35-40% after PCI  . Diabetes mellitus, type II ([place])   . Hyperlipidemia   . Hypertension   . Subarachnoid hemorrhage ([place]) 1998   from HTN    Current Outpatient Prescriptions on File Prior to Visit  Medication Sig Dispense Refill  . aspirin 81  MG tablet Take 81 mg by mouth daily.      . carvedilol (COREG) 12.5 MG tablet Take 1 tablet (12.5 mg total) by mouth 2 (two) times daily.    . clopidogrel (PLAVIX) 75 MG tablet Take 1 tablet (75 mg total) by mouth daily. 90 tablet 3  . furosemide (LASIX) 40 MG tablet Take 40 mg by mouth daily.  0  . hydrALAZINE (APRESOLINE) 25 MG tablet take 1 tablet by mouth three times a day 90 tablet 11  . isosorbide mononitrate (IMDUR) 30 MG 24 hr tablet Take 1 tablet (30 mg total) by mouth daily. 30 tablet 10  . lisinopril (PRINIVIL,ZESTRIL) 40 MG tablet Take 1 tablet by mouth every day 30 tablet 11  . metFORMIN  (GLUCOPHAGE) 1000 MG tablet take 1 tablet by mouth twice a day with food 180 tablet 1  . Multiple Vitamins-Minerals (MULTIVITAMIN WITH MINERALS) tablet Take 1 tablet by mouth daily.      . nitroGLYCERIN (NITROSTAT) 0.4 MG SL tablet Place 0.4 mg under the tongue every 5 (five) minutes as needed for chest pain (x 3 tabs).    . rosuvastatin (CRESTOR) 40 MG tablet take 1 tablet by mouth once daily 30 tablet 11  . spironolactone (ALDACTONE) 25 MG tablet Take 0.5 tablets (12.5 mg total) by mouth daily. 30 tablet 11  . tetrahydrozoline-zinc (VISINE-AC) 0.05-0.25 % ophthalmic solution Place 1 drop into both eyes daily as needed (allergy irritation.).    Marland Kitchen ZETIA 10 MG tablet take 1 tablet by mouth once daily 30 tablet 11   Current Facility-Administered Medications on File Prior to Visit  Medication Dose Route Frequency Provider Last Rate Last Dose  . 0.9 %  sodium chloride infusion  500 mL Intravenous Continuous Ladene Artist, MD        No Known Allergies  There were no vitals taken for this visit.   Assessment/Plan: Hypertension/HF med optimization: Patient currently on guideline recommended medications for HF. There is still room for medication optimization/titration but recently blood pressure has been a limiting factor. Patient has no hypotensive signs or symptoms, or complaints otherwise. Stated that he is tolerating medications well. Will increase carvedilol dose to 18.75mg  (1 and 1/2 tablets) twice daily. Instructed patient to call with any issues. Follow up in 4 weeks and consider further carvedilol titration to goal dose of 25mg  BID.  Cruz Condon, PharmD, [place] PGY2 Pharmacy Resident  Lelan Pons. Patterson [place], [facility]  08/14/2016 7:45 AM

## 2016-08-14 NOTE — Patient Instructions (Signed)
It was great seeing you today!  Please increase your carvedilol dose to 18.75mg  (1 and 1/2 tablets) twice daily.  Continue to take the rest of your medications as you have.

## 2016-09-11 ENCOUNTER — Ambulatory Visit: Payer: Medicare HMO | Admitting: Pharmacist

## 2016-09-11 NOTE — Progress Notes (Deleted)
Patient ID: Jesse Woods                 DOB: [birth year]                      MRN: [id number]     HPI: Jesse Woods is a very pleasant 66 y.o. male referred by Dr. Radford Pax to HTN clinic for HF medication optimization. PMH is significant for HTN, HLD, DM2, remote history of subarachnoid hemorrhage, CAD and ischemic cardiomyopathy. Cath in February 2016 showed chronic total occlusion of the mid L Cx and severely diseased RCA. He had a LHC in September 2016 and mid RCA was treated with DES. Echo in August 2016 showed LVEF improved to 35-40% from 15% previously in February 2016. At his last HTN clinic visit on 5/9, carvedilol dose was increased to 18.75 mg bid. Most recent Bmet was 3/28, Scr was 1.30 (at baseline) and electrolytes were within normal limits.  Patient presents today for possible titration of carvedilol to target dose if BP allows. He reports tolerating medications ***, and has *** increased his carvedilol dose. He *** dizziness, headache, and blurred vision. Reports*** adherence with his medications. He does *** check his BP at home.   Current HTN meds:  Carvedilol 18.75*** mg bid Furosemide 40 mg daily *** Hydralazine 25 mg tid Isosorbide mononitrate 30 mg daily  Lisinopril 40 mg daily Spironolactone 12.5 mg daily  Previously tried: Ship broker (too expensive)  BP goal: < 130/80 mmHg  Family History: The patient's family history includes Cancer in his brother, cousin, father, and mother; Diabetes in his father; Hypertension in his father; Pancreatic cancer in his mother; Prostate cancer in his father and paternal uncle.  Social History: Denies tobacco, alcohol, and illicit drug use.  Diet: Avoids adding salt to his food. Weighs himself almost daily. ***  Exercise:  Wants to start walking again. Used to walk 1-2x per day for 1 mile each but hasn't done this recently due to the weather. ***  Home BP readings:   Wt Readings from Last 3 Encounters:  06/06/16 201 lb (91.2  kg)  05/30/16 203 lb 6.4 oz (92.3 kg)  05/29/16 201 lb (91.2 kg)   BP Readings from Last 3 Encounters:  08/14/16 118/78  07/03/16 108/68  06/13/16 130/82   Pulse Readings from Last 3 Encounters:  08/14/16 68  07/03/16 76  06/13/16 67    Renal function: CrCl cannot be calculated (Patient's most recent lab result is older than the maximum 21 days allowed.).  Past Medical History:  Diagnosis Date  . Arthritis    "back and left hip" (12/22/2014)  . Chronic systolic CHF (congestive heart failure), NYHA class 2 ([place])   . Coronary artery disease 01/2015   chronic total occlusion of mid LCx and severely diseased RCA on previous cath in February 2016. EF at that time was 15%.He was cleared by neurosurgery for DAPT and then wasseen by Dr. Irish Lack to proceed with PCI of RCA. LHC 12/22/2014 showed 90% proximal to mid RCA lesion that treated with 3.0 x 32 mm Synergy DES  . DCM (dilated cardiomyopathy) ([place]) 06/22/2014   EF was 15% but now 35-40% after PCI  . Diabetes mellitus, type II ([place])   . Hyperlipidemia   . Hypertension   . Subarachnoid hemorrhage ([place]) 1998   from HTN    Current Outpatient Prescriptions on File Prior to Visit  Medication Sig Dispense Refill  . aspirin 81 MG tablet Take 81 mg  by mouth daily.      . carvedilol (COREG) 12.5 MG tablet Take 1.5 tablets (18.75 mg total) by mouth 2 (two) times daily. 90 tablet 1  . clopidogrel (PLAVIX) 75 MG tablet Take 1 tablet (75 mg total) by mouth daily. 90 tablet 3  . furosemide (LASIX) 40 MG tablet Take 40 mg by mouth daily.  0  . hydrALAZINE (APRESOLINE) 25 MG tablet take 1 tablet by mouth three times a day 90 tablet 11  . isosorbide mononitrate (IMDUR) 30 MG 24 hr tablet Take 1 tablet (30 mg total) by mouth daily. 30 tablet 10  . lisinopril (PRINIVIL,ZESTRIL) 40 MG tablet Take 1 tablet by mouth every day 30 tablet 11  . metFORMIN (GLUCOPHAGE) 1000 MG tablet take 1 tablet by mouth twice a day with food 180 tablet 1  . Multiple  Vitamins-Minerals (MULTIVITAMIN WITH MINERALS) tablet Take 1 tablet by mouth daily.      . nitroGLYCERIN (NITROSTAT) 0.4 MG SL tablet Place 0.4 mg under the tongue every 5 (five) minutes as needed for chest pain (x 3 tabs).    . rosuvastatin (CRESTOR) 40 MG tablet take 1 tablet by mouth once daily 30 tablet 11  . spironolactone (ALDACTONE) 25 MG tablet Take 0.5 tablets (12.5 mg total) by mouth daily. 30 tablet 11  . tetrahydrozoline-zinc (VISINE-AC) 0.05-0.25 % ophthalmic solution Place 1 drop into both eyes daily as needed (allergy irritation.).    Marland Kitchen ZETIA 10 MG tablet take 1 tablet by mouth once daily 30 tablet 11   Current Facility-Administered Medications on File Prior to Visit  Medication Dose Route Frequency Provider Last Rate Last Dose  . 0.9 %  sodium chloride infusion  500 mL Intravenous Continuous Ladene Artist, MD        No Known Allergies   Assessment/Plan:  1. Hypertension/HF optimization - Patient is currently on guideline recommended medications for HF. ***, BP does*** allow for further dose optimization. Will ***, and continue other medications as prescribed. Follow-up in *** weeks.   (Could consider inc to carvedilol, hydralazine, Imdur, or spironolactone)  Belia Heman, PharmD PGY1 Resident 09/11/2016 8:22 AM  Patient seen with: Fuller Canada, PharmD, CPP, [facility] [address] [place], [place] [address] Phone: [phone]; Fax: [phone]

## 2016-09-18 ENCOUNTER — Encounter: Payer: Self-pay | Admitting: Internal Medicine

## 2016-09-18 ENCOUNTER — Ambulatory Visit (INDEPENDENT_AMBULATORY_CARE_PROVIDER_SITE_OTHER): Payer: Medicare HMO | Admitting: Internal Medicine

## 2016-09-18 ENCOUNTER — Other Ambulatory Visit (INDEPENDENT_AMBULATORY_CARE_PROVIDER_SITE_OTHER): Payer: Medicare HMO

## 2016-09-18 ENCOUNTER — Other Ambulatory Visit: Payer: Self-pay | Admitting: Internal Medicine

## 2016-09-18 VITALS — BP 102/64 | HR 80 | Ht 68.0 in | Wt 194.0 lb

## 2016-09-18 DIAGNOSIS — E1159 Type 2 diabetes mellitus with other circulatory complications: Secondary | ICD-10-CM | POA: Diagnosis not present

## 2016-09-18 DIAGNOSIS — I10 Essential (primary) hypertension: Secondary | ICD-10-CM | POA: Diagnosis not present

## 2016-09-18 DIAGNOSIS — E785 Hyperlipidemia, unspecified: Secondary | ICD-10-CM | POA: Diagnosis not present

## 2016-09-18 DIAGNOSIS — Z0000 Encounter for general adult medical examination without abnormal findings: Secondary | ICD-10-CM

## 2016-09-18 DIAGNOSIS — N179 Acute kidney failure, unspecified: Secondary | ICD-10-CM

## 2016-09-18 LAB — HEMOGLOBIN A1C: HEMOGLOBIN A1C: 6 % (ref 4.6–6.5)

## 2016-09-18 LAB — LIPID PANEL
Cholesterol: 75 mg/dL (ref 0–200)
HDL: 21.9 mg/dL — ABNORMAL LOW (ref >39.00)
LDL Cholesterol: 32 mg/dL (ref 0–99)
NONHDL: 53.49
Total CHOL/HDL Ratio: 3
Triglycerides: 106 mg/dL (ref 0.0–149.0)
VLDL: 21.2 mg/dL (ref 0.0–40.0)

## 2016-09-18 LAB — BASIC METABOLIC PANEL
BUN: 39 mg/dL — ABNORMAL HIGH (ref 6–23)
CALCIUM: 9.7 mg/dL (ref 8.4–10.5)
CHLORIDE: 107 meq/L (ref 96–112)
CO2: 26 meq/L (ref 19–32)
CREATININE: 1.95 mg/dL — AB (ref 0.40–1.50)
GFR: 44.25 mL/min — ABNORMAL LOW (ref >60.00)
Glucose, Bld: 119 mg/dL — ABNORMAL HIGH (ref 70–99)
Potassium: 5.5 mEq/L — ABNORMAL HIGH (ref 3.5–5.1)
SODIUM: 138 meq/L (ref 135–145)

## 2016-09-18 LAB — HEPATIC FUNCTION PANEL
ALK PHOS: 38 U/L — AB (ref 39–117)
ALT: 39 U/L (ref 0–53)
AST: 34 U/L (ref 0–37)
Albumin: 4.6 g/dL (ref 3.5–5.2)
Bilirubin, Direct: 0.2 mg/dL (ref 0.0–0.3)
TOTAL PROTEIN: 7.6 g/dL (ref 6.0–8.3)
Total Bilirubin: 0.6 mg/dL (ref 0.2–1.2)

## 2016-09-18 NOTE — Progress Notes (Signed)
Subjective:    Patient ID: Jesse Woods, male    DOB: 1950-09-07, 66 y.o.   MRN: [id number]  HPI  Here to f/u; overall doing ok,  Pt denies chest pain, increasing sob or doe, wheezing, orthopnea, PND, increased LE swelling, palpitations, dizziness or syncope.  Pt denies new neurological symptoms such as new headache, or facial or extremity weakness or numbness.  Pt denies polydipsia, polyuria, or low sugar episode.   Pt denies new neurological symptoms such as new headache, or facial or extremity weakness or numbness.   Pt states overall good compliance with meds, mostly trying to follow appropriate diet, with wt overall stable,  but little exercise however. No new complaints BP Readings from Last 3 Encounters:  09/18/16 102/64  08/14/16 118/78  07/03/16 108/68   Wt Readings from Last 3 Encounters:  09/18/16 194 lb (88 kg)  06/06/16 201 lb (91.2 kg)  05/30/16 203 lb 6.4 oz (92.3 kg)   Past Medical History:  Diagnosis Date  . Arthritis    "back and left hip" (12/22/2014)  . Chronic systolic CHF (congestive heart failure), NYHA class 2 ([place])   . Coronary artery disease 01/2015   chronic total occlusion of mid LCx and severely diseased RCA on previous cath in February 2016. EF at that time was 15%.He was cleared by neurosurgery for DAPT and then wasseen by Dr. Irish Lack to proceed with PCI of RCA. LHC 12/22/2014 showed 90% proximal to mid RCA lesion that treated with 3.0 x 32 mm Synergy DES  . DCM (dilated cardiomyopathy) ([place]) 06/22/2014   EF was 15% but now 35-40% after PCI  . Diabetes mellitus, type II ([place])   . Hyperlipidemia   . Hypertension   . Subarachnoid hemorrhage ([place]) 1998   from HTN   Past Surgical History:  Procedure Laterality Date  . BRAIN SURGERY  1998   right frontal ventriculotomy with subsequent ventriculo-abfdominal shunt due to [facility]  . CARDIAC CATHETERIZATION N/A 12/22/2014   Procedure: Coronary Stent Intervention;  Surgeon: Jettie Booze, MD;   Location: [facility] CV [facility];  Service: Cardiovascular;  Laterality: N/A;  . CARDIAC CATHETERIZATION  12/22/2014   Procedure: Left Heart Cath and Coronary Angiography;  Surgeon: Jettie Booze, MD;  Location: [facility];  Service: Cardiovascular;;  . CARDIAC CATHETERIZATION  06/24/2014  . CORONARY ANGIOPLASTY    . CSF SHUNT  1998  . INGUINAL HERNIA REPAIR Right 1954  . LEFT HEART CATHETERIZATION WITH CORONARY ANGIOGRAM N/A 06/24/2014   Procedure: LEFT HEART CATHETERIZATION WITH CORONARY ANGIOGRAM;  Surgeon: Jettie Booze, MD;  Location: [facility] CATH LAB;  Service: Cardiovascular;  Laterality: N/A;    reports that he has never smoked. He has never used smokeless tobacco. He reports that he drinks alcohol. He reports that he does not use drugs. family history includes Cancer in his brother, cousin, father, and mother; Diabetes in his father; Hypertension in his father; Pancreatic cancer in his mother; Prostate cancer in his father and paternal uncle. No Known Allergies Current Outpatient Prescriptions on File Prior to Visit  Medication Sig Dispense Refill  . aspirin 81 MG tablet Take 81 mg by mouth daily.      . carvedilol (COREG) 12.5 MG tablet Take 1.5 tablets (18.75 mg total) by mouth 2 (two) times daily. 90 tablet 1  . clopidogrel (PLAVIX) 75 MG tablet Take 1 tablet (75 mg total) by mouth daily. 90 tablet 3  . furosemide (LASIX) 40 MG tablet Take 40 mg by mouth  daily.  0  . hydrALAZINE (APRESOLINE) 25 MG tablet take 1 tablet by mouth three times a day 90 tablet 11  . isosorbide mononitrate (IMDUR) 30 MG 24 hr tablet Take 1 tablet (30 mg total) by mouth daily. 30 tablet 10  . lisinopril (PRINIVIL,ZESTRIL) 40 MG tablet Take 1 tablet by mouth every day 30 tablet 11  . metFORMIN (GLUCOPHAGE) 1000 MG tablet take 1 tablet by mouth twice a day with food 180 tablet 1  . Multiple Vitamins-Minerals (MULTIVITAMIN WITH MINERALS) tablet Take 1 tablet by mouth daily.      . nitroGLYCERIN  (NITROSTAT) 0.4 MG SL tablet Place 0.4 mg under the tongue every 5 (five) minutes as needed for chest pain (x 3 tabs).    . rosuvastatin (CRESTOR) 40 MG tablet take 1 tablet by mouth once daily 30 tablet 11  . spironolactone (ALDACTONE) 25 MG tablet Take 0.5 tablets (12.5 mg total) by mouth daily. 30 tablet 11  . tetrahydrozoline-zinc (VISINE-AC) 0.05-0.25 % ophthalmic solution Place 1 drop into both eyes daily as needed (allergy irritation.).    Marland Kitchen ZETIA 10 MG tablet take 1 tablet by mouth once daily 30 tablet 11   Current Facility-Administered Medications on File Prior to Visit  Medication Dose Route Frequency Provider Last Rate Last Dose  . 0.9 %  sodium chloride infusion  500 mL Intravenous Continuous Ladene Artist, MD       Review of Systems  Constitutional: Negative for other unusual diaphoresis or sweats HENT: Negative for ear discharge or swelling Eyes: Negative for other worsening visual disturbances Respiratory: Negative for stridor or other swelling  Gastrointestinal: Negative for worsening distension or other blood Genitourinary: Negative for retention or other urinary change Musculoskeletal: Negative for other MSK pain or swelling Skin: Negative for color change or other new lesions Neurological: Negative for worsening tremors and other numbness  Psychiatric/Behavioral: Negative for worsening agitation or other fatigue' All otherwise neg per pt    Objective:   Physical Exam BP 102/64   Pulse 80   Ht 5\' 8"  (1.727 m)   Wt 194 lb (88 kg)   SpO2 98%   BMI 29.50 kg/m  VS noted,  Constitutional: Pt appears in NAD HENT: Head: NCAT.  Right Ear: External ear normal.  Left Ear: External ear normal.  Eyes: . Pupils are equal, round, and reactive to light. Conjunctivae and EOM are normal Nose: without d/c or deformity Neck: Neck supple. Gross normal ROM Cardiovascular: Normal rate and regular rhythm.   Pulmonary/Chest: Effort normal and breath sounds without rales or  wheezing.  Neurological: Pt is alert. At baseline orientation, motor grossly intact Skin: Skin is warm. No rashes, other new lesions, no LE edema Psychiatric: Pt behavior is normal without agitation  No other exam findings  . Lab Results  Component Value Date   WBC 9.7 03/20/2016   HGB 12.8 (L) 03/20/2016   HCT 38.7 (L) 03/20/2016   PLT 266.0 03/20/2016   GLUCOSE 146 (H) 07/03/2016   CHOL 105 03/20/2016   TRIG 141.0 03/20/2016   HDL 29.10 (L) 03/20/2016   LDLDIRECT 215.3 01/03/2011   LDLCALC 48 03/20/2016   ALT 24 03/20/2016   AST 20 03/20/2016   NA 140 07/03/2016   K 4.7 07/03/2016   CL 101 07/03/2016   CREATININE 1.30 (H) 07/03/2016   BUN 18 07/03/2016   CO2 22 07/03/2016   TSH 1.00 03/20/2016   PSA 1.28 03/20/2016   INR 1.1 (H) 12/19/2014   HGBA1C 6.1 03/20/2016  MICROALBUR 4.1 (H) 03/20/2016       Assessment & Plan:

## 2016-09-18 NOTE — Assessment & Plan Note (Signed)
stable overall by history and exam, recent data reviewed with pt, and pt to continue medical treatment as before,  to f/u any worsening symptoms or concerns Lab Results  Component Value Date   HGBA1C 6.1 03/20/2016

## 2016-09-18 NOTE — Assessment & Plan Note (Signed)
stable overall by history and exam, recent data reviewed with pt, and pt to continue medical treatment as before,  to f/u any worsening symptoms or concerns Lab Results  Component Value Date   LDLCALC 48 03/20/2016

## 2016-09-18 NOTE — Assessment & Plan Note (Signed)
stable overall by history and exam, recent data reviewed with pt, and pt to continue medical treatment as before,  to f/u any worsening symptoms or concerns  

## 2016-09-18 NOTE — Patient Instructions (Signed)

## 2016-09-19 ENCOUNTER — Telehealth: Payer: Self-pay

## 2016-09-19 NOTE — Telephone Encounter (Signed)
Called pt, LVM.   

## 2016-09-19 NOTE — Telephone Encounter (Signed)
-----   Message from Biagio Borg, MD sent at 09/18/2016  6:49 PM EDT ----- Left message on MyChart, pt to cont same tx except  The test results show that your current treatment is OK, except the kidney function has worsened.  This may be due to taking the 2 fluid pills.  Please HOLD on taking the lasix and aldactone, drink more fluids in the next few days, and then return for repeat blood test for the kidney on Monday June 18 or Tuesday June 19.       Shirron to please inform pt, I will do lab order

## 2016-09-25 ENCOUNTER — Ambulatory Visit (INDEPENDENT_AMBULATORY_CARE_PROVIDER_SITE_OTHER): Payer: Medicare HMO | Admitting: Pharmacist

## 2016-09-25 VITALS — BP 88/53 | HR 68 | Wt 188.0 lb

## 2016-09-25 DIAGNOSIS — I10 Essential (primary) hypertension: Secondary | ICD-10-CM

## 2016-09-25 DIAGNOSIS — I5022 Chronic systolic (congestive) heart failure: Secondary | ICD-10-CM | POA: Diagnosis not present

## 2016-09-25 NOTE — Progress Notes (Signed)
Patient ID: Jesse Woods                 DOB: Jun 09, 1950                      MRN: [id number]     HPI: Jesse Woods is a 66 y.o. male patient of Dr. Carlye Grippe HTN clinicfor HF medication optimizationwho presents today for follow up.PMH is significant for HTN, HLD, DM2, remote history of subarachnoid hemorrhage, CAD and ischemic cardiomyopathy. Cath in February 2016 showed chronic total occlusion of the mid L Cx and severely diseased RCA. He had a LHC in September 2016 and mid RCA was treated with DES. Echo in August 2016 showed LVEF improved to 35-40% from 15% previously in February 2016. He was switched to [facility] but his copay was too expensive and he was switched back to ACEi. At his most recent visit in HTN clinic, carvedilol was increased to 18.75mg  BID.  Pt reports feeling very well. He denies dizziness, blurred vision, orthostasis, headaches, or falls. He does not check his BP at home but did have a visit with his PCP 1 week ago - BP was 102/64. He reports compliance with all medications.   BP reading checked in clinic today with both the manual and automatic cuff in each arm - BP in the 80s/50-60s each time. Pt is asymptomatic.  Current HTN meds: carvedilol 18.75mg  BID, lisinopril 40mg  daily, spironolactone 12.5mg  daily, Imdur 30mg  daily, hydralazine 25mg  TID, furosemide 40mg  daily Previously tried:Entresto - too expensive BP goal: <130/15mmHg  Family History: The patient's family history includes Cancer in his brother, cousin, father, and mother; Diabetes in his father; Hypertension in his father; Pancreatic cancer in his mother; Prostate cancer in his father and paternal uncle.  Social History: Denies tobacco, alcohol, and illicit drug use.  Diet:Avoids adding salt to his food. Weighs himself almost daily.  Exercise:Wants to start walking again. Used to walk 1-2x per day for 1 mile each but hasn't done this recently due to the weather.  Home BP readings:Does  not check his BP at home.  Wt Readings from Last 3 Encounters:  09/18/16 194 lb (88 kg)  06/06/16 201 lb (91.2 kg)  05/30/16 203 lb 6.4 oz (92.3 kg)   BP Readings from Last 3 Encounters:  09/18/16 102/64  08/14/16 118/78  07/03/16 108/68   Pulse Readings from Last 3 Encounters:  09/18/16 80  08/14/16 68  07/03/16 76    Renal function: Estimated Creatinine Clearance: 39.6 mL/min (A) (by C-G formula based on SCr of 1.95 mg/dL (H)).  Past Medical History:  Diagnosis Date  . Arthritis    "back and left hip" (12/22/2014)  . Chronic systolic CHF (congestive heart failure), NYHA class 2 ([place])   . Coronary artery disease 01/2015   chronic total occlusion of mid LCx and severely diseased RCA on previous cath in February 2016. EF at that time was 15%.He was cleared by neurosurgery for DAPT and then wasseen by Dr. Irish Lack to proceed with PCI of RCA. LHC 12/22/2014 showed 90% proximal to mid RCA lesion that treated with 3.0 x 32 mm Synergy DES  . DCM (dilated cardiomyopathy) ([place]) 06/22/2014   EF was 15% but now 35-40% after PCI  . Diabetes mellitus, type II ([place])   . Hyperlipidemia   . Hypertension   . Subarachnoid hemorrhage ([place]) 1998   from HTN    Current Outpatient Prescriptions on File Prior to Visit  Medication Sig Dispense Refill  .  aspirin 81 MG tablet Take 81 mg by mouth daily.      . carvedilol (COREG) 12.5 MG tablet Take 1.5 tablets (18.75 mg total) by mouth 2 (two) times daily. 90 tablet 1  . clopidogrel (PLAVIX) 75 MG tablet Take 1 tablet (75 mg total) by mouth daily. 90 tablet 3  . furosemide (LASIX) 40 MG tablet Take 40 mg by mouth daily.  0  . hydrALAZINE (APRESOLINE) 25 MG tablet take 1 tablet by mouth three times a day 90 tablet 11  . isosorbide mononitrate (IMDUR) 30 MG 24 hr tablet Take 1 tablet (30 mg total) by mouth daily. 30 tablet 10  . lisinopril (PRINIVIL,ZESTRIL) 40 MG tablet Take 1 tablet by mouth every day 30 tablet 11  . metFORMIN (GLUCOPHAGE) 1000  MG tablet take 1 tablet by mouth twice a day with food 180 tablet 1  . Multiple Vitamins-Minerals (MULTIVITAMIN WITH MINERALS) tablet Take 1 tablet by mouth daily.      . nitroGLYCERIN (NITROSTAT) 0.4 MG SL tablet Place 0.4 mg under the tongue every 5 (five) minutes as needed for chest pain (x 3 tabs).    . rosuvastatin (CRESTOR) 40 MG tablet take 1 tablet by mouth once daily 30 tablet 11  . spironolactone (ALDACTONE) 25 MG tablet Take 0.5 tablets (12.5 mg total) by mouth daily. 30 tablet 11  . tetrahydrozoline-zinc (VISINE-AC) 0.05-0.25 % ophthalmic solution Place 1 drop into both eyes daily as needed (allergy irritation.).    Marland Kitchen ZETIA 10 MG tablet take 1 tablet by mouth once daily 30 tablet 11   Current Facility-Administered Medications on File Prior to Visit  Medication Dose Route Frequency Provider Last Rate Last Dose  . 0.9 %  sodium chloride infusion  500 mL Intravenous Continuous Ladene Artist, MD        No Known Allergies   Assessment/Plan:  1. HF medication optimization - BP has dropped to 88/53 today confirmed with repeat BP checks. He is feeling very well overall and denies any symptoms or adverse medication effects. Will decrease hydralazine to 12.5mg  TID and continue all other optimized HF medications. Close f/u in HTN clinic in 2 weeks given low BP reading today.   [name], PharmD, CPP, [facility] [address] [place], [place] [address] Phone: [phone]; Fax: [phone] 09/25/2016 3:10 PM

## 2016-09-25 NOTE — Patient Instructions (Addendum)
It was nice to see you today  Please cut your hydralazine in half and start taking 1/2 tablet (12.5mg ) 3 times a day  Continue taking your other medication  Have your blood pressure checked at the pharmacy a few times in the next 2 weeks.   I would like to see your top blood pressure number (systolic reading) at least 100  Follow up in 2 weeks in clinic

## 2016-10-08 ENCOUNTER — Ambulatory Visit: Payer: Medicare HMO

## 2016-10-22 ENCOUNTER — Ambulatory Visit (INDEPENDENT_AMBULATORY_CARE_PROVIDER_SITE_OTHER): Payer: Medicare HMO | Admitting: Pharmacist Clinician (PhC)/ Clinical Pharmacy Specialist

## 2016-10-22 DIAGNOSIS — I10 Essential (primary) hypertension: Secondary | ICD-10-CM | POA: Diagnosis not present

## 2016-10-22 NOTE — Progress Notes (Signed)
Patient ID: Jesse Woods                 DOB: May 14, 1950                      MRN: [id number]     HPI: Jesse Woods is a 66 y.o. male patient of Dr. Carlye Grippe HTN clinicfor HF medication optimizationwho presents today for follow up.PMH is significant for HTN, HLD, DM2, remote history of subarachnoid hemorrhage, CAD and ischemic cardiomyopathy. Cath in February 2016 showed chronic total occlusion of the mid L Cx and severely diseased RCA. He had a LHC in September 2016 and mid RCA was treated with DES. Echo in August 2016 showed LVEF improved to 35-40% from 15% previously in February 2016. He was switched to [facility] but his copay was too expensive and he was switched back to ACEi. At his most recent visit in HTN clinic, carvedilol was increased to 18.75mg  BID.  Pt reports feeling very well. He denies dizziness, blurred vision, orthostasis, headaches, or falls. He does not check his BP at home but did have a visit with his PCP 1 week ago - BP was 102/70. He reports compliance with all medications.   Current HTN meds: carvedilol 18.75mg  BID, lisinopril 40mg  daily (am), spironolactone 12.5mg  daily (pm), Imdur 30mg  daily (am), hydralazine 12.5mg  TID, furosemide 40mg  daily (am)  Previously tried:Entresto - too expensive BP goal: <130/22mmHg  Family History: The patient's family history includes Cancer in his brother, cousin, father, and mother; Diabetes in his father; Hypertension in his father; Pancreatic cancer in his mother; Prostate cancer in his father and paternal uncle.  Social History: Denies tobacco, alcohol, and illicit drug use; tries to avoid all caffeine.  Diet:Avoids adding salt to his food. Weighs himself almost daily.  Exercise:walking 1-2 miles per day  Home BP readings:Does not check his BP at home.  Wt Readings from Last 3 Encounters:  09/25/16 188 lb (85.3 kg)  09/18/16 194 lb (88 kg)  06/06/16 201 lb (91.2 kg)   BP Readings from Last 3 Encounters:    09/25/16 (!) 88/53  09/18/16 102/64  08/14/16 118/78   Pulse Readings from Last 3 Encounters:  09/25/16 68  09/18/16 80  08/14/16 68    Renal function: CrCl cannot be calculated (Patient's most recent lab result is older than the maximum 21 days allowed.).  Past Medical History:  Diagnosis Date  . Arthritis    "back and left hip" (12/22/2014)  . Chronic systolic CHF (congestive heart failure), NYHA class 2 ([place])   . Coronary artery disease 01/2015   chronic total occlusion of mid LCx and severely diseased RCA on previous cath in February 2016. EF at that time was 15%.He was cleared by neurosurgery for DAPT and then wasseen by Dr. Irish Lack to proceed with PCI of RCA. LHC 12/22/2014 showed 90% proximal to mid RCA lesion that treated with 3.0 x 32 mm Synergy DES  . DCM (dilated cardiomyopathy) ([place]) 06/22/2014   EF was 15% but now 35-40% after PCI  . Diabetes mellitus, type II ([place])   . Hyperlipidemia   . Hypertension   . Subarachnoid hemorrhage ([place]) 1998   from HTN    Current Outpatient Prescriptions on File Prior to Visit  Medication Sig Dispense Refill  . aspirin 81 MG tablet Take 81 mg by mouth daily.      . carvedilol (COREG) 12.5 MG tablet Take 1.5 tablets (18.75 mg total) by mouth 2 (two) times daily.  90 tablet 1  . clopidogrel (PLAVIX) 75 MG tablet Take 1 tablet (75 mg total) by mouth daily. 90 tablet 3  . furosemide (LASIX) 40 MG tablet Take 40 mg by mouth daily.  0  . hydrALAZINE (APRESOLINE) 25 MG tablet Take 0.5 tablets (12.5 mg total) by mouth 3 (three) times daily. 90 tablet 11  . isosorbide mononitrate (IMDUR) 30 MG 24 hr tablet Take 1 tablet (30 mg total) by mouth daily. 30 tablet 10  . lisinopril (PRINIVIL,ZESTRIL) 40 MG tablet Take 1 tablet by mouth every day 30 tablet 11  . metFORMIN (GLUCOPHAGE) 1000 MG tablet take 1 tablet by mouth twice a day with food 180 tablet 1  . Multiple Vitamins-Minerals (MULTIVITAMIN WITH MINERALS) tablet Take 1 tablet by mouth  daily.      . nitroGLYCERIN (NITROSTAT) 0.4 MG SL tablet Place 0.4 mg under the tongue every 5 (five) minutes as needed for chest pain (x 3 tabs).    . rosuvastatin (CRESTOR) 40 MG tablet take 1 tablet by mouth once daily 30 tablet 11  . spironolactone (ALDACTONE) 25 MG tablet Take 0.5 tablets (12.5 mg total) by mouth daily. 30 tablet 11  . tetrahydrozoline-zinc (VISINE-AC) 0.05-0.25 % ophthalmic solution Place 1 drop into both eyes daily as needed (allergy irritation.).    Marland Kitchen ZETIA 10 MG tablet take 1 tablet by mouth once daily 30 tablet 11   Current Facility-Administered Medications on File Prior to Visit  Medication Dose Route Frequency Provider Last Rate Last Dose  . 0.9 %  sodium chloride infusion  500 mL Intravenous Continuous Ladene Artist, MD        No Known Allergies   Assessment/Plan:  1. HF medication optimization - Patient with HF and hypertension, has actually had more problems with hypotension recently.   He is mostly asymptomatic and is feeling well.  BP stable today at 102/70.  Will have him continue to monitor at local pharmacy when able.  No changes to medication at this time, as his BP does not support an increase in the carvedilol and the lisinopril is already at maximum dose.     Tommy Medal PharmD CPP [facility] [address], [place], [place] [address] Phone: [phone]; Fax: [phone] 10/22/2016 7:30 AM

## 2016-10-22 NOTE — Patient Instructions (Signed)
  Your blood pressure today is 102/70  Check your blood pressure at the pharmacy on occasion and keep record of the readings.  Take your BP meds as follows:  Continue with all your current medications  Bring all of your meds, your BP cuff and your record of home blood pressures to your next appointment.  Exercise as you're able, try to walk approximately 30 minutes per day.  Keep salt intake to a minimum, especially watch canned and prepared boxed foods.  Eat more fresh fruits and vegetables and fewer canned items.  Avoid eating in fast food restaurants.    HOW TO TAKE YOUR BLOOD PRESSURE: . Rest 5 minutes before taking your blood pressure. .  Don't smoke or drink caffeinated beverages for at least 30 minutes before. . Take your blood pressure before (not after) you eat. . Sit comfortably with your back supported and both feet on the floor (don't cross your legs). . Elevate your arm to heart level on a table or a desk. . Use the proper sized cuff. It should fit smoothly and snugly around your bare upper arm. There should be enough room to slip a fingertip under the cuff. The bottom edge of the cuff should be 1 inch above the crease of the elbow. . Ideally, take 3 measurements at one sitting and record the average.

## 2016-10-30 ENCOUNTER — Encounter: Payer: Self-pay | Admitting: Pharmacist Clinician (PhC)/ Clinical Pharmacy Specialist

## 2016-10-30 NOTE — Assessment & Plan Note (Addendum)
Patient with HF and hypertension, has actually had more problems with hypotension recently.   He is mostly asymptomatic and is feeling well.  BP stable today at 102/70.  Will have him continue to monitor at local pharmacy when able.  No changes to medication at this time, as his BP does not support an increase in the carvedilol and the lisinopril is already at maximum dose.

## 2016-12-04 ENCOUNTER — Other Ambulatory Visit: Payer: Self-pay | Admitting: Cardiology

## 2016-12-04 DIAGNOSIS — I2510 Atherosclerotic heart disease of native coronary artery without angina pectoris: Secondary | ICD-10-CM

## 2016-12-04 DIAGNOSIS — I10 Essential (primary) hypertension: Secondary | ICD-10-CM

## 2017-01-07 ENCOUNTER — Other Ambulatory Visit: Payer: Self-pay | Admitting: Physician Assistant

## 2017-01-13 ENCOUNTER — Other Ambulatory Visit: Payer: Self-pay | Admitting: Internal Medicine

## 2017-01-13 ENCOUNTER — Other Ambulatory Visit: Payer: Self-pay | Admitting: Physician Assistant

## 2017-01-27 ENCOUNTER — Other Ambulatory Visit: Payer: Self-pay | Admitting: Cardiology

## 2017-02-12 ENCOUNTER — Other Ambulatory Visit (INDEPENDENT_AMBULATORY_CARE_PROVIDER_SITE_OTHER): Payer: Medicare HMO

## 2017-02-12 ENCOUNTER — Encounter: Payer: Self-pay | Admitting: Internal Medicine

## 2017-02-12 DIAGNOSIS — E1159 Type 2 diabetes mellitus with other circulatory complications: Secondary | ICD-10-CM

## 2017-02-12 DIAGNOSIS — Z0000 Encounter for general adult medical examination without abnormal findings: Secondary | ICD-10-CM

## 2017-02-12 LAB — CBC WITH DIFFERENTIAL/PLATELET
Basophils Absolute: 0 10*3/uL (ref 0.0–0.1)
Basophils Relative: 0.4 % (ref 0.0–3.0)
EOS PCT: 3.2 % (ref 0.0–5.0)
Eosinophils Absolute: 0.2 10*3/uL (ref 0.0–0.7)
HEMATOCRIT: 37 % — AB (ref 39.0–52.0)
HEMOGLOBIN: 12 g/dL — AB (ref 13.0–17.0)
LYMPHS PCT: 20.4 % (ref 12.0–46.0)
Lymphs Abs: 1.4 10*3/uL (ref 0.7–4.0)
MCHC: 32.5 g/dL (ref 30.0–36.0)
MCV: 90.8 fl (ref 78.0–100.0)
MONO ABS: 0.5 10*3/uL (ref 0.1–1.0)
MONOS PCT: 7.9 % (ref 3.0–12.0)
Neutro Abs: 4.6 10*3/uL (ref 1.4–7.7)
Neutrophils Relative %: 68.1 % (ref 43.0–77.0)
Platelets: 202 10*3/uL (ref 150.0–400.0)
RBC: 4.08 Mil/uL — AB (ref 4.22–5.81)
RDW: 12.9 % (ref 11.5–15.5)
WBC: 6.7 10*3/uL (ref 4.0–10.5)

## 2017-02-12 LAB — HEPATIC FUNCTION PANEL
ALT: 20 U/L (ref 0–53)
AST: 22 U/L (ref 0–37)
Albumin: 4.2 g/dL (ref 3.5–5.2)
Alkaline Phosphatase: 35 U/L — ABNORMAL LOW (ref 39–117)
BILIRUBIN TOTAL: 0.6 mg/dL (ref 0.2–1.2)
Bilirubin, Direct: 0.1 mg/dL (ref 0.0–0.3)
Total Protein: 7.1 g/dL (ref 6.0–8.3)

## 2017-02-12 LAB — BASIC METABOLIC PANEL
BUN: 16 mg/dL (ref 6–23)
CALCIUM: 9.6 mg/dL (ref 8.4–10.5)
CO2: 28 mEq/L (ref 19–32)
Chloride: 106 mEq/L (ref 96–112)
Creatinine, Ser: 1.42 mg/dL (ref 0.40–1.50)
GFR: 63.72 mL/min (ref >60.00)
GLUCOSE: 120 mg/dL — AB (ref 70–99)
Potassium: 4.1 mEq/L (ref 3.5–5.1)
SODIUM: 141 meq/L (ref 135–145)

## 2017-02-12 LAB — URINALYSIS, ROUTINE W REFLEX MICROSCOPIC
Bilirubin Urine: NEGATIVE
Hgb urine dipstick: NEGATIVE
Leukocytes, UA: NEGATIVE
Nitrite: NEGATIVE
RBC / HPF: NONE SEEN (ref 0–?)
SPECIFIC GRAVITY, URINE: 1.025 (ref 1.000–1.030)
Total Protein, Urine: NEGATIVE
Urine Glucose: NEGATIVE
Urobilinogen, UA: 1 (ref 0.0–1.0)
pH: 6 (ref 5.0–8.0)

## 2017-02-12 LAB — LIPID PANEL
Cholesterol: 113 mg/dL (ref 0–200)
HDL: 30.8 mg/dL — ABNORMAL LOW (ref >39.00)
LDL Cholesterol: 52 mg/dL (ref 0–99)
NONHDL: 82.07
Total CHOL/HDL Ratio: 4
Triglycerides: 152 mg/dL — ABNORMAL HIGH (ref 0.0–149.0)
VLDL: 30.4 mg/dL (ref 0.0–40.0)

## 2017-02-12 LAB — PSA: PSA: 0.93 ng/mL (ref 0.10–4.00)

## 2017-02-12 LAB — TSH: TSH: 1.15 u[IU]/mL (ref 0.35–4.50)

## 2017-02-12 LAB — MICROALBUMIN / CREATININE URINE RATIO
Creatinine,U: 274.6 mg/dL
MICROALB/CREAT RATIO: 0.9 mg/g (ref 0.0–30.0)
Microalb, Ur: 2.4 mg/dL — ABNORMAL HIGH (ref 0.0–1.9)

## 2017-02-12 LAB — HEMOGLOBIN A1C: HEMOGLOBIN A1C: 5.5 % (ref 4.6–6.5)

## 2017-02-14 ENCOUNTER — Ambulatory Visit (INDEPENDENT_AMBULATORY_CARE_PROVIDER_SITE_OTHER): Payer: Medicare HMO | Admitting: Internal Medicine

## 2017-02-14 ENCOUNTER — Encounter: Payer: Self-pay | Admitting: Internal Medicine

## 2017-02-14 VITALS — BP 138/90 | HR 71 | Temp 98.0°F | Ht 68.0 in | Wt 201.0 lb

## 2017-02-14 DIAGNOSIS — E1159 Type 2 diabetes mellitus with other circulatory complications: Secondary | ICD-10-CM

## 2017-02-14 DIAGNOSIS — I10 Essential (primary) hypertension: Secondary | ICD-10-CM | POA: Diagnosis not present

## 2017-02-14 DIAGNOSIS — Z0000 Encounter for general adult medical examination without abnormal findings: Secondary | ICD-10-CM | POA: Diagnosis not present

## 2017-02-14 MED ORDER — ISOSORBIDE MONONITRATE ER 30 MG PO TB24: 30.0000 mg | ORAL_TABLET | Freq: Every day | ORAL | 3 refills | Status: DC

## 2017-02-14 NOTE — Patient Instructions (Signed)
Please continue all other medications as before, and refills have been done if requested.  Please have the pharmacy call with any other refills you may need.  Please continue your efforts at being more active, low cholesterol diet, and weight control.  You are otherwise up to date with prevention measures today.  Please keep your appointments with your specialists as you may have planned - Dr Radford Pax  Please return in 6 months, or sooner if needed, with Lab testing done 3-5 days before

## 2017-02-14 NOTE — Progress Notes (Signed)
Subjective:    Patient ID: Jesse Woods, male    DOB: 01-20-1951, 66 y.o.   MRN: [id number]  HPI  Here for wellness and f/u;  Overall doing ok;  Pt denies Chest pain, worsening SOB, DOE, wheezing, orthopnea, PND, worsening LE edema, palpitations, dizziness or syncope.  Pt denies neurological change such as new headache, facial or extremity weakness.  Pt denies polydipsia, polyuria, or low sugar symptoms. Pt states overall good compliance with treatment and medications, good tolerability, and has been trying to follow appropriate diet.  Pt denies worsening depressive symptoms, suicidal ideation or panic. No fever, night sweats, wt loss, loss of appetite, or other constitutional symptoms.  Pt states good ability with ADL's, has low fall risk, home safety reviewed and adequate, no other significant changes in hearing or vision, and only occasionally active with exercise.  Due for Dr Katy Fitch optho soon.   No other interval hx or new complaints Past Medical History:  Diagnosis Date  . Arthritis    "back and left hip" (12/22/2014)  . Chronic systolic CHF (congestive heart failure), NYHA class 2 ([place])   . Coronary artery disease 01/2015   chronic total occlusion of mid LCx and severely diseased RCA on previous cath in February 2016. EF at that time was 15%.He was cleared by neurosurgery for DAPT and then wasseen by Dr. Irish Lack to proceed with PCI of RCA. LHC 12/22/2014 showed 90% proximal to mid RCA lesion that treated with 3.0 x 32 mm Synergy DES  . DCM (dilated cardiomyopathy) ([place]) 06/22/2014   EF was 15% but now 35-40% after PCI  . Diabetes mellitus, type II ([place])   . Hyperlipidemia   . Hypertension   . Subarachnoid hemorrhage ([place]) 1998   from HTN   Past Surgical History:  Procedure Laterality Date  . BRAIN SURGERY  1998   right frontal ventriculotomy with subsequent ventriculo-abfdominal shunt due to [facility]  . CARDIAC CATHETERIZATION  06/24/2014  . CORONARY ANGIOPLASTY    . CSF SHUNT  1998   . INGUINAL HERNIA REPAIR Right 1954    reports that  has never smoked. he has never used smokeless tobacco. He reports that he drinks alcohol. He reports that he does not use drugs. family history includes Cancer in his brother, cousin, father, and mother; Diabetes in his father; Hypertension in his father; Pancreatic cancer in his mother; Prostate cancer in his father and paternal uncle. No Known Allergies Current Outpatient Medications on File Prior to Visit  Medication Sig Dispense Refill  . aspirin 81 MG tablet Take 81 mg by mouth daily.      . carvedilol (COREG) 12.5 MG tablet take 1 and 1/2 tablet by mouth twice a day 90 tablet 3  . clopidogrel (PLAVIX) 75 MG tablet take 1 tablet by mouth once daily 90 tablet 3  . ezetimibe (ZETIA) 10 MG tablet Take 1 tablet (10 mg total) by mouth daily. 90 tablet 1  . furosemide (LASIX) 40 MG tablet Take 40 mg by mouth daily.  0  . hydrALAZINE (APRESOLINE) 25 MG tablet Take 0.5 tablets (12.5 mg total) by mouth 3 (three) times daily. 90 tablet 11  . hydrALAZINE (APRESOLINE) 25 MG tablet take 1 tablet by mouth three times a day 90 tablet 11  . lisinopril (PRINIVIL,ZESTRIL) 40 MG tablet Take 1 tablet by mouth every day 30 tablet 11  . metFORMIN (GLUCOPHAGE) 1000 MG tablet take 1 tablet by mouth twice a day with food 180 tablet 1  . Multiple Vitamins-Minerals (  MULTIVITAMIN WITH MINERALS) tablet Take 1 tablet by mouth daily.      . nitroGLYCERIN (NITROSTAT) 0.4 MG SL tablet Place 0.4 mg under the tongue every 5 (five) minutes as needed for chest pain (x 3 tabs).    . rosuvastatin (CRESTOR) 40 MG tablet Take 1 tablet (40 mg total) by mouth daily. 90 tablet 1  . spironolactone (ALDACTONE) 25 MG tablet take 1/2 tablet by mouth once daily 15 tablet 3  . tetrahydrozoline-zinc (VISINE-AC) 0.05-0.25 % ophthalmic solution Place 1 drop into both eyes daily as needed (allergy irritation.).     No current facility-administered medications on file prior to visit.     Review of Systems Constitutional: Negative for other unusual diaphoresis, sweats, appetite or weight changes HENT: Negative for other worsening hearing loss, ear pain, facial swelling, mouth sores or neck stiffness.   Eyes: Negative for other worsening pain, redness or other visual disturbance.  Respiratory: Negative for other stridor or swelling Cardiovascular: Negative for other palpitations or other chest pain  Gastrointestinal: Negative for worsening diarrhea or loose stools, blood in stool, distention or other pain Genitourinary: Negative for hematuria, flank pain or other change in urine volume.  Musculoskeletal: Negative for myalgias or other joint swelling.  Skin: Negative for other color change, or other wound or worsening drainage.  Neurological: Negative for other syncope or numbness. Hematological: Negative for other adenopathy or swelling Psychiatric/Behavioral: Negative for hallucinations, other worsening agitation, SI, self-injury, or new decreased concentration All other system neg per pt    Objective:   Physical Exam BP 138/90   Pulse 71   Temp 98 F (36.7 C) (Oral)   Ht 5\' 8"  (1.727 m)   Wt 201 lb (91.2 kg)   SpO2 100%   BMI 30.56 kg/m  VS noted,  Constitutional: Pt is oriented to person, place, and time. Appears well-developed and well-nourished, in no significant distress and comfortable Head: Normocephalic and atraumatic  Eyes: Conjunctivae and EOM are normal. Pupils are equal, round, and reactive to light Right Ear: External ear normal without discharge Left Ear: External ear normal without discharge Nose: Nose without discharge or deformity Mouth/Throat: Oropharynx is without other ulcerations and moist  Neck: Normal range of motion. Neck supple. No JVD present. No tracheal deviation present or significant neck LA or mass Cardiovascular: Normal rate, regular rhythm, normal heart sounds and intact distal pulses.   Pulmonary/Chest: WOB normal and breath  sounds without rales or wheezing  Abdominal: Soft. Bowel sounds are normal. NT. No HSM  Musculoskeletal: Normal range of motion. Exhibits no edema Lymphadenopathy: Has no other cervical adenopathy.  Neurological: Pt is alert and oriented to person, place, and time. Pt has normal reflexes. No cranial nerve deficit. Motor grossly intact, Gait intact Skin: Skin is warm and dry. No rash noted or new ulcerations Psychiatric:  Has normal mood and affect. Behavior is normal without agitation No other exam findings Lab Results  Component Value Date   WBC 6.7 02/12/2017   HGB 12.0 (L) 02/12/2017   HCT 37.0 (L) 02/12/2017   PLT 202.0 02/12/2017   GLUCOSE 120 (H) 02/12/2017   CHOL 113 02/12/2017   TRIG 152.0 (H) 02/12/2017   HDL 30.80 (L) 02/12/2017   LDLDIRECT 215.3 01/03/2011   LDLCALC 52 02/12/2017   ALT 20 02/12/2017   AST 22 02/12/2017   NA 141 02/12/2017   K 4.1 02/12/2017   CL 106 02/12/2017   CREATININE 1.42 02/12/2017   BUN 16 02/12/2017   CO2 28 02/12/2017  TSH 1.15 02/12/2017   PSA 0.93 02/12/2017   INR 1.1 (H) 12/19/2014   HGBA1C 5.5 02/12/2017   MICROALBUR 2.4 (H) 02/12/2017       Assessment & Plan:

## 2017-02-16 ENCOUNTER — Encounter: Payer: Self-pay | Admitting: Internal Medicine

## 2017-02-16 NOTE — Assessment & Plan Note (Signed)

## 2017-02-16 NOTE — Assessment & Plan Note (Signed)
BP Readings from Last 3 Encounters:  02/14/17 138/90  10/22/16 102/70  09/25/16 (!) 88/53  stable overall by history and exam, recent data reviewed with pt, and pt to continue medical treatment as before,  to f/u any worsening symptoms or concerns

## 2017-02-16 NOTE — Assessment & Plan Note (Signed)
stable overall by history and exam, recent data reviewed with pt, and pt to continue medical treatment as before,  to f/u any worsening symptoms or concerns  

## 2017-03-19 DIAGNOSIS — H401122 Primary open-angle glaucoma, left eye, moderate stage: Secondary | ICD-10-CM | POA: Diagnosis not present

## 2017-03-19 DIAGNOSIS — H25813 Combined forms of age-related cataract, bilateral: Secondary | ICD-10-CM | POA: Diagnosis not present

## 2017-03-19 DIAGNOSIS — H401111 Primary open-angle glaucoma, right eye, mild stage: Secondary | ICD-10-CM | POA: Diagnosis not present

## 2017-03-19 DIAGNOSIS — E119 Type 2 diabetes mellitus without complications: Secondary | ICD-10-CM | POA: Diagnosis not present

## 2017-04-17 ENCOUNTER — Other Ambulatory Visit: Payer: Self-pay | Admitting: Cardiology

## 2017-04-17 DIAGNOSIS — I2510 Atherosclerotic heart disease of native coronary artery without angina pectoris: Secondary | ICD-10-CM

## 2017-04-17 MED ORDER — EZETIMIBE 10 MG PO TABS: 10.0000 mg | ORAL_TABLET | Freq: Every day | ORAL | 0 refills | Status: DC

## 2017-04-17 MED ORDER — ROSUVASTATIN CALCIUM 40 MG PO TABS: 40.0000 mg | ORAL_TABLET | Freq: Every day | ORAL | 0 refills | Status: DC

## 2017-06-18 ENCOUNTER — Other Ambulatory Visit: Payer: Self-pay | Admitting: Cardiology

## 2017-06-18 MED ORDER — SPIRONOLACTONE 25 MG PO TABS: 12.5000 mg | ORAL_TABLET | Freq: Every day | ORAL | 0 refills | Status: DC

## 2017-06-30 ENCOUNTER — Other Ambulatory Visit: Payer: Self-pay | Admitting: Cardiology

## 2017-06-30 DIAGNOSIS — I2510 Atherosclerotic heart disease of native coronary artery without angina pectoris: Secondary | ICD-10-CM

## 2017-07-21 ENCOUNTER — Other Ambulatory Visit: Payer: Self-pay | Admitting: Cardiology

## 2017-07-21 DIAGNOSIS — I2510 Atherosclerotic heart disease of native coronary artery without angina pectoris: Secondary | ICD-10-CM

## 2017-07-21 MED ORDER — SPIRONOLACTONE 25 MG PO TABS: 12.5000 mg | ORAL_TABLET | Freq: Every day | ORAL | 0 refills | Status: DC

## 2017-07-21 MED ORDER — LISINOPRIL 40 MG PO TABS: ORAL_TABLET | ORAL | 0 refills | Status: DC

## 2017-07-21 NOTE — Telephone Encounter (Signed)
Pt's medication was sent to pt's pharmacy as requested. Confirmation received.  °

## 2017-07-21 NOTE — Telephone Encounter (Signed)
New message     *STAT* If patient is at the pharmacy, call can be transferred to refill team.   1. Which medications need to be refilled? (please list name of each medication and dose if known)  spironolactone (ALDACTONE) 25 MG tablet Take 0.5 tablets (12.5 mg total) by mouth daily. Please make overdue yearly appt with Dr. Radford Pax before anymore refills. 2nd attempt        2. Which pharmacy/location (including street and city if local pharmacy) is medication to be sent to? [place]  3. Do they need a 30 day or 90 day supply? 30  Has appt for 07/24/17 240p

## 2017-07-24 ENCOUNTER — Encounter: Payer: Self-pay | Admitting: Cardiology

## 2017-07-24 ENCOUNTER — Ambulatory Visit (INDEPENDENT_AMBULATORY_CARE_PROVIDER_SITE_OTHER): Payer: Medicare HMO | Admitting: Cardiology

## 2017-07-24 VITALS — BP 122/70 | HR 92 | Ht 68.0 in | Wt 197.0 lb

## 2017-07-24 DIAGNOSIS — I2510 Atherosclerotic heart disease of native coronary artery without angina pectoris: Secondary | ICD-10-CM | POA: Diagnosis not present

## 2017-07-24 DIAGNOSIS — I10 Essential (primary) hypertension: Secondary | ICD-10-CM | POA: Diagnosis not present

## 2017-07-24 DIAGNOSIS — I255 Ischemic cardiomyopathy: Secondary | ICD-10-CM

## 2017-07-24 DIAGNOSIS — I5022 Chronic systolic (congestive) heart failure: Secondary | ICD-10-CM | POA: Diagnosis not present

## 2017-07-24 DIAGNOSIS — E785 Hyperlipidemia, unspecified: Secondary | ICD-10-CM | POA: Diagnosis not present

## 2017-07-24 MED ORDER — EZETIMIBE 10 MG PO TABS: 10.0000 mg | ORAL_TABLET | Freq: Every day | ORAL | 3 refills | Status: DC

## 2017-07-24 MED ORDER — ISOSORBIDE MONONITRATE ER 30 MG PO TB24: 30.0000 mg | ORAL_TABLET | Freq: Every day | ORAL | 3 refills | Status: DC

## 2017-07-24 MED ORDER — CARVEDILOL 12.5 MG PO TABS: ORAL_TABLET | ORAL | 3 refills | Status: DC

## 2017-07-24 MED ORDER — ROSUVASTATIN CALCIUM 40 MG PO TABS: 40.0000 mg | ORAL_TABLET | Freq: Every day | ORAL | 3 refills | Status: DC

## 2017-07-24 MED ORDER — LISINOPRIL 40 MG PO TABS
ORAL_TABLET | ORAL | 3 refills | Status: DC
Start: 2017-07-24 — End: 2018-09-02

## 2017-07-24 MED ORDER — SPIRONOLACTONE 25 MG PO TABS: 12.5000 mg | ORAL_TABLET | Freq: Every day | ORAL | 3 refills | Status: DC

## 2017-07-24 MED ORDER — HYDRALAZINE HCL 25 MG PO TABS: 12.5000 mg | ORAL_TABLET | Freq: Three times a day (TID) | ORAL | 3 refills | Status: DC

## 2017-07-24 MED ORDER — CLOPIDOGREL BISULFATE 75 MG PO TABS: 75.0000 mg | ORAL_TABLET | Freq: Every day | ORAL | 3 refills | Status: DC

## 2017-07-24 NOTE — Patient Instructions (Signed)
Medication Instructions:  Your physician recommends that you continue on your current medications as directed. Please refer to the Current Medication list given to you today.  If you need a refill on your cardiac medications, please contact your pharmacy first.  Labwork: Today for kidney function test   Testing/Procedures: Your physician has requested that you have an echocardiogram. Echocardiography is a painless test that uses sound waves to create images of your heart. It provides your doctor with information about the size and shape of your heart and how well your heart's chambers and valves are working. This procedure takes approximately one hour. There are no restrictions for this procedure.   Follow-Up: Your physician wants you to follow-up in: 6 months with Dr. Radford Pax. You will receive a reminder letter in the mail two months in advance. If you don't receive a letter, please call our office to schedule the follow-up appointment.  Any Other Special Instructions Will Be Listed Below (If Applicable).   Thank you for choosing [facility], RN  [phone]  If you need a refill on your cardiac medications before your next appointment, please call your pharmacy.

## 2017-07-24 NOTE — Progress Notes (Signed)
Cardiology Office Note:    Date:  07/24/2017   ID:  Jesse Woods, DOB April 16, 1950, MRN [id number]  PCP:  Biagio Borg, MD  Cardiologist:  No primary care provider on file.    Referring MD: Biagio Borg, MD   Chief Complaint  Patient presents with  . Coronary Artery Disease  . Hypertension  . Hyperlipidemia  . Congestive Heart Failure    History of Present Illness:    Jesse Woods is a 67 y.o. male with a hx of HTN, HL, DM2, remote history of subarachnoid hemorrhage, CAD and ischemic cardiomyopathy. He was noted to have chronic total occlusion of mid LCx and severely diseased RCA on previous cath in February 2016. EF at that time was 15%. A FUecho in August 2016 showed EF had improved to 35-40%. He was cleared by neurosurgery for DAPT and then wasseen by Dr. Irish Lack to proceed with PCI of RCA. LHC 12/22/2014 showed 90% proximal to mid RCA lesion that treated with 3.0 x 32 mm Synergy DES. He had residual chronic total occlusion of mid left circumflex with left to left collaterals. Normal LVEDP 5 mmHg. His ACE I was changed to [facility] but unfortunately, his insurance did not cover Entresto and it was too expensive. He is back on Lisinopril 20 bid.  he is here today for followup and is doing well.  He denies any chest pain or pressure, SOB, DOE, PND, orthopnea, LE edema, dizziness, palpitations or syncope. He is compliant with his meds and is tolerating meds with no SE.      Past Medical History:  Diagnosis Date  . Arthritis    "back and left hip" (12/22/2014)  . Chronic systolic CHF (congestive heart failure), NYHA class 2 ([place])   . Coronary artery disease 01/2015   chronic total occlusion of mid LCx and severely diseased RCA on previous cath in February 2016. EF at that time was 15%.He was cleared by neurosurgery for DAPT and then wasseen by Dr. Irish Lack to proceed with PCI of RCA. LHC 12/22/2014 showed 90% proximal to mid RCA lesion that treated with 3.0 x 32 mm Synergy  DES  . DCM (dilated cardiomyopathy) ([place]) 06/22/2014   EF was 15% but now 35-40% after PCI  . Diabetes mellitus, type II ([place])   . Hyperlipidemia   . Hypertension   . Subarachnoid hemorrhage ([place]) 1998   from HTN    Past Surgical History:  Procedure Laterality Date  . BRAIN SURGERY  1998   right frontal ventriculotomy with subsequent ventriculo-abfdominal shunt due to [facility]  . CARDIAC CATHETERIZATION N/A 12/22/2014   Procedure: Coronary Stent Intervention;  Surgeon: Jettie Booze, MD;  Location: [place] CV LAB;  Service: Cardiovascular;  Laterality: N/A;  . CARDIAC CATHETERIZATION  12/22/2014   Procedure: Left Heart Cath and Coronary Angiography;  Surgeon: Jettie Booze, MD;  Location: [place] CV LAB;  Service: Cardiovascular;;  . CARDIAC CATHETERIZATION  06/24/2014  . CORONARY ANGIOPLASTY    . CSF SHUNT  1998  . INGUINAL HERNIA REPAIR Right 1954  . LEFT HEART CATHETERIZATION WITH CORONARY ANGIOGRAM N/A 06/24/2014   Procedure: LEFT HEART CATHETERIZATION WITH CORONARY ANGIOGRAM;  Surgeon: Jettie Booze, MD;  Location: [facility] CATH LAB;  Service: Cardiovascular;  Laterality: N/A;    Current Medications: Current Meds  Medication Sig  . aspirin 81 MG tablet Take 81 mg by mouth daily.    . carvedilol (COREG) 12.5 MG tablet Take 1.5 tablets by mouth twice a day. Please  keep upcoming appt for future refills. Thank you  . clopidogrel (PLAVIX) 75 MG tablet Take 1 tablet (75 mg total) by mouth daily.  Marland Kitchen ezetimibe (ZETIA) 10 MG tablet Take 1 tablet (10 mg total) by mouth daily. Please keep upcoming appt for future refills. Thank you  . furosemide (LASIX) 40 MG tablet Take 40 mg by mouth daily.  . hydrALAZINE (APRESOLINE) 25 MG tablet Take 0.5 tablets (12.5 mg total) by mouth 3 (three) times daily.  . isosorbide mononitrate (IMDUR) 30 MG 24 hr tablet Take 1 tablet (30 mg total) by mouth daily.  Marland Kitchen lisinopril (PRINIVIL,ZESTRIL) 40 MG tablet Take 1 tablet by mouth every day.  Please keep upcoming appt for future refills. Thank you  . metFORMIN (GLUCOPHAGE) 1000 MG tablet take 1 tablet by mouth twice a day with food  . Multiple Vitamins-Minerals (MULTIVITAMIN WITH MINERALS) tablet Take 1 tablet by mouth daily.    . nitroGLYCERIN (NITROSTAT) 0.4 MG SL tablet Place 0.4 mg under the tongue every 5 (five) minutes as needed for chest pain (x 3 tabs).  . rosuvastatin (CRESTOR) 40 MG tablet Take 1 tablet (40 mg total) by mouth daily. Please keep upcoming appt for future refills. Thank you  . spironolactone (ALDACTONE) 25 MG tablet Take 0.5 tablets (12.5 mg total) by mouth daily.  Marland Kitchen tetrahydrozoline-zinc (VISINE-AC) 0.05-0.25 % ophthalmic solution Place 1 drop into both eyes daily as needed (allergy irritation.).  . [DISCONTINUED] carvedilol (COREG) 12.5 MG tablet Take 1.5 tablets by mouth twice a day. Please keep upcoming appt for future refills. Thank you  . [DISCONTINUED] clopidogrel (PLAVIX) 75 MG tablet take 1 tablet by mouth once daily  . [DISCONTINUED] ezetimibe (ZETIA) 10 MG tablet Take 1 tablet (10 mg total) by mouth daily. Please keep upcoming appt for future refills. Thank you  . [DISCONTINUED] hydrALAZINE (APRESOLINE) 25 MG tablet Take 0.5 tablets (12.5 mg total) by mouth 3 (three) times daily.  . [DISCONTINUED] isosorbide mononitrate (IMDUR) 30 MG 24 hr tablet Take 1 tablet (30 mg total) daily by mouth.  . [DISCONTINUED] lisinopril (PRINIVIL,ZESTRIL) 40 MG tablet Take 1 tablet by mouth every day. Please keep upcoming appt for future refills. Thank you  . [DISCONTINUED] rosuvastatin (CRESTOR) 40 MG tablet Take 1 tablet (40 mg total) by mouth daily. Please keep upcoming appt for future refills. Thank you  . [DISCONTINUED] spironolactone (ALDACTONE) 25 MG tablet TAKE 1/2 TABLET BY MOUTH ONCE DAILY     Allergies:   Patient has no known allergies.   Social History   Socioeconomic History  . Marital status: Divorced    Spouse name: Not on file  . Number of children:  2  . Years of education: Not on file  . Highest education level: Not on file  Occupational History  . Occupation: retired  Scientific laboratory technician  . Financial resource strain: Not on file  . Food insecurity:    Worry: Not on file    Inability: Not on file  . Transportation needs:    Medical: Not on file    Non-medical: Not on file  Tobacco Use  . Smoking status: Never Smoker  . Smokeless tobacco: Never Used  Substance and Sexual Activity  . Alcohol use: Yes    Comment: 12/22/2014 "no alcohol since 1998"  . Drug use: No  . Sexual activity: Not Currently    Partners: Female  Lifestyle  . Physical activity:    Days per week: Not on file    Minutes per session: Not on file  .  Stress: Not on file  Relationships  . Social connections:    Talks on phone: Not on file    Gets together: Not on file    Attends religious service: Not on file    Active member of club or organization: Not on file    Attends meetings of clubs or organizations: Not on file    Relationship status: Not on file  Other Topics Concern  . Not on file  Social History Narrative   HSG, Graduated [employer]. Married- '72-'86; remarried '86-'90, single. 1 son- '73, 1 daughter-'79; 2 grandchildren. Retired after [employer]	[place], worked for [employer] before retirement. Had served in [employer] force. He did live in [place]. Lives together with his son. Very active in his church     Family History: The patient's family history includes Cancer in his brother, cousin, father, and mother; Diabetes in his father; Hypertension in his father; Pancreatic cancer in his mother; Prostate cancer in his father and paternal uncle.  ROS:   Please see the history of present illness.    ROS  All other systems reviewed and negative.   EKGs/Labs/Other Studies Reviewed:    The following studies were reviewed today: none  EKG:  EKG is ordered today.  The ekg ordered today demonstrates NSR at 92bpm   Recent  Labs: 02/12/2017: ALT 20; BUN 16; Creatinine, Ser 1.42; Hemoglobin 12.0; Platelets 202.0; Potassium 4.1; Sodium 141; TSH 1.15   Recent Lipid Panel    Component Value Date/Time   CHOL 113 02/12/2017 1516   TRIG 152.0 (H) 02/12/2017 1516   HDL 30.80 (L) 02/12/2017 1516   CHOLHDL 4 02/12/2017 1516   VLDL 30.4 02/12/2017 1516   LDLCALC 52 02/12/2017 1516   LDLDIRECT 215.3 01/03/2011 1035    Physical Exam:    VS:  BP 122/70   Pulse 92   Ht 5\' 8"  (1.727 m)   Wt 197 lb (89.4 kg)   SpO2 97%   BMI 29.95 kg/m     Wt Readings from Last 3 Encounters:  07/24/17 197 lb (89.4 kg)  02/14/17 201 lb (91.2 kg)  09/25/16 188 lb (85.3 kg)     GEN:  Well nourished, well developed in no acute distress HEENT: Normal NECK: No JVD; No carotid bruits LYMPHATICS: No lymphadenopathy CARDIAC: RRR, no murmurs, rubs, gallops RESPIRATORY:  Clear to auscultation without rales, wheezing or rhonchi  ABDOMEN: Soft, non-tender, non-distended MUSCULOSKELETAL:  No edema; No deformity  SKIN: Warm and dry NEUROLOGIC:  Alert and oriented x 3 PSYCHIATRIC:  Normal affect   ASSESSMENT:    1. Coronary artery disease involving native coronary artery of native heart without angina pectoris   2. Chronic systolic CHF (congestive heart failure), NYHA class 2 ([place])   3. Ischemic cardiomyopathy   4. Essential hypertension   5. Dyslipidemia    PLAN:    In order of problems listed above:  1.  ASCAD - cath showed chronic total occlusion of mid LCx and severely diseased RCA on cath in February 2016. EF at that time was 15%. A FUecho in August 2016 showed EF had improved to 35-40%. LHC 12/22/2014 showed 90% proximal to mid RCA lesion that treated with 3.0 x 32 mm Synergy DES. He had residual chronic total occlusion of mid left circumflex with left to left collaterals.  He is doing well with no anginal chest pain.  He will continue on aspirin 81 mg daily, Plavix 75 mg daily, beta-blocker, Imdur 30 mg daily and  statin.  2.  Chronic systolic CHF -he appears euvolemic on exam today.  He weighs at home himself at home and says his is weight is stable.  He will continue on carvedilol 18.75 mg twice daily, Lasix 40 mg daily, hydralazine 0.5 mg 3 times daily, Imdur 30 mg daily, lisinopril 40 mg daily and spironolactone 12.5 mg daily.  Creatinine was stable at 1.42 on 02/12/2017.  I will repeat a bmet today since he is on Lasix.  I would like to repeat a 2D echo to make sure LVF is stable.    3.  Ischemic CM -2D echocardiogram 02/05/2016 showed EF 35-40% with grade 1 diastolic dysfunction.  No ICD indicated.  4.  HTN - blood pressure is well controlled on exam today.  He will continue on carvedilol 18.75 mg twice daily, Hydralalzine 12.5 mg 3 times daily, lisinopril 40 mg daily and Aldactone 12.5 mg daily.  5.  Dyslipidemia - LDL goal is less than 70.  His last LDL was 52 on 02/22/2017.  He will continue on Crestor 40 mg daily.   Medication Adjustments/Labs and Tests Ordered: Current medicines are reviewed at length with the patient today.  Concerns regarding medicines are outlined above.  Orders Placed This Encounter  Procedures  . Basic metabolic panel  . EKG 12-Lead  . ECHOCARDIOGRAM COMPLETE   Meds ordered this encounter  Medications  . carvedilol (COREG) 12.5 MG tablet    Sig: Take 1.5 tablets by mouth twice a day. Please keep upcoming appt for future refills. Thank you    Dispense:  180 tablet    Refill:  3    Please keep upcoming appt for future refills. Thank you  . clopidogrel (PLAVIX) 75 MG tablet    Sig: Take 1 tablet (75 mg total) by mouth daily.    Dispense:  90 tablet    Refill:  3  . ezetimibe (ZETIA) 10 MG tablet    Sig: Take 1 tablet (10 mg total) by mouth daily. Please keep upcoming appt for future refills. Thank you    Dispense:  90 tablet    Refill:  3  . hydrALAZINE (APRESOLINE) 25 MG tablet    Sig: Take 0.5 tablets (12.5 mg total) by mouth 3 (three) times daily.     Dispense:  120 tablet    Refill:  3  . isosorbide mononitrate (IMDUR) 30 MG 24 hr tablet    Sig: Take 1 tablet (30 mg total) by mouth daily.    Dispense:  90 tablet    Refill:  3  . lisinopril (PRINIVIL,ZESTRIL) 40 MG tablet    Sig: Take 1 tablet by mouth every day. Please keep upcoming appt for future refills. Thank you    Dispense:  90 tablet    Refill:  3    Dose increase  . rosuvastatin (CRESTOR) 40 MG tablet    Sig: Take 1 tablet (40 mg total) by mouth daily. Please keep upcoming appt for future refills. Thank you    Dispense:  90 tablet    Refill:  3  . spironolactone (ALDACTONE) 25 MG tablet    Sig: Take 0.5 tablets (12.5 mg total) by mouth daily.    Dispense:  45 tablet    Refill:  3    Signed, Fransico Him, MD  07/24/2017 2:53 PM    [place]

## 2017-07-25 LAB — BASIC METABOLIC PANEL
BUN/Creatinine Ratio: 8 — ABNORMAL LOW (ref 10–24)
BUN: 12 mg/dL (ref 8–27)
CO2: 23 mmol/L (ref 20–29)
Calcium: 9.3 mg/dL (ref 8.6–10.2)
Chloride: 104 mmol/L (ref 96–106)
Creatinine, Ser: 1.53 mg/dL — ABNORMAL HIGH (ref 0.76–1.27)
GFR calc Af Amer: 53 mL/min/{1.73_m2} — ABNORMAL LOW (ref >59)
GFR, EST NON AFRICAN AMERICAN: 46 mL/min/{1.73_m2} — AB (ref >59)
GLUCOSE: 162 mg/dL — AB (ref 65–99)
POTASSIUM: 4.6 mmol/L (ref 3.5–5.2)
SODIUM: 142 mmol/L (ref 134–144)

## 2017-07-30 ENCOUNTER — Other Ambulatory Visit: Payer: Self-pay

## 2017-07-30 ENCOUNTER — Ambulatory Visit (HOSPITAL_COMMUNITY): Payer: Medicare HMO | Attending: Cardiovascular Disease

## 2017-07-30 DIAGNOSIS — I255 Ischemic cardiomyopathy: Secondary | ICD-10-CM | POA: Diagnosis not present

## 2017-08-14 ENCOUNTER — Ambulatory Visit (INDEPENDENT_AMBULATORY_CARE_PROVIDER_SITE_OTHER): Payer: Medicare HMO | Admitting: Internal Medicine

## 2017-08-14 ENCOUNTER — Encounter: Payer: Self-pay | Admitting: Internal Medicine

## 2017-08-14 VITALS — BP 116/76 | HR 80 | Temp 97.8°F | Ht 68.0 in | Wt 193.0 lb

## 2017-08-14 DIAGNOSIS — Z0000 Encounter for general adult medical examination without abnormal findings: Secondary | ICD-10-CM

## 2017-08-14 DIAGNOSIS — I10 Essential (primary) hypertension: Secondary | ICD-10-CM | POA: Diagnosis not present

## 2017-08-14 DIAGNOSIS — I2510 Atherosclerotic heart disease of native coronary artery without angina pectoris: Secondary | ICD-10-CM

## 2017-08-14 DIAGNOSIS — E785 Hyperlipidemia, unspecified: Secondary | ICD-10-CM

## 2017-08-14 DIAGNOSIS — E1159 Type 2 diabetes mellitus with other circulatory complications: Secondary | ICD-10-CM | POA: Diagnosis not present

## 2017-08-14 LAB — POCT GLYCOSYLATED HEMOGLOBIN (HGB A1C): Hemoglobin A1C: 5.2

## 2017-08-14 MED ORDER — NITROGLYCERIN 0.4 MG SL SUBL: 0.4000 mg | SUBLINGUAL_TABLET | SUBLINGUAL | 3 refills | Status: DC | PRN

## 2017-08-14 MED ORDER — CARVEDILOL 12.5 MG PO TABS: ORAL_TABLET | ORAL | 3 refills | Status: DC

## 2017-08-14 NOTE — Patient Instructions (Signed)
Your A1c was good today  OK to take the Coreg at 1 pill twice per day  Please continue all other medications as before, and refills have been done if requested.  Please have the pharmacy call with any other refills you may need.  Please continue your efforts at being more active, low cholesterol diabetic diet, and weight control.  Please keep your appointments with your specialists as you may have planned  Please return in 6 months, or sooner if needed, with Lab testing done 3-5 days before

## 2017-08-14 NOTE — Assessment & Plan Note (Signed)
stable overall by history and exam, recent data reviewed with pt, and pt to continue medical treatment as before,  to f/u any worsening symptoms or concerns Lab Results  Component Value Date   LDLCALC 52 02/12/2017

## 2017-08-14 NOTE — Assessment & Plan Note (Signed)
stable overall by history and exam, recent data reviewed with pt, and pt to continue medical treatment as before,  to f/u any worsening symptoms or concerns Lab Results  Component Value Date   HGBA1C 5.2 08/14/2017

## 2017-08-14 NOTE — Assessment & Plan Note (Signed)
D/w pt that he appears to be taking the coreg incorrectly, the rx at last visit per cardiology has incorrect quantity as well; will adjust rx to coreg 12.5 bid (one pill twice per day, which is actually some increase over how he has been taking)

## 2017-08-14 NOTE — Assessment & Plan Note (Signed)
stable overall by history and exam, recent data reviewed with pt, and pt to continue medical treatment as before,  to f/u any worsening symptoms or concerns  

## 2017-08-14 NOTE — Progress Notes (Signed)
Subjective:    Patient ID: Jesse Woods, male    DOB: Feb 21, 1951, 67 y.o.   MRN: [id number]  HPI  Here to f/u; overall doing ok,  Pt denies chest pain, increasing sob or doe, wheezing, orthopnea, PND, increased LE swelling, palpitations, dizziness or syncope.  Pt denies new neurological symptoms such as new headache, or facial or extremity weakness or numbness.  Pt denies polydipsia, polyuria, or low sugar episode.  Pt states overall good compliance with meds, mostly trying to follow appropriate diet, with wt overall stable,  but little exercise however.  Has some confusion, and realizes he is not taking the coreg currently at 1.5 bid, but instead one whole pill in the AM, and 1/2 in the PM.  No other interval hx or new complaint Past Medical History:  Diagnosis Date  . Arthritis    "back and left hip" (12/22/2014)  . Chronic systolic CHF (congestive heart failure), NYHA class 2 ([place])   . Coronary artery disease 01/2015   chronic total occlusion of mid LCx and severely diseased RCA on previous cath in February 2016. EF at that time was 15%.He was cleared by neurosurgery for DAPT and then wasseen by Dr. Irish Lack to proceed with PCI of RCA. LHC 12/22/2014 showed 90% proximal to mid RCA lesion that treated with 3.0 x 32 mm Synergy DES  . DCM (dilated cardiomyopathy) ([place]) 06/22/2014   EF was 15% but now 35-40% after PCI  . Diabetes mellitus, type II ([place])   . Hyperlipidemia   . Hypertension   . Subarachnoid hemorrhage ([place]) 1998   from HTN   Past Surgical History:  Procedure Laterality Date  . BRAIN SURGERY  1998   right frontal ventriculotomy with subsequent ventriculo-abfdominal shunt due to [facility]  . CARDIAC CATHETERIZATION N/A 12/22/2014   Procedure: Coronary Stent Intervention;  Surgeon: Jettie Booze, MD;  Location: [place] CV LAB;  Service: Cardiovascular;  Laterality: N/A;  . CARDIAC CATHETERIZATION  12/22/2014   Procedure: Left Heart Cath and Coronary Angiography;   Surgeon: Jettie Booze, MD;  Location: [place] CV LAB;  Service: Cardiovascular;;  . CARDIAC CATHETERIZATION  06/24/2014  . CORONARY ANGIOPLASTY    . CSF SHUNT  1998  . INGUINAL HERNIA REPAIR Right 1954  . LEFT HEART CATHETERIZATION WITH CORONARY ANGIOGRAM N/A 06/24/2014   Procedure: LEFT HEART CATHETERIZATION WITH CORONARY ANGIOGRAM;  Surgeon: Jettie Booze, MD;  Location: [facility] CATH LAB;  Service: Cardiovascular;  Laterality: N/A;    reports that he has never smoked. He has never used smokeless tobacco. He reports that he drinks alcohol. He reports that he does not use drugs. family history includes Cancer in his brother, cousin, father, and mother; Diabetes in his father; Hypertension in his father; Pancreatic cancer in his mother; Prostate cancer in his father and paternal uncle. No Known Allergies Current Outpatient Medications on File Prior to Visit  Medication Sig Dispense Refill  . aspirin 81 MG tablet Take 81 mg by mouth daily.      . clopidogrel (PLAVIX) 75 MG tablet Take 1 tablet (75 mg total) by mouth daily. 90 tablet 3  . ezetimibe (ZETIA) 10 MG tablet Take 1 tablet (10 mg total) by mouth daily. Please keep upcoming appt for future refills. Thank you 90 tablet 3  . furosemide (LASIX) 40 MG tablet Take 40 mg by mouth daily.  0  . hydrALAZINE (APRESOLINE) 25 MG tablet Take 0.5 tablets (12.5 mg total) by mouth 3 (three) times daily. 120 tablet  3  . isosorbide mononitrate (IMDUR) 30 MG 24 hr tablet Take 1 tablet (30 mg total) by mouth daily. 90 tablet 3  . lisinopril (PRINIVIL,ZESTRIL) 40 MG tablet Take 1 tablet by mouth every day. Please keep upcoming appt for future refills. Thank you 90 tablet 3  . metFORMIN (GLUCOPHAGE) 1000 MG tablet take 1 tablet by mouth twice a day with food 180 tablet 1  . Multiple Vitamins-Minerals (MULTIVITAMIN WITH MINERALS) tablet Take 1 tablet by mouth daily.      . rosuvastatin (CRESTOR) 40 MG tablet Take 1 tablet (40 mg total) by mouth daily.  Please keep upcoming appt for future refills. Thank you 90 tablet 3  . spironolactone (ALDACTONE) 25 MG tablet Take 0.5 tablets (12.5 mg total) by mouth daily. 45 tablet 3  . tetrahydrozoline-zinc (VISINE-AC) 0.05-0.25 % ophthalmic solution Place 1 drop into both eyes daily as needed (allergy irritation.).     No current facility-administered medications on file prior to visit.    Review of Systems  Constitutional: Negative for other unusual diaphoresis or sweats HENT: Negative for ear discharge or swelling Eyes: Negative for other worsening visual disturbances Respiratory: Negative for stridor or other swelling  Gastrointestinal: Negative for worsening distension or other blood Genitourinary: Negative for retention or other urinary change Musculoskeletal: Negative for other MSK pain or swelling Skin: Negative for color change or other new lesions Neurological: Negative for worsening tremors and other numbness  Psychiatric/Behavioral: Negative for worsening agitation or other fatigue All other system neg Jesse pt    Objective:   Physical Exam BP 116/76   Pulse 80   Temp 97.8 F (36.6 C) (Oral)   Ht 5\' 8"  (1.727 m)   Wt 193 lb (87.5 kg)   SpO2 98%   BMI 29.35 kg/m  VS noted,  Constitutional: Pt appears in NAD HENT: Head: NCAT.  Right Ear: External ear normal.  Left Ear: External ear normal.  Eyes: . Pupils are equal, round, and reactive to light. Conjunctivae and EOM are normal Nose: without d/c or deformity Neck: Neck supple. Gross normal ROM Cardiovascular: Normal rate and regular rhythm.   Pulmonary/Chest: Effort normal and breath sounds without rales or wheezing.  Abd:  Soft, NT, ND, + BS, no organomegaly Neurological: Pt is alert. At baseline orientation, motor grossly intact Skin: Skin is warm. No rashes, other new lesions, no LE edema Psychiatric: Pt behavior is normal without agitation  No other exam findings  POCT glycosylated hemoglobin (Hb A1C) .Marland Kitchen  Component  11:22  Hemoglobin A1C 5.2            Assessment & Plan:

## 2017-08-18 ENCOUNTER — Other Ambulatory Visit: Payer: Self-pay | Admitting: Cardiology

## 2017-08-18 DIAGNOSIS — I2510 Atherosclerotic heart disease of native coronary artery without angina pectoris: Secondary | ICD-10-CM

## 2017-08-19 ENCOUNTER — Other Ambulatory Visit: Payer: Self-pay | Admitting: Internal Medicine

## 2017-08-20 ENCOUNTER — Other Ambulatory Visit: Payer: Self-pay | Admitting: Cardiology

## 2017-08-20 DIAGNOSIS — I2510 Atherosclerotic heart disease of native coronary artery without angina pectoris: Secondary | ICD-10-CM

## 2017-09-17 DIAGNOSIS — H401122 Primary open-angle glaucoma, left eye, moderate stage: Secondary | ICD-10-CM | POA: Diagnosis not present

## 2017-09-17 DIAGNOSIS — H401111 Primary open-angle glaucoma, right eye, mild stage: Secondary | ICD-10-CM | POA: Diagnosis not present

## 2017-09-17 DIAGNOSIS — E119 Type 2 diabetes mellitus without complications: Secondary | ICD-10-CM | POA: Diagnosis not present

## 2017-09-17 DIAGNOSIS — H25813 Combined forms of age-related cataract, bilateral: Secondary | ICD-10-CM | POA: Diagnosis not present

## 2017-09-17 LAB — HM DIABETES EYE EXAM

## 2017-09-19 ENCOUNTER — Other Ambulatory Visit: Payer: Self-pay | Admitting: Cardiology

## 2018-01-18 ENCOUNTER — Other Ambulatory Visit: Payer: Self-pay | Admitting: Physician Assistant

## 2018-02-17 ENCOUNTER — Ambulatory Visit: Payer: Medicare HMO | Admitting: Internal Medicine

## 2018-02-17 DIAGNOSIS — Z0289 Encounter for other administrative examinations: Secondary | ICD-10-CM

## 2018-02-19 ENCOUNTER — Other Ambulatory Visit: Payer: Self-pay | Admitting: Internal Medicine

## 2018-02-27 ENCOUNTER — Other Ambulatory Visit: Payer: Self-pay | Admitting: Cardiology

## 2018-02-27 DIAGNOSIS — I10 Essential (primary) hypertension: Secondary | ICD-10-CM

## 2018-05-18 ENCOUNTER — Other Ambulatory Visit: Payer: Self-pay | Admitting: Internal Medicine

## 2018-08-09 ENCOUNTER — Other Ambulatory Visit: Payer: Self-pay | Admitting: Cardiology

## 2018-08-31 ENCOUNTER — Other Ambulatory Visit: Payer: Self-pay | Admitting: Internal Medicine

## 2018-08-31 ENCOUNTER — Other Ambulatory Visit: Payer: Self-pay | Admitting: Cardiology

## 2018-08-31 DIAGNOSIS — I2510 Atherosclerotic heart disease of native coronary artery without angina pectoris: Secondary | ICD-10-CM

## 2018-09-18 ENCOUNTER — Other Ambulatory Visit: Payer: Self-pay | Admitting: Cardiology

## 2018-09-21 ENCOUNTER — Telehealth: Payer: Self-pay

## 2018-09-21 NOTE — Telephone Encounter (Signed)
lpmtcb regarding Virtual Visit 6/18

## 2018-09-21 NOTE — Telephone Encounter (Signed)
New Message     *STAT* If patient is at the pharmacy, call can be transferred to refill team.   1. Which medications need to be refilled? (please list name of each medication and dose if known) Isosorbide Mononitrate 30mg  1 tablet by mouth daily  2. Which pharmacy/location (including street and city if local pharmacy) is medication to be sent to? [facility] on [address]   3. Do they need a 30 day or 90 day supply? 90 day Supply

## 2018-09-22 NOTE — Telephone Encounter (Signed)
   TELEPHONE CALL NOTE  This patient has been deemed a candidate for follow-up tele-health visit to limit community exposure during the Covid-19 pandemic. I spoke with the patient via phone to discuss instructions.  A Virtual Office Visit appointment type has been scheduled for 6/18 with Lyda Jester, PA, with "VIDEO" or "TELEPHONE" in the appointment notes - patient prefers Phone type.   Frederik Schmidt, RN 09/22/2018 8:49 AM   Patient has consented to a Virtual Visit by Phone on 6/18 with Lyda Jester, PA.

## 2018-09-24 ENCOUNTER — Encounter: Payer: Self-pay | Admitting: Cardiology

## 2018-09-24 ENCOUNTER — Telehealth (INDEPENDENT_AMBULATORY_CARE_PROVIDER_SITE_OTHER): Payer: Medicare HMO | Admitting: Cardiology

## 2018-09-24 ENCOUNTER — Other Ambulatory Visit: Payer: Self-pay

## 2018-09-24 ENCOUNTER — Telehealth: Payer: Self-pay | Admitting: *Deleted

## 2018-09-24 VITALS — Ht 68.0 in | Wt 185.0 lb

## 2018-09-24 DIAGNOSIS — I10 Essential (primary) hypertension: Secondary | ICD-10-CM

## 2018-09-24 DIAGNOSIS — Z5181 Encounter for therapeutic drug level monitoring: Secondary | ICD-10-CM | POA: Diagnosis not present

## 2018-09-24 DIAGNOSIS — E785 Hyperlipidemia, unspecified: Secondary | ICD-10-CM | POA: Diagnosis not present

## 2018-09-24 DIAGNOSIS — I2510 Atherosclerotic heart disease of native coronary artery without angina pectoris: Secondary | ICD-10-CM

## 2018-09-24 MED ORDER — SPIRONOLACTONE 25 MG PO TABS: ORAL_TABLET | ORAL | 3 refills | Status: DC

## 2018-09-24 MED ORDER — LISINOPRIL 40 MG PO TABS: ORAL_TABLET | ORAL | 3 refills | Status: DC

## 2018-09-24 MED ORDER — CLOPIDOGREL BISULFATE 75 MG PO TABS: ORAL_TABLET | ORAL | 3 refills | Status: DC

## 2018-09-24 MED ORDER — ISOSORBIDE MONONITRATE ER 30 MG PO TB24: 30.0000 mg | ORAL_TABLET | Freq: Every day | ORAL | 3 refills | Status: DC

## 2018-09-24 MED ORDER — CARVEDILOL 12.5 MG PO TABS: ORAL_TABLET | ORAL | 3 refills | Status: DC

## 2018-09-24 MED ORDER — EZETIMIBE 10 MG PO TABS: 10.0000 mg | ORAL_TABLET | Freq: Every day | ORAL | 3 refills | Status: DC

## 2018-09-24 MED ORDER — HYDRALAZINE HCL 25 MG PO TABS: 12.5000 mg | ORAL_TABLET | Freq: Three times a day (TID) | ORAL | 3 refills | Status: DC

## 2018-09-24 NOTE — Telephone Encounter (Signed)
Lvm on patient cell number to contact clinic back to make appointment. Unable to leave a message on home number

## 2018-09-24 NOTE — Patient Instructions (Signed)
Medication Instructions:  Your physician recommends that you continue on your current medications as directed. Please refer to the Current Medication list given to you today.  If you need a refill on your cardiac medications before your next appointment, please call your pharmacy.   Lab work. basic metabolic panel, CBC, hepatic function test and fasting lipid panel   If you have labs (blood work) drawn today and your tests are completely normal, you will receive your results only by: Marland Kitchen MyChart Message (if you have MyChart) OR . A paper copy in the mail If you have any lab test that is abnormal or we need to change your treatment, we will call you to review the results.  Testing/Procedures: NONE ORDERED  TODAY  Follow-Up: At [facility], you and your health needs are our priority.  As part of our continuing mission to provide you with exceptional heart care, we have created designated Provider Care Teams.  These Care Teams include your primary Cardiologist (physician) and Advanced Practice Providers (APPs -  Physician Assistants and Nurse Practitioners) who all work together to provide you with the care you need, when you need it. You will need a follow up appointment in 6 months.  Please call our office 2 months in advance to schedule this appointment.  You may see. Dr.Turner or one of the following Advanced Practice Providers on your designated Care Team:   [place], PA-C Melina Copa, PA-C . Ermalinda Barrios, PA-C  Any Other Special Instructions Will Be Listed Below (If Applicable).

## 2018-09-24 NOTE — Progress Notes (Signed)
Virtual Visit via Telephone Note   This visit type was conducted due to national recommendations for restrictions regarding the COVID-19 Pandemic (e.g. social distancing) in an effort to limit this patient's exposure and mitigate transmission in our community.  Due to his co-morbid illnesses, this patient is at least at moderate risk for complications without adequate follow up.  This format is felt to be most appropriate for this patient at this time.  The patient did not have access to video technology/had technical difficulties with video requiring transitioning to audio format only (telephone).  All issues noted in this document were discussed and addressed.  No physical exam could be performed with this format.  Please refer to the patient's chart for his  consent to telehealth for [facility].   Date:  09/24/2018   ID:  Jesse Woods, DOB 11-16-1950, MRN [id number]  Patient Location: Home Provider Location: Home  PCP:  Biagio Borg, MD  Cardiologist:  Dr. Radford Pax Electrophysiologist:  None   Evaluation Performed:  Follow-Up Visit  Chief Complaint: Follow-up for CAD and ischemic cardiomyopathy  History of Present Illness:    Jesse Woods is a 68 y.o. male with a hx of HTN, HL, DM2, remote history of subarachnoid hemorrhage, CAD and ischemic cardiomyopathy. He was noted to have chronic total occlusion of mid LCx and severely diseased RCA on previous cath in February 2016. EF at that time was 15%. A FUecho in August 2016 showed EF had improved to 35-40%. He was cleared by neurosurgery for DAPT and then wasseen by Dr. Irish Lack to proceed with PCI of RCA. LHC 12/22/2014 showed 90% proximal to mid RCA lesion that treated with 3.0 x 32 mm Synergy DES. He had residual chronic total occlusion of mid left circumflex with left to left collaterals. Normal LVEDP 5 mmHg. His ACE I was changed to [facility] but unfortunately, his insurance did not cover Entresto and it was too expensive. He  is back on Lisinopril 20 bid.  He is followed by Dr. Radford Pax.  Last office visit was April 2019.  He was doing well at that time without any cardiac symptoms.  A repeat echocardiogram was ordered which showed normalization of EF at 60 to 65%, with mild LVH and trivial AR.  Pt reports that they have done well from a symptom standpoint since their last office visit. He denies chest pain and dyspnea. No exertional symptoms w/ exercise or ADLs. Pt also denies orthopnea, PND, LEE, palpitations, dizziness, syncope/ near syncope and claudication.   He reports full medication compliance. No intolerances or medication side effects. Pt also denies any hospitalizations,  ED/urgent care visits or surgeries since their last office visit.  Unfortunately, he does not have a blood pressure cuff at home, but reports his BP is typically well controlled.   The patient does not have symptoms concerning for COVID-19 infection (fever, chills, cough, or new shortness of breath).    Past Medical History:  Diagnosis Date  . Arthritis    "back and left hip" (12/22/2014)  . Chronic systolic CHF (congestive heart failure), NYHA class 2 ([place])   . Coronary artery disease 01/2015   chronic total occlusion of mid LCx and severely diseased RCA on previous cath in February 2016. EF at that time was 15%.He was cleared by neurosurgery for DAPT and then wasseen by Dr. Irish Lack to proceed with PCI of RCA. LHC 12/22/2014 showed 90% proximal to mid RCA lesion that treated with 3.0 x 32 mm Synergy DES  .  DCM (dilated cardiomyopathy) ([place]) 06/22/2014   EF was 15% but now 35-40% after PCI  . Diabetes mellitus, type II ([place])   . Hyperlipidemia   . Hypertension   . Subarachnoid hemorrhage ([place]) 1998   from HTN   Past Surgical History:  Procedure Laterality Date  . BRAIN SURGERY  1998   right frontal ventriculotomy with subsequent ventriculo-abfdominal shunt due to [facility]  . CARDIAC CATHETERIZATION N/A 12/22/2014   Procedure: Coronary  Stent Intervention;  Surgeon: Jettie Booze, MD;  Location: [place] CV LAB;  Service: Cardiovascular;  Laterality: N/A;  . CARDIAC CATHETERIZATION  12/22/2014   Procedure: Left Heart Cath and Coronary Angiography;  Surgeon: Jettie Booze, MD;  Location: [place] CV LAB;  Service: Cardiovascular;;  . CARDIAC CATHETERIZATION  06/24/2014  . CORONARY ANGIOPLASTY    . CSF SHUNT  1998  . INGUINAL HERNIA REPAIR Right 1954  . LEFT HEART CATHETERIZATION WITH CORONARY ANGIOGRAM N/A 06/24/2014   Procedure: LEFT HEART CATHETERIZATION WITH CORONARY ANGIOGRAM;  Surgeon: Jettie Booze, MD;  Location: [facility] CATH LAB;  Service: Cardiovascular;  Laterality: N/A;     Current Meds  Medication Sig  . aspirin 81 MG tablet Take 81 mg by mouth daily.    . carvedilol (COREG) 12.5 MG tablet Take 1 tablets by mouth twice a day.  . clopidogrel (PLAVIX) 75 MG tablet TAKE 1 TABLET(75 MG) BY MOUTH DAILY  . ezetimibe (ZETIA) 10 MG tablet TAKE 1 TABLET BY MOUTH DAILY  . furosemide (LASIX) 40 MG tablet Take 40 mg by mouth daily.  . hydrALAZINE (APRESOLINE) 25 MG tablet Take 0.5 tablets (12.5 mg total) by mouth 3 (three) times daily.  . isosorbide mononitrate (IMDUR) 30 MG 24 hr tablet Take 1 tablet (30 mg total) by mouth daily. Please keep your appt schedule on 09/24/18 with our office.  Marland Kitchen lisinopril (ZESTRIL) 40 MG tablet TAKE 1 TABLET BY MOUTH EVERY DAY  . metFORMIN (GLUCOPHAGE) 1000 MG tablet TAKE 1 TABLET BY MOUTH TWICE A DAY WITH FOOD  . Multiple Vitamins-Minerals (MULTIVITAMIN WITH MINERALS) tablet Take 1 tablet by mouth daily.    . nitroGLYCERIN (NITROSTAT) 0.4 MG SL tablet Place 1 tablet (0.4 mg total) under the tongue every 5 (five) minutes as needed for chest pain (x 3 tabs).  . rosuvastatin (CRESTOR) 40 MG tablet TAKE 1 TABLET BY MOUTH DAILY  . spironolactone (ALDACTONE) 25 MG tablet TAKE 1/2 TABLET(12.5 MG) BY MOUTH DAILY  . tetrahydrozoline-zinc (VISINE-AC) 0.05-0.25 % ophthalmic solution  Place 1 drop into both eyes daily as needed (allergy irritation.).     Allergies:   Patient has no known allergies.   Social History   Tobacco Use  . Smoking status: Never Smoker  . Smokeless tobacco: Never Used  Substance Use Topics  . Alcohol use: Yes    Comment: 12/22/2014 "no alcohol since 1998"  . Drug use: No     Family Hx: The patient's family history includes Cancer in his brother, cousin, father, and mother; Diabetes in his father; Hypertension in his father; Pancreatic cancer in his mother; Prostate cancer in his father and paternal uncle.  ROS:   Please see the history of present illness.     All other systems reviewed and are negative.   Prior CV studies:   The following studies were reviewed today:  2D echo 07/2017 Study Conclusions  - Left ventricle: The cavity size was normal. Wall thickness was   increased in a pattern of mild LVH. Systolic function was normal.  The estimated ejection fraction was in the range of 60% to 65%.   Wall motion was normal; there were no regional wall motion   abnormalities. Doppler parameters are consistent with abnormal   left ventricular relaxation (grade 1 diastolic dysfunction). - Aortic valve: There was trivial regurgitation. - Pulmonary arteries: Systolic pressure was mildly increased. PA   peak pressure: 32 mm Hg (S).   Labs/Other Tests and Data Reviewed:    EKG:  An ECG dated 07/24/2017 was personally reviewed today and demonstrated:  Normal sinus rhythm, 92 bpm  Recent Labs: No results found for requested labs within last 8760 hours.   Recent Lipid Panel Lab Results  Component Value Date/Time   CHOL 113 02/12/2017 03:16 PM   TRIG 152.0 (H) 02/12/2017 03:16 PM   HDL 30.80 (L) 02/12/2017 03:16 PM   CHOLHDL 4 02/12/2017 03:16 PM   LDLCALC 52 02/12/2017 03:16 PM   LDLDIRECT 215.3 01/03/2011 10:35 AM    Wt Readings from Last 3 Encounters:  09/24/18 185 lb (83.9 kg)  08/14/17 193 lb (87.5 kg)  07/24/17 197 lb  (89.4 kg)     Objective:    Vital Signs:  Ht 5\' 8"  (1.727 m)   Wt 185 lb (83.9 kg)   BMI 28.13 kg/m    Physical exam: Pleasant, well sounding male in no acute distress.  Speaking in clear complete sentences.  Speech unlabored.  Breathing unlabored.  ASSESSMENT & PLAN:    1.  CAD: LHC 12/22/2014 showed 90% proximal to mid RCA lesion that treated with 3.0 x 32 mm Synergy DES. He had residual chronic total occlusion of mid left circumflex with left to left collaterals.  He is doing well and denies anginal symptomatology.  Continue medical therapy with aspirin, Plavix, beta-blocker, statin, ACE inhibitor and long-acting nitrate.  We discussed heart healthy diet and increasing physical activity.  3.  H/o Ischemic cardiomyopathy: EF in 2016 was as low as 15% in Feb 2016 and had improved to 35-40% on f/u echo Aug 2016.  His most recent echocardiogram April 2019 showed normalization of EF at 60 to 65%.  He reports that he feels great.  No dyspnea or decreased exercise tolerance.  No lower extremity edema, orthopnea or PND.  Continue medical therapy with beta-blocker, ACE inhibitor, spironolactone, nitrate and hydralazine.  4.  Hypertension: He reports full medication compliance.  Unfortunately he does not have a home blood pressure cuff at home but reports that his blood pressure traditionally has been well controlled.  He denies any symptoms.  Continue current medications.  We will order a basic metabolic panel to check renal function and electrolytes given he is on spironolactone and lisinopril.  5.  Hyperlipidemia: Reports compliance with Crestor and Zetia.  Denies any side effects.  Has CAD.  LDL goal is less than 70 mg/dL.  We will order fasting lipid panel hepatic function test.    COVID-19 Education: The signs and symptoms of COVID-19 were discussed with the patient and how to seek care for testing (follow up with PCP or arrange E-visit).  The importance of social distancing was discussed  today.  Time:   Today, I have spent 15 minutes with the patient with telehealth technology discussing the above problems.     Medication Adjustments/Labs and Tests Ordered: Current medicines are reviewed at length with the patient today.  Concerns regarding medicines are outlined above.   Tests Ordered: BMP HFT CBC FLP   Medication Changes: None    Follow Up:  Virtual  Visit or In Person in 6 month(s) with Dr. Radford Pax  Signed, Lyda Jester, PA-C  09/24/2018 9:00 AM    [place]

## 2018-10-19 ENCOUNTER — Telehealth: Payer: Self-pay | Admitting: Internal Medicine

## 2018-10-19 NOTE — Telephone Encounter (Signed)
Medication Refill - Medication: metFORMIN (GLUCOPHAGE) 1000 MG tablet   Has the patient contacted their pharmacy? Yes.   (Agent: If yes, when and what did the pharmacy advise?) Pt stated that the pharmacy told him that they could not contact the office.  Preferred Pharmacy (with phone number or street name):  [id number] [address], [place] [phone] (Phone) [phone] (Fax)     Agent: Please be advised that RX refills may take up to 3 business days. We ask that you follow-up with your pharmacy.

## 2018-10-20 MED ORDER — METFORMIN HCL 1000 MG PO TABS: 1000.0000 mg | ORAL_TABLET | Freq: Two times a day (BID) | ORAL | 1 refills | Status: DC

## 2018-12-08 ENCOUNTER — Telehealth: Payer: Self-pay | Admitting: Internal Medicine

## 2018-12-08 NOTE — Telephone Encounter (Signed)
Pt called in to update provider that he just got his flu shot at the pharmacy.

## 2019-02-02 DIAGNOSIS — K006 Disturbances in tooth eruption: Secondary | ICD-10-CM | POA: Diagnosis not present

## 2019-02-02 DIAGNOSIS — R69 Illness, unspecified: Secondary | ICD-10-CM | POA: Diagnosis not present

## 2019-03-23 ENCOUNTER — Other Ambulatory Visit: Payer: Self-pay

## 2019-03-23 ENCOUNTER — Encounter: Payer: Self-pay | Admitting: Cardiology

## 2019-03-23 ENCOUNTER — Telehealth (INDEPENDENT_AMBULATORY_CARE_PROVIDER_SITE_OTHER): Payer: Medicare HMO | Admitting: Cardiology

## 2019-03-23 VITALS — Ht 68.0 in | Wt 184.0 lb

## 2019-03-23 DIAGNOSIS — I255 Ischemic cardiomyopathy: Secondary | ICD-10-CM | POA: Diagnosis not present

## 2019-03-23 DIAGNOSIS — I2510 Atherosclerotic heart disease of native coronary artery without angina pectoris: Secondary | ICD-10-CM | POA: Diagnosis not present

## 2019-03-23 DIAGNOSIS — I10 Essential (primary) hypertension: Secondary | ICD-10-CM

## 2019-03-23 DIAGNOSIS — E785 Hyperlipidemia, unspecified: Secondary | ICD-10-CM | POA: Diagnosis not present

## 2019-03-23 DIAGNOSIS — I5032 Chronic diastolic (congestive) heart failure: Secondary | ICD-10-CM

## 2019-03-23 NOTE — Patient Instructions (Addendum)
Medication Instructions:  Your physician recommends that you continue on your current medications as directed. Please refer to the Current Medication list given to you today.  *If you need a refill on your cardiac medications before your next appointment, please call your pharmacy*  Lab Work: You will have a CMET and fasting lipid panel drawn (12/17).  If you have labs (blood work) drawn today and your tests are completely normal, you will receive your results only by: Marland Kitchen MyChart Message (if you have MyChart) OR . A paper copy in the mail If you have any lab test that is abnormal or we need to change your treatment, we will call you to review the results.  Follow-Up: At [facility], you and your health needs are our priority.  As part of our continuing mission to provide you with exceptional heart care, we have created designated Provider Care Teams.  These Care Teams include your primary Cardiologist (physician) and Advanced Practice Providers (APPs -  Physician Assistants and Nurse Practitioners) who all work together to provide you with the care you need, when you need it.  Your next appointment:   6 month(s)  The format for your next appointment:   Either In Person or Virtual  Provider:   Melina Copa, PA-C or Ermalinda Barrios, PA-C   Follow up with Dr. Radford Pax in 1 year.

## 2019-03-23 NOTE — Progress Notes (Signed)
Virtual Visit via telephone Note   This visit type was conducted due to national recommendations for restrictions regarding the COVID-19 Pandemic (e.g. social distancing) in an effort to limit this patient's exposure and mitigate transmission in our community.  Due to his co-morbid illnesses, this patient is at least at moderate risk for complications without adequate follow up.  This format is felt to be most appropriate for this patient at this time.  All issues noted in this document were discussed and addressed.  A limited physical exam was performed with this format.  Please refer to the patient's chart for his consent to telehealth for [facility].   Evaluation Performed:  Follow-up visit  This visit type was conducted due to national recommendations for restrictions regarding the COVID-19 Pandemic (e.g. social distancing).  This format is felt to be most appropriate for this patient at this time.  All issues noted in this document were discussed and addressed.  No physical exam was performed (except for noted visual exam findings with Video Visits).  Please refer to the patient's chart (MyChart message for video visits and phone note for telephone visits) for the patient's consent to telehealth for [facility].  Date:  03/23/2019   ID:  Jesse Woods, DOB [birth year], MRN [id number]  Patient Location:  Home  Provider location:   [place]  PCP:  Biagio Borg, MD  Cardiologist:  Fransico Him, MD  Electrophysiologist:  None   Chief Complaint:  CAD, HTN, HLD, CHF  History of Present Illness:    Jesse Woods is a 68 y.o. male who presents via audio/video conferencing for a telehealth visit today.    Jesse Woods is a 68 y.o. male with a hx of HTN, HL, DM2, remote history of subarachnoid hemorrhage, CAD and ischemic cardiomyopathy. He was noted to have chronic total occlusion of mid LCx and severely diseased RCA on previous cath in February 2016. EF at that time was  15%. A FUecho in August 2016 showed EF had improved to 35-40%. LHC 12/22/2014 showed 90% proximal to mid RCA lesion that treated with 3.0 x 32 mm Synergy DES. He had residual chronic total occlusion of mid left circumflex with left to left collaterals.  His ACE I was changed to [facility] but unfortunately, his insurance did not cover Entresto and it was too expensive and changed to ACE I.His last echo in 2019 showed normalization of EF at 60%.   He is here today for followup and is doing well.  He denies any chest pain or pressure, SOB, DOE, PND, orthopnea, LE edema, dizziness, palpitations or syncope. He is compliant with his meds and is tolerating meds with no SE.    The patient does not have symptoms concerning for COVID-19 infection (fever, chills, cough, or new shortness of breath).    Prior CV studies:   The following studies were reviewed today:  none  Past Medical History:  Diagnosis Date  . Arthritis    "back and left hip" (12/22/2014)  . Chronic diastolic (congestive) heart failure ([place])   . Coronary artery disease 01/2015   chronic total occlusion of mid LCx and severely diseased RCA on previous cath in February 2016. EF at that time was 15%.He was cleared by neurosurgery for DAPT and then wasseen by Dr. Irish Lack to proceed with PCI of RCA. LHC 12/22/2014 showed 90% proximal to mid RCA lesion that treated with 3.0 x 32 mm Synergy DES  . DCM (dilated cardiomyopathy) ([place]) 06/22/2014  EF was 15% but now 35-40% after PCI. Echo 2019 with EF 60-65% with G1DD  . Diabetes mellitus, type II ([place])   . Hyperlipidemia   . Hypertension   . Subarachnoid hemorrhage ([place]) 1998   from HTN   Past Surgical History:  Procedure Laterality Date  . BRAIN SURGERY  1998   right frontal ventriculotomy with subsequent ventriculo-abfdominal shunt due to [facility]  . CARDIAC CATHETERIZATION N/A 12/22/2014   Procedure: Coronary Stent Intervention;  Surgeon: Jettie Booze, MD;  Location: [place]  CV LAB;  Service: Cardiovascular;  Laterality: N/A;  . CARDIAC CATHETERIZATION  12/22/2014   Procedure: Left Heart Cath and Coronary Angiography;  Surgeon: Jettie Booze, MD;  Location: [place] CV LAB;  Service: Cardiovascular;;  . CARDIAC CATHETERIZATION  06/24/2014  . CORONARY ANGIOPLASTY    . CSF SHUNT  1998  . INGUINAL HERNIA REPAIR Right 1954  . LEFT HEART CATHETERIZATION WITH CORONARY ANGIOGRAM N/A 06/24/2014   Procedure: LEFT HEART CATHETERIZATION WITH CORONARY ANGIOGRAM;  Surgeon: Jettie Booze, MD;  Location: [facility] CATH LAB;  Service: Cardiovascular;  Laterality: N/A;     Current Meds  Medication Sig  . aspirin 81 MG tablet Take 81 mg by mouth daily.    . carvedilol (COREG) 12.5 MG tablet Take 1 tablets by mouth twice a day.  . clopidogrel (PLAVIX) 75 MG tablet TAKE 1 TABLET(75 MG) BY MOUTH DAILY  . ezetimibe (ZETIA) 10 MG tablet Take 1 tablet (10 mg total) by mouth daily.  Marland Kitchen FLUAD QUADRIVALENT 0.5 ML injection   . furosemide (LASIX) 40 MG tablet Take 40 mg by mouth daily.  . hydrALAZINE (APRESOLINE) 25 MG tablet Take 0.5 tablets (12.5 mg total) by mouth 3 (three) times daily.  Marland Kitchen HYDROcodone-acetaminophen (NORCO/VICODIN) 5-325 MG tablet   . isosorbide mononitrate (IMDUR) 30 MG 24 hr tablet Take 1 tablet (30 mg total) by mouth daily.  Marland Kitchen lisinopril (ZESTRIL) 40 MG tablet TAKE 1 TABLET BY MOUTH EVERY DAY  . metFORMIN (GLUCOPHAGE) 1000 MG tablet Take 1 tablet (1,000 mg total) by mouth 2 (two) times daily with a meal.  . Multiple Vitamins-Minerals (MULTIVITAMIN WITH MINERALS) tablet Take 1 tablet by mouth daily.    . nitroGLYCERIN (NITROSTAT) 0.4 MG SL tablet Place 1 tablet (0.4 mg total) under the tongue every 5 (five) minutes as needed for chest pain (x 3 tabs).  . rosuvastatin (CRESTOR) 40 MG tablet TAKE 1 TABLET BY MOUTH DAILY  . spironolactone (ALDACTONE) 25 MG tablet TAKE 1/2 TABLET(12.5 MG) BY MOUTH DAILY  . tetrahydrozoline-zinc (VISINE-AC) 0.05-0.25 % ophthalmic  solution Place 1 drop into both eyes daily as needed (allergy irritation.).     Allergies:   Patient has no known allergies.   Social History   Tobacco Use  . Smoking status: Never Smoker  . Smokeless tobacco: Never Used  Substance Use Topics  . Alcohol use: Yes    Comment: 12/22/2014 "no alcohol since 1998"  . Drug use: No     Family Hx: The patient's family history includes Cancer in his brother, cousin, father, and mother; Diabetes in his father; Hypertension in his father; Pancreatic cancer in his mother; Prostate cancer in his father and paternal uncle.  ROS:   Please see the history of present illness.     All other systems reviewed and are negative.   Labs/Other Tests and Data Reviewed:    Recent Labs: No results found for requested labs within last 8760 hours.   Recent Lipid Panel Lab Results  Component Value Date/Time   CHOL 113 02/12/2017 03:16 PM   TRIG 152.0 (H) 02/12/2017 03:16 PM   HDL 30.80 (L) 02/12/2017 03:16 PM   CHOLHDL 4 02/12/2017 03:16 PM   LDLCALC 52 02/12/2017 03:16 PM   LDLDIRECT 215.3 01/03/2011 10:35 AM    Wt Readings from Last 3 Encounters:  03/23/19 184 lb (83.5 kg)  09/24/18 185 lb (83.9 kg)  08/14/17 193 lb (87.5 kg)     Objective:    Vital Signs:  Ht 5\' 8"  (1.727 m)   Wt 184 lb (83.5 kg)   BMI 27.98 kg/m     ASSESSMENT & PLAN:    1.  ASCAD -cath showed chronic total occlusion of mid LCx and severely diseased RCA on cath in February 2016. EF at that time was 15%. A FUecho in August 2016 showed EF had improved to 35-40%. LHC 12/22/2014 showed 90% proximal to mid RCA lesion that treated with 3.0 x 32 mm Synergy DES. He had residual chronic total occlusion of mid left circumflex with left to left collaterals.  -he has not had any anginal sx -continue ASA 81mg  daily, Plavix 75mg  daily, Carvedilol, Imdur 30mg  daily and statin  2.  Chronic diastolic  CHF -his weight is stable -he denies any SOB or LE edema -continue  Carvedilol, ACE I, Hydralazine, Imdur, Spiro and lasix 40mg  daily  3.  Ischemic DCM -EF in 2016 was as low as 15% in Feb 2016  -EF improved to 35-40% on f/u echo Aug 2016.   -His most recent echocardiogram April 2019 showed normalization of EF at 60 to 65%.  4.  HTN -continue Carvedilol 12mg  BID, Hydralazine 12.5mg  TID, Lisnipril 40mg  daily and Spiro 12.5mg  daily.  -check BMET  5.  HLD -LDL goal < 70 -I will get an FLP and ALT -continue Crestor 40mg  daily and Zetia 10mg  daily  COVID-19 Education: The signs and symptoms of COVID-19 were discussed with the patient and how to seek care for testing (follow up with PCP or arrange E-visit).  The importance of social distancing was discussed today.  Patient Risk:   After full review of this patient's clinical status, I feel that they are at least moderate risk at this time.  Time:   Today, I have spent 20 minutes directly with the patient on telemedicine discussing medical problems including CAD< HTN, HLD, CHF.  We also reviewed the symptoms of COVID 19 and the ways to protect against contracting the virus with telehealth technology.  I spent an additional 5 minutes reviewing patient's chart including labs.  Medication Adjustments/Labs and Tests Ordered: Current medicines are reviewed at length with the patient today.  Concerns regarding medicines are outlined above.  Tests Ordered: No orders of the defined types were placed in this encounter.  Medication Changes: No orders of the defined types were placed in this encounter.   Disposition:  Follow up in 6 month(s) with PA and 1 year with TT  Signed, Fransico Him, MD  03/23/2019 1:34 PM    [facility]

## 2019-03-25 ENCOUNTER — Other Ambulatory Visit: Payer: Medicare HMO | Admitting: *Deleted

## 2019-03-25 ENCOUNTER — Other Ambulatory Visit: Payer: Self-pay

## 2019-03-25 DIAGNOSIS — E785 Hyperlipidemia, unspecified: Secondary | ICD-10-CM

## 2019-03-25 DIAGNOSIS — I255 Ischemic cardiomyopathy: Secondary | ICD-10-CM | POA: Diagnosis not present

## 2019-03-25 DIAGNOSIS — I10 Essential (primary) hypertension: Secondary | ICD-10-CM | POA: Diagnosis not present

## 2019-03-25 DIAGNOSIS — I5032 Chronic diastolic (congestive) heart failure: Secondary | ICD-10-CM

## 2019-03-25 DIAGNOSIS — I2510 Atherosclerotic heart disease of native coronary artery without angina pectoris: Secondary | ICD-10-CM | POA: Diagnosis not present

## 2019-03-25 LAB — LIPID PANEL
Chol/HDL Ratio: 2.8 ratio (ref 0.0–5.0)
Cholesterol, Total: 103 mg/dL (ref 100–199)
HDL: 37 mg/dL — ABNORMAL LOW (ref >39)
LDL Chol Calc (NIH): 44 mg/dL (ref 0–99)
Triglycerides: 119 mg/dL (ref 0–149)
VLDL Cholesterol Cal: 22 mg/dL (ref 5–40)

## 2019-03-25 LAB — COMPREHENSIVE METABOLIC PANEL
ALT: 34 IU/L (ref 0–44)
AST: 42 IU/L — ABNORMAL HIGH (ref 0–40)
Albumin/Globulin Ratio: 1.6 (ref 1.2–2.2)
Albumin: 4.4 g/dL (ref 3.8–4.8)
Alkaline Phosphatase: 51 IU/L (ref 39–117)
BUN/Creatinine Ratio: 7 — ABNORMAL LOW (ref 10–24)
BUN: 10 mg/dL (ref 8–27)
Bilirubin Total: 0.6 mg/dL (ref 0.0–1.2)
CO2: 21 mmol/L (ref 20–29)
Calcium: 9.4 mg/dL (ref 8.6–10.2)
Chloride: 105 mmol/L (ref 96–106)
Creatinine, Ser: 1.36 mg/dL — ABNORMAL HIGH (ref 0.76–1.27)
GFR calc Af Amer: 61 mL/min/{1.73_m2} (ref >59)
GFR calc non Af Amer: 52 mL/min/{1.73_m2} — ABNORMAL LOW (ref >59)
Globulin, Total: 2.7 g/dL (ref 1.5–4.5)
Glucose: 103 mg/dL — ABNORMAL HIGH (ref 65–99)
Potassium: 4.3 mmol/L (ref 3.5–5.2)
Sodium: 142 mmol/L (ref 134–144)
Total Protein: 7.1 g/dL (ref 6.0–8.5)

## 2019-04-18 ENCOUNTER — Other Ambulatory Visit: Payer: Self-pay | Admitting: Internal Medicine

## 2019-04-18 NOTE — Telephone Encounter (Signed)
Please refill as per office routine med refill policy (all routine meds refilled for 3 mo or monthly per pt preference up to one year from last visit, then month to month grace period for 3 mo, then further med refills will have to be denied)  

## 2019-05-05 DIAGNOSIS — H401111 Primary open-angle glaucoma, right eye, mild stage: Secondary | ICD-10-CM | POA: Diagnosis not present

## 2019-05-05 DIAGNOSIS — H401122 Primary open-angle glaucoma, left eye, moderate stage: Secondary | ICD-10-CM | POA: Diagnosis not present

## 2019-05-05 DIAGNOSIS — H25813 Combined forms of age-related cataract, bilateral: Secondary | ICD-10-CM | POA: Diagnosis not present

## 2019-05-05 DIAGNOSIS — E119 Type 2 diabetes mellitus without complications: Secondary | ICD-10-CM | POA: Diagnosis not present

## 2019-05-10 DIAGNOSIS — H52209 Unspecified astigmatism, unspecified eye: Secondary | ICD-10-CM | POA: Diagnosis not present

## 2019-05-10 DIAGNOSIS — H524 Presbyopia: Secondary | ICD-10-CM | POA: Diagnosis not present

## 2019-05-10 DIAGNOSIS — H5213 Myopia, bilateral: Secondary | ICD-10-CM | POA: Diagnosis not present

## 2019-06-03 ENCOUNTER — Ambulatory Visit (INDEPENDENT_AMBULATORY_CARE_PROVIDER_SITE_OTHER): Payer: Medicare HMO | Admitting: Internal Medicine

## 2019-06-03 ENCOUNTER — Other Ambulatory Visit: Payer: Self-pay

## 2019-06-03 ENCOUNTER — Encounter: Payer: Self-pay | Admitting: Internal Medicine

## 2019-06-03 VITALS — BP 142/74 | HR 68 | Temp 98.7°F | Ht 68.0 in | Wt 191.0 lb

## 2019-06-03 DIAGNOSIS — Z0000 Encounter for general adult medical examination without abnormal findings: Secondary | ICD-10-CM | POA: Diagnosis not present

## 2019-06-03 DIAGNOSIS — E559 Vitamin D deficiency, unspecified: Secondary | ICD-10-CM | POA: Diagnosis not present

## 2019-06-03 DIAGNOSIS — E1159 Type 2 diabetes mellitus with other circulatory complications: Secondary | ICD-10-CM

## 2019-06-03 DIAGNOSIS — E611 Iron deficiency: Secondary | ICD-10-CM | POA: Diagnosis not present

## 2019-06-03 DIAGNOSIS — E538 Deficiency of other specified B group vitamins: Secondary | ICD-10-CM | POA: Diagnosis not present

## 2019-06-03 LAB — IBC PANEL
Iron: 62 ug/dL (ref 42–165)
Saturation Ratios: 19.8 % — ABNORMAL LOW (ref 20.0–50.0)
Transferrin: 224 mg/dL (ref 212.0–360.0)

## 2019-06-03 LAB — VITAMIN B12: Vitamin B-12: 244 pg/mL (ref 211–911)

## 2019-06-03 LAB — MICROALBUMIN / CREATININE URINE RATIO
Creatinine,U: 230.3 mg/dL
Microalb Creat Ratio: 1.2 mg/g (ref 0.0–30.0)
Microalb, Ur: 2.7 mg/dL — ABNORMAL HIGH (ref 0.0–1.9)

## 2019-06-03 LAB — HEPATIC FUNCTION PANEL
ALT: 19 U/L (ref 0–53)
AST: 22 U/L (ref 0–37)
Albumin: 3.9 g/dL (ref 3.5–5.2)
Alkaline Phosphatase: 35 U/L — ABNORMAL LOW (ref 39–117)
Bilirubin, Direct: 0.2 mg/dL (ref 0.0–0.3)
Total Bilirubin: 0.8 mg/dL (ref 0.2–1.2)
Total Protein: 6.7 g/dL (ref 6.0–8.3)

## 2019-06-03 LAB — CBC WITH DIFFERENTIAL/PLATELET
Basophils Absolute: 0 10*3/uL (ref 0.0–0.1)
Basophils Relative: 0.4 % (ref 0.0–3.0)
Eosinophils Absolute: 0.2 10*3/uL (ref 0.0–0.7)
Eosinophils Relative: 2.5 % (ref 0.0–5.0)
HCT: 34.7 % — ABNORMAL LOW (ref 39.0–52.0)
Hemoglobin: 11.3 g/dL — ABNORMAL LOW (ref 13.0–17.0)
Lymphocytes Relative: 26.2 % (ref 12.0–46.0)
Lymphs Abs: 1.7 10*3/uL (ref 0.7–4.0)
MCHC: 32.7 g/dL (ref 30.0–36.0)
MCV: 89.9 fl (ref 78.0–100.0)
Monocytes Absolute: 0.5 10*3/uL (ref 0.1–1.0)
Monocytes Relative: 8.4 % (ref 3.0–12.0)
Neutro Abs: 4 10*3/uL (ref 1.4–7.7)
Neutrophils Relative %: 62.5 % (ref 43.0–77.0)
Platelets: 194 10*3/uL (ref 150.0–400.0)
RBC: 3.86 Mil/uL — ABNORMAL LOW (ref 4.22–5.81)
RDW: 12.7 % (ref 11.5–15.5)
WBC: 6.4 10*3/uL (ref 4.0–10.5)

## 2019-06-03 LAB — BASIC METABOLIC PANEL
BUN: 14 mg/dL (ref 6–23)
CO2: 28 mEq/L (ref 19–32)
Calcium: 8.9 mg/dL (ref 8.4–10.5)
Chloride: 103 mEq/L (ref 96–112)
Creatinine, Ser: 1.38 mg/dL (ref 0.40–1.50)
GFR: 61.55 mL/min (ref >60.00)
Glucose, Bld: 153 mg/dL — ABNORMAL HIGH (ref 70–99)
Potassium: 3.9 mEq/L (ref 3.5–5.1)
Sodium: 137 mEq/L (ref 135–145)

## 2019-06-03 LAB — LIPID PANEL
Cholesterol: 77 mg/dL (ref 0–200)
HDL: 25.6 mg/dL — ABNORMAL LOW (ref >39.00)
LDL Cholesterol: 32 mg/dL (ref 0–99)
NonHDL: 51.61
Total CHOL/HDL Ratio: 3
Triglycerides: 98 mg/dL (ref 0.0–149.0)
VLDL: 19.6 mg/dL (ref 0.0–40.0)

## 2019-06-03 LAB — TSH: TSH: 1.16 u[IU]/mL (ref 0.35–4.50)

## 2019-06-03 LAB — PSA: PSA: 0.97 ng/mL (ref 0.10–4.00)

## 2019-06-03 LAB — VITAMIN D 25 HYDROXY (VIT D DEFICIENCY, FRACTURES): VITD: 38.87 ng/mL (ref 30.00–100.00)

## 2019-06-03 NOTE — Progress Notes (Signed)
Subjective:    Patient ID: Jesse Woods, male    DOB: 07-27-1950, 70 y.o.   MRN: [id number]  HPI  Here for wellness and f/u;  Overall doing ok;  Pt denies Chest pain, worsening SOB, DOE, wheezing, orthopnea, PND, worsening LE edema, palpitations, dizziness or syncope.  Pt denies neurological change such as new headache, facial or extremity weakness.  Pt denies polydipsia, polyuria, or low sugar symptoms. Pt states overall good compliance with treatment and medications, good tolerability, and has been trying to follow appropriate diet.  Pt denies worsening depressive symptoms, suicidal ideation or panic. No fever, night sweats, wt loss, loss of appetite, or other constitutional symptoms.  Pt states good ability with ADL's, has low fall risk, home safety reviewed and adequate, no other significant changes in hearing or vision, and only occasionally active with exercise. No new complaints Past Medical History:  Diagnosis Date  . Arthritis    "back and left hip" (12/22/2014)  . Chronic diastolic (congestive) heart failure ([place])   . Coronary artery disease 01/2015   chronic total occlusion of mid LCx and severely diseased RCA on previous cath in February 2016. EF at that time was 15%.He was cleared by neurosurgery for DAPT and then wasseen by Dr. Irish Lack to proceed with PCI of RCA. LHC 12/22/2014 showed 90% proximal to mid RCA lesion that treated with 3.0 x 32 mm Synergy DES  . DCM (dilated cardiomyopathy) ([place]) 06/22/2014   EF was 15% but now 35-40% after PCI. Echo 2019 with EF 60-65% with G1DD  . Diabetes mellitus, type II ([place])   . Hyperlipidemia   . Hypertension   . Subarachnoid hemorrhage ([place]) 1998   from HTN   Past Surgical History:  Procedure Laterality Date  . BRAIN SURGERY  1998   right frontal ventriculotomy with subsequent ventriculo-abfdominal shunt due to [facility]  . CARDIAC CATHETERIZATION N/A 12/22/2014   Procedure: Coronary Stent Intervention;  Surgeon: Jettie Booze, MD;   Location: [place] [facility];  Service: Cardiovascular;  Laterality: N/A;  . CARDIAC CATHETERIZATION  12/22/2014   Procedure: Left Heart Cath and Coronary Angiography;  Surgeon: Jettie Booze, MD;  Location: [place] [facility];  Service: Cardiovascular;;  . CARDIAC CATHETERIZATION  06/24/2014  . CORONARY ANGIOPLASTY    . CSF SHUNT  1998  . INGUINAL HERNIA REPAIR Right 1954  . LEFT HEART CATHETERIZATION WITH CORONARY ANGIOGRAM N/A 06/24/2014   Procedure: LEFT HEART CATHETERIZATION WITH CORONARY ANGIOGRAM;  Surgeon: Jettie Booze, MD;  Location: [facility] CATH LAB;  Service: Cardiovascular;  Laterality: N/A;    reports that he has never smoked. He has never used smokeless tobacco. He reports current alcohol use. He reports that he does not use drugs. family history includes Cancer in his brother, cousin, father, and mother; Diabetes in his father; Hypertension in his father; Pancreatic cancer in his mother; Prostate cancer in his father and paternal uncle. No Known Allergies Current Outpatient Medications on File Prior to Visit  Medication Sig Dispense Refill  . aspirin 81 MG tablet Take 81 mg by mouth daily.      . carvedilol (COREG) 12.5 MG tablet Take 1 tablets by mouth twice a day. 180 tablet 3  . clopidogrel (PLAVIX) 75 MG tablet TAKE 1 TABLET(75 MG) BY MOUTH DAILY 90 tablet 3  . ezetimibe (ZETIA) 10 MG tablet Take 1 tablet (10 mg total) by mouth daily. 90 tablet 3  . FLUAD QUADRIVALENT 0.5 ML injection     . furosemide (LASIX)  40 MG tablet Take 40 mg by mouth daily.  0  . hydrALAZINE (APRESOLINE) 25 MG tablet Take 0.5 tablets (12.5 mg total) by mouth 3 (three) times daily. 120 tablet 3  . HYDROcodone-acetaminophen (NORCO/VICODIN) 5-325 MG tablet     . isosorbide mononitrate (IMDUR) 30 MG 24 hr tablet Take 1 tablet (30 mg total) by mouth daily. 90 tablet 3  . lisinopril (ZESTRIL) 40 MG tablet TAKE 1 TABLET BY MOUTH EVERY DAY 90 tablet 3  . metFORMIN (GLUCOPHAGE) 1000 MG tablet Take 1  tablet (1,000 mg total) by mouth 2 (two) times daily with a meal. **OFFICE VISIT IS DUE FOR FUTURE REFILLS** 180 tablet 0  . Multiple Vitamins-Minerals (MULTIVITAMIN WITH MINERALS) tablet Take 1 tablet by mouth daily.      . nitroGLYCERIN (NITROSTAT) 0.4 MG SL tablet Place 1 tablet (0.4 mg total) under the tongue every 5 (five) minutes as needed for chest pain (x 3 tabs). 60 tablet 3  . rosuvastatin (CRESTOR) 40 MG tablet TAKE 1 TABLET BY MOUTH DAILY 90 tablet 3  . spironolactone (ALDACTONE) 25 MG tablet TAKE 1/2 TABLET(12.5 MG) BY MOUTH DAILY 45 tablet 3  . tetrahydrozoline-zinc (VISINE-AC) 0.05-0.25 % ophthalmic solution Place 1 drop into both eyes daily as needed (allergy irritation.).     No current facility-administered medications on file prior to visit.   Review of Systems All otherwise neg per pt     Objective:   Physical Exam BP (!) 142/74   Pulse 68   Temp 98.7 F (37.1 C)   Ht 5\' 8"  (1.727 m)   Wt 191 lb (86.6 kg)   SpO2 100%   BMI 29.04 kg/m  VS noted,  Constitutional: Pt appears in NAD HENT: Head: NCAT.  Right Ear: External ear normal.  Left Ear: External ear normal.  Eyes: . Pupils are equal, round, and reactive to light. Conjunctivae and EOM are normal Nose: without d/c or deformity Neck: Neck supple. Gross normal ROM Cardiovascular: Normal rate and regular rhythm.   Pulmonary/Chest: Effort normal and breath sounds without rales or wheezing.  Abd:  Soft, NT, ND, + BS, no organomegaly Neurological: Pt is alert. At baseline orientation, motor grossly intact Skin: Skin is warm. No rashes, other new lesions, no LE edema Psychiatric: Pt behavior is normal without agitation  Lab Results  Component Value Date   WBC 6.7 02/12/2017   HGB 12.0 (L) 02/12/2017   HCT 37.0 (L) 02/12/2017   PLT 202.0 02/12/2017   GLUCOSE 103 (H) 03/25/2019   CHOL 103 03/25/2019   TRIG 119 03/25/2019   HDL 37 (L) 03/25/2019   LDLDIRECT 215.3 01/03/2011   LDLCALC 44 03/25/2019   ALT 34  03/25/2019   AST 42 (H) 03/25/2019   NA 142 03/25/2019   K 4.3 03/25/2019   CL 105 03/25/2019   CREATININE 1.36 (H) 03/25/2019   BUN 10 03/25/2019   CO2 21 03/25/2019   TSH 1.15 02/12/2017   PSA 0.93 02/12/2017   INR 1.1 (H) 12/19/2014   HGBA1C 5.2 08/14/2017   MICROALBUR 2.4 (H) 02/12/2017          Assessment & Plan:

## 2019-06-03 NOTE — Assessment & Plan Note (Signed)
stable overall by history and exam, recent data reviewed with pt, and pt to continue medical treatment as before,  to f/u any worsening symptoms or concerns  

## 2019-06-03 NOTE — Assessment & Plan Note (Signed)

## 2019-06-03 NOTE — Patient Instructions (Signed)

## 2019-06-04 LAB — URINALYSIS, ROUTINE W REFLEX MICROSCOPIC
Bilirubin Urine: NEGATIVE
Hgb urine dipstick: NEGATIVE
Ketones, ur: NEGATIVE
Leukocytes,Ua: NEGATIVE
Nitrite: NEGATIVE
RBC / HPF: NONE SEEN (ref 0–?)
Specific Gravity, Urine: 1.025 (ref 1.000–1.030)
Total Protein, Urine: NEGATIVE
Urine Glucose: NEGATIVE
Urobilinogen, UA: 1 (ref 0.0–1.0)
pH: 5.5 (ref 5.0–8.0)

## 2019-06-04 LAB — HEMOGLOBIN A1C: Hgb A1c MFr Bld: 5.4 % (ref 4.6–6.5)

## 2019-06-05 ENCOUNTER — Encounter: Payer: Self-pay | Admitting: Internal Medicine

## 2019-06-10 ENCOUNTER — Ambulatory Visit: Payer: Medicare HMO | Attending: Internal Medicine

## 2019-06-10 DIAGNOSIS — Z23 Encounter for immunization: Secondary | ICD-10-CM | POA: Insufficient documentation

## 2019-06-10 NOTE — Progress Notes (Signed)
   Covid-19 Vaccination Clinic  Name:  Jesse Woods    MRN: [id number] DOB: 1950/10/09  06/10/2019  [name] was observed post Covid-19 immunization for 15 minutes without incident. He was provided with Vaccine Information Sheet and instruction to access the V-Safe system.   [name] was instructed to call 911 with any severe reactions post vaccine: Marland Kitchen Difficulty breathing  . Swelling of face and throat  . A fast heartbeat  . A bad rash all over body  . Dizziness and weakness   Immunizations Administered    Name Date Dose VIS Date Route   Pfizer COVID-19 Vaccine 06/10/2019  8:50 AM 0.3 mL 03/19/2019 Intramuscular   Manufacturer: [place]   Lot: [id number]   [place]: [id number]

## 2019-07-06 ENCOUNTER — Ambulatory Visit: Payer: Medicare HMO | Attending: Internal Medicine

## 2019-07-06 DIAGNOSIS — Z23 Encounter for immunization: Secondary | ICD-10-CM

## 2019-07-06 NOTE — Progress Notes (Signed)
   Covid-19 Vaccination Clinic  Name:  Jesse Woods    MRN: [id number] DOB: 06-12-1950  07/06/2019  [name] was observed post Covid-19 immunization for 15 minutes without incident. He was provided with Vaccine Information Sheet and instruction to access the V-Safe system.   [name] was instructed to call 911 with any severe reactions post vaccine: Marland Kitchen Difficulty breathing  . Swelling of face and throat  . A fast heartbeat  . A bad rash all over body  . Dizziness and weakness   Immunizations Administered    Name Date Dose VIS Date Route   Pfizer COVID-19 Vaccine 07/06/2019  1:40 PM 0.3 mL 03/19/2019 Intramuscular   Manufacturer: [place]   Lot: [id number]   [place]: [id number]

## 2019-09-07 ENCOUNTER — Other Ambulatory Visit: Payer: Self-pay | Admitting: Cardiology

## 2019-09-07 DIAGNOSIS — I2510 Atherosclerotic heart disease of native coronary artery without angina pectoris: Secondary | ICD-10-CM

## 2019-09-08 ENCOUNTER — Other Ambulatory Visit: Payer: Self-pay | Admitting: Cardiology

## 2019-09-09 NOTE — Progress Notes (Signed)
Cardiology Office Note    Date:  09/21/2019   ID:  Jesse Woods, DOB 1950-05-21, MRN [id number]  PCP:  Biagio Borg, MD  Cardiologist: Fransico Him, MD EPS: None  No chief complaint on file.   History of Present Illness:  Jesse Woods is a 69 y.o. male with a hx of HTN, HL, DM2, remote history of subarachnoid hemorrhage, CAD and ischemic cardiomyopathy. He was noted to have chronic total occlusion of mid LCx and severely diseased RCA on previous cath in February 2016. EF at that time was 15%.  A FU echo in August 2016 showed EF had improved to 35-40%.   LHC 12/22/2014 showed 90% proximal to mid RCA lesion that treated with 3.0 x 32 mm Synergy DES. He had residual chronic total occlusion of mid left circumflex with left to left collaterals.  His ACE I was changed to [facility] but unfortunately, his insurance did not cover Entresto and it was too expensive and changed to ACE I.His last echo in 2019 showed normalization of EF at 60%.     Last had visit with Dr. Radford Pax 03/2019 and was doing well.  Patient comes for f/u. BP running low-no dizziness. Was low at the dentist office yest. Hydralazine was listed as 1/2 tab tid last several visits but he says he's been on 25 mg tid.No chest pain or shortness of breath. No regular exercise. Has received his covid19 vaccine.  Past Medical History:  Diagnosis Date  . Arthritis    "back and left hip" (12/22/2014)  . Chronic diastolic (congestive) heart failure ([place])   . Coronary artery disease 01/2015   chronic total occlusion of mid LCx and severely diseased RCA on previous cath in February 2016. EF at that time was 15%.He was cleared by neurosurgery for DAPT and then wasseen by Dr. Irish Lack to proceed with PCI of RCA. LHC 12/22/2014 showed 90% proximal to mid RCA lesion that treated with 3.0 x 32 mm Synergy DES  . DCM (dilated cardiomyopathy) ([place]) 06/22/2014   EF was 15% but now 35-40% after PCI. Echo 2019 with EF 60-65% with G1DD  .  Diabetes mellitus, type II ([place])   . Hyperlipidemia   . Hypertension   . Subarachnoid hemorrhage ([place]) 1998   from HTN    Past Surgical History:  Procedure Laterality Date  . BRAIN SURGERY  1998   right frontal ventriculotomy with subsequent ventriculo-abfdominal shunt due to [facility]  . CARDIAC CATHETERIZATION N/A 12/22/2014   Procedure: Coronary Stent Intervention;  Surgeon: Jettie Booze, MD;  Location: [place] [facility];  Service: Cardiovascular;  Laterality: N/A;  . CARDIAC CATHETERIZATION  12/22/2014   Procedure: Left Heart Cath and Coronary Angiography;  Surgeon: Jettie Booze, MD;  Location: [place] [facility];  Service: Cardiovascular;;  . CARDIAC CATHETERIZATION  06/24/2014  . CORONARY ANGIOPLASTY    . CSF SHUNT  1998  . INGUINAL HERNIA REPAIR Right 1954  . LEFT HEART CATHETERIZATION WITH CORONARY ANGIOGRAM N/A 06/24/2014   Procedure: LEFT HEART CATHETERIZATION WITH CORONARY ANGIOGRAM;  Surgeon: Jettie Booze, MD;  Location: [facility] CATH LAB;  Service: Cardiovascular;  Laterality: N/A;    Current Medications: Current Meds  Medication Sig  . aspirin 81 MG tablet Take 81 mg by mouth daily.    . carvedilol (COREG) 12.5 MG tablet Take 1 tablets by mouth twice a day.  . clopidogrel (PLAVIX) 75 MG tablet TAKE 1 TABLET(75 MG) BY MOUTH DAILY  . ezetimibe (ZETIA) 10 MG tablet  TAKE 1 TABLET(10 MG) BY MOUTH DAILY  . FLUAD QUADRIVALENT 0.5 ML injection   . HYDROcodone-acetaminophen (NORCO/VICODIN) 5-325 MG tablet   . isosorbide mononitrate (IMDUR) 30 MG 24 hr tablet Take 1 tablet (30 mg total) by mouth daily.  Marland Kitchen lisinopril (ZESTRIL) 40 MG tablet TAKE 1 TABLET BY MOUTH EVERY DAY  . metFORMIN (GLUCOPHAGE) 1000 MG tablet Take 1 tablet (1,000 mg total) by mouth 2 (two) times daily with a meal. **OFFICE VISIT IS DUE FOR FUTURE REFILLS**  . Multiple Vitamins-Minerals (MULTIVITAMIN WITH MINERALS) tablet Take 1 tablet by mouth daily.    . nitroGLYCERIN (NITROSTAT) 0.4 MG SL tablet  Place 1 tablet (0.4 mg total) under the tongue every 5 (five) minutes as needed for chest pain (x 3 tabs).  . rosuvastatin (CRESTOR) 40 MG tablet TAKE 1 TABLET BY MOUTH EVERY DAY. YOU ARE PAST DUE FOR AN APPT. PLEASE CALL OFFICE TO SCHEDULE [phone]  . spironolactone (ALDACTONE) 25 MG tablet TAKE 1/2 TABLET(12.5 MG) BY MOUTH DAILY  . tetrahydrozoline-zinc (VISINE-AC) 0.05-0.25 % ophthalmic solution Place 1 drop into both eyes daily as needed (allergy irritation.).  . [DISCONTINUED] furosemide (LASIX) 40 MG tablet Take 40 mg by mouth daily.  . [DISCONTINUED] hydrALAZINE (APRESOLINE) 25 MG tablet Take 25 mg by mouth 3 (three) times daily.     Allergies:   Patient has no known allergies.   Social History   Socioeconomic History  . Marital status: Divorced    Spouse name: Not on file  . Number of children: 2  . Years of education: Not on file  . Highest education level: Not on file  Occupational History  . Occupation: retired  Tobacco Use  . Smoking status: Never Smoker  . Smokeless tobacco: Never Used  Substance and Sexual Activity  . Alcohol use: Yes    Comment: 12/22/2014 "no alcohol since 1998"  . Drug use: No  . Sexual activity: Not Currently    Partners: Female  Other Topics Concern  . Not on file  Social History Narrative   HSG, Graduated [employer]. Married- '72-'86; remarried '86-'90, single. 1 son- '73, 1 daughter-'79; 2 grandchildren. Retired after [employer]	[place], worked for [employer] before retirement. Had served in [employer] force. He did live in [place]. Lives together with his son. Very active in his church   Social Determinants of Health   Financial Resource Strain:   . Difficulty of Paying Living Expenses:   Food Insecurity:   . Worried About Charity fundraiser in the Last Year:   . Arboriculturist in the Last Year:   Transportation Needs:   . Film/video editor (Medical):   Marland Kitchen Lack of Transportation (Non-Medical):    Physical Activity:   . Days of Exercise per Week:   . Minutes of Exercise per Session:   Stress:   . Feeling of Stress :   Social Connections:   . Frequency of Communication with Friends and Family:   . Frequency of Social Gatherings with Friends and Family:   . Attends Religious Services:   . Active Member of Clubs or Organizations:   . Attends Archivist Meetings:   Marland Kitchen Marital Status:      Family History:  The patient's family history includes Cancer in his brother, cousin, father, and mother; Diabetes in his father; Hypertension in his father; Pancreatic cancer in his mother; Prostate cancer in his father and paternal uncle.   ROS:   Please see the history of present illness.  ROS All other systems reviewed and are negative.   PHYSICAL EXAM:   VS:  BP (!) 88/64   Pulse 72   Ht 5\' 8"  (1.727 m)   Wt 179 lb (81.2 kg)   SpO2 97%   BMI 27.22 kg/m   Physical Exam  GEN: Well nourished, well developed, in no acute distress  Neck: no JVD, carotid bruits, or masses Cardiac:RRR; no murmurs, rubs, or gallops  Respiratory:  clear to auscultation bilaterally, normal work of breathing GI: soft, nontender, nondistended, + BS Ext: without cyanosis, clubbing, or edema, Good distal pulses bilaterally Neuro:  Alert and Oriented x 3 Psych: euthymic mood, full affect  Wt Readings from Last 3 Encounters:  09/21/19 179 lb (81.2 kg)  06/03/19 191 lb (86.6 kg)  03/23/19 184 lb (83.5 kg)      Studies/Labs Reviewed:   EKG:  EKG is  ordered today.  The ekg ordered today demonstrates NSR, inf Q waves Nonspecific ST changes. No change from prior EKG  Recent Labs: 06/03/2019: ALT 19; BUN 14; Creatinine, Ser 1.38; Hemoglobin 11.3; Platelets 194.0; Potassium 3.9; Sodium 137; TSH 1.16   Lipid Panel    Component Value Date/Time   CHOL 77 06/03/2019 1451   CHOL 103 03/25/2019 1008   TRIG 98.0 06/03/2019 1451   HDL 25.60 (L) 06/03/2019 1451   HDL 37 (L) 03/25/2019 1008   CHOLHDL  3 06/03/2019 1451   VLDL 19.6 06/03/2019 1451   LDLCALC 32 06/03/2019 1451   LDLCALC 44 03/25/2019 1008   LDLDIRECT 215.3 01/03/2011 1035    Additional studies/ records that were reviewed today include:  Echo 07/2017 Study Conclusions   - Left ventricle: The cavity size was normal. Wall thickness was    increased in a pattern of mild LVH. Systolic function was normal.    The estimated ejection fraction was in the range of 60% to 65%.    Wall motion was normal; there were no regional wall motion    abnormalities. Doppler parameters are consistent with abnormal    left ventricular relaxation (grade 1 diastolic dysfunction).  - Aortic valve: There was trivial regurgitation.  - Pulmonary arteries: Systolic pressure was mildly increased. PA    peak pressure: 32 mm Hg (S).      ASSESSMENT:    1. Coronary artery disease involving native coronary artery of native heart without angina pectoris   2. Ischemic cardiomyopathy   3. Chronic diastolic CHF (congestive heart failure) ([place])   4. Essential hypertension   5. Hyperlipidemia, unspecified hyperlipidemia type      PLAN:  In order of problems listed above:  Hypotension-asymptomatic but BP low in both arms on recheck. Will decrease lasix 20 mg daily, hydralazine 50 mg 1/2 tid, check labs and see him back in 2-3 weeks. Give BP cuff to monitor at home  CAD with CTO Cfx, DES RCA 2016 on ASA, Plavix, Imdur, statin-no angina-150 min exercise weekly  ICM EF 15% 2016 improved to 60-65% 07/2017-on Coreg, lisinopril and spiro-labs stable [id number]  Chronic diastolic CHF compensated-reduce hydralazine and lasix  HTN BP running low  HLD on Crestor and Zetia LDL 32 06/03/19      Medication Adjustments/Labs and Tests Ordered: Current medicines are reviewed at length with the patient today.  Concerns regarding medicines are outlined above.  Medication changes, Labs and Tests ordered today are listed in the Patient Instructions below. Patient  Instructions  Medication Instructions:  Your physician has recommended you make the following change in your medication:  1. DECREASE LASIX TO 20 MG DAILY  2. DECREASE HYDRALAZINE TO 12.5 MG THREE TIMES DAILY.  *If you need a refill on your cardiac medications before your next appointment, please call your pharmacy*   Lab Work: TODAY: CMET, CBC If you have labs (blood work) drawn today and your tests are completely normal, you will receive your results only by: Marland Kitchen MyChart Message (if you have MyChart) OR . A paper copy in the mail If you have any lab test that is abnormal or we need to change your treatment, we will call you to review the results.   Testing/Procedures: NONE  Follow-Up: At [facility], you and your health needs are our priority.  As part of our continuing mission to provide you with exceptional heart care, we have created designated Provider Care Teams.  These Care Teams include your primary Cardiologist (physician) and Advanced Practice Providers (APPs -  Physician Assistants and Nurse Practitioners) who all work together to provide you with the care you need, when you need it.  We recommend signing up for the patient portal called "MyChart".  Sign up information is provided on this After Visit Summary.  MyChart is used to connect with patients for Virtual Visits (Telemedicine).  Patients are able to view lab/test results, encounter notes, upcoming appointments, etc.  Non-urgent messages can be sent to your provider as well.   To learn more about what you can do with MyChart, go to [email or web address].    Your next appointment:   2 week(s)  The format for your next appointment:   In Person  Provider:   Ermalinda Barrios, PA-C        Signed, Ermalinda Barrios, PA-C  09/21/2019 11:17 AM    [address], [place], [place]  [address] Phone: [phone]; Fax: [phone]

## 2019-09-10 ENCOUNTER — Other Ambulatory Visit: Payer: Self-pay

## 2019-09-10 MED ORDER — SPIRONOLACTONE 25 MG PO TABS: ORAL_TABLET | ORAL | 0 refills | Status: DC

## 2019-09-20 ENCOUNTER — Other Ambulatory Visit: Payer: Self-pay | Admitting: Cardiology

## 2019-09-21 ENCOUNTER — Encounter: Payer: Self-pay | Admitting: Physician Assistant

## 2019-09-21 ENCOUNTER — Ambulatory Visit: Payer: Medicare PPO | Admitting: Physician Assistant

## 2019-09-21 ENCOUNTER — Other Ambulatory Visit: Payer: Self-pay

## 2019-09-21 VITALS — BP 88/64 | HR 72 | Ht 68.0 in | Wt 179.0 lb

## 2019-09-21 DIAGNOSIS — I2510 Atherosclerotic heart disease of native coronary artery without angina pectoris: Secondary | ICD-10-CM | POA: Diagnosis not present

## 2019-09-21 DIAGNOSIS — E785 Hyperlipidemia, unspecified: Secondary | ICD-10-CM

## 2019-09-21 DIAGNOSIS — I5032 Chronic diastolic (congestive) heart failure: Secondary | ICD-10-CM

## 2019-09-21 DIAGNOSIS — I10 Essential (primary) hypertension: Secondary | ICD-10-CM

## 2019-09-21 DIAGNOSIS — I255 Ischemic cardiomyopathy: Secondary | ICD-10-CM | POA: Diagnosis not present

## 2019-09-21 LAB — COMPREHENSIVE METABOLIC PANEL
ALT: 34 IU/L (ref 0–44)
AST: 46 IU/L — ABNORMAL HIGH (ref 0–40)
Albumin/Globulin Ratio: 1.4 (ref 1.2–2.2)
Albumin: 4.4 g/dL (ref 3.8–4.8)
Alkaline Phosphatase: 42 IU/L — ABNORMAL LOW (ref 48–121)
BUN/Creatinine Ratio: 11 (ref 10–24)
BUN: 21 mg/dL (ref 8–27)
Bilirubin Total: 0.8 mg/dL (ref 0.0–1.2)
CO2: 22 mmol/L (ref 20–29)
Calcium: 9.9 mg/dL (ref 8.6–10.2)
Chloride: 104 mmol/L (ref 96–106)
Creatinine, Ser: 1.88 mg/dL — ABNORMAL HIGH (ref 0.76–1.27)
GFR calc Af Amer: 41 mL/min/{1.73_m2} — ABNORMAL LOW (ref >59)
GFR calc non Af Amer: 35 mL/min/{1.73_m2} — ABNORMAL LOW (ref >59)
Globulin, Total: 3.1 g/dL (ref 1.5–4.5)
Glucose: 116 mg/dL — ABNORMAL HIGH (ref 65–99)
Potassium: 5.7 mmol/L — ABNORMAL HIGH (ref 3.5–5.2)
Sodium: 139 mmol/L (ref 134–144)
Total Protein: 7.5 g/dL (ref 6.0–8.5)

## 2019-09-21 LAB — CBC
Hematocrit: 35.8 % — ABNORMAL LOW (ref 37.5–51.0)
Hemoglobin: 11.4 g/dL — ABNORMAL LOW (ref 13.0–17.7)
MCH: 29.5 pg (ref 26.6–33.0)
MCHC: 31.8 g/dL (ref 31.5–35.7)
MCV: 93 fL (ref 79–97)
Platelets: 217 10*3/uL (ref 150–450)
RBC: 3.87 x10E6/uL — ABNORMAL LOW (ref 4.14–5.80)
RDW: 11.8 % (ref 11.6–15.4)
WBC: 7 10*3/uL (ref 3.4–10.8)

## 2019-09-21 MED ORDER — FUROSEMIDE 20 MG PO TABS: 20.0000 mg | ORAL_TABLET | Freq: Every day | ORAL | 3 refills | Status: DC

## 2019-09-21 MED ORDER — HYDRALAZINE HCL 25 MG PO TABS
12.5000 mg | ORAL_TABLET | Freq: Three times a day (TID) | ORAL | 3 refills | Status: DC
Start: 2019-09-21 — End: 2019-10-20

## 2019-09-21 NOTE — Patient Instructions (Signed)
Medication Instructions:  Your physician has recommended you make the following change in your medication:  1. DECREASE LASIX TO 20 MG DAILY  2. DECREASE HYDRALAZINE TO 12.5 MG THREE TIMES DAILY.  *If you need a refill on your cardiac medications before your next appointment, please call your pharmacy*   Lab Work: TODAY: CMET, CBC If you have labs (blood work) drawn today and your tests are completely normal, you will receive your results only by: Marland Kitchen MyChart Message (if you have MyChart) OR . A paper copy in the mail If you have any lab test that is abnormal or we need to change your treatment, we will call you to review the results.   Testing/Procedures: NONE  Follow-Up: At [facility], you and your health needs are our priority.  As part of our continuing mission to provide you with exceptional heart care, we have created designated Provider Care Teams.  These Care Teams include your primary Cardiologist (physician) and Advanced Practice Providers (APPs -  Physician Assistants and Nurse Practitioners) who all work together to provide you with the care you need, when you need it.  We recommend signing up for the patient portal called "MyChart".  Sign up information is provided on this After Visit Summary.  MyChart is used to connect with patients for Virtual Visits (Telemedicine).  Patients are able to view lab/test results, encounter notes, upcoming appointments, etc.  Non-urgent messages can be sent to your provider as well.   To learn more about what you can do with MyChart, go to [email or web address].    Your next appointment:   2 week(s)  The format for your next appointment:   In Person  Provider:   Ermalinda Barrios, PA-C

## 2019-09-27 ENCOUNTER — Other Ambulatory Visit: Payer: Self-pay | Admitting: Cardiology

## 2019-10-08 ENCOUNTER — Other Ambulatory Visit: Payer: Self-pay | Admitting: Cardiology

## 2019-10-11 ENCOUNTER — Other Ambulatory Visit: Payer: Self-pay | Admitting: Cardiology

## 2019-10-19 NOTE — Progress Notes (Signed)
Cardiology Office Note    Date:  10/20/2019   ID:  Jesse Woods, DOB [birth year], MRN [id number]  PCP:  Biagio Borg, MD  Cardiologist: Fransico Him, MD EPS: None  No chief complaint on file.   History of Present Illness:  Jesse Woods is a 69 y.o. male with a hx of HTN, HL, DM2, remote history of subarachnoid hemorrhage, CAD and ischemic cardiomyopathy. He was noted to have chronic total occlusion of mid LCx and severely diseased RCA on previous cath in February 2016. EF at that time was 15%.  A FU echo in August 2016 showed EF had improved to 35-40%.   LHC 12/22/2014 showed 90% proximal to mid RCA lesion that treated with 3.0 x 32 mm Synergy DES. He had residual chronic total occlusion of mid left circumflex with left to left collaterals.  His ACE I was changed to [facility] but unfortunately, his insurance did not cover Entresto and it was too expensive and changed to ACE I.His last echo in 2019 showed normalization of EF at 60%.     Last had visit with Dr. Radford Pax 03/2019 and was doing well.  I saw the patient 09/21/2019 with low blood pressure and he was taken hydralazine 25 mg 3 times daily instead of a half a tablet 3 times daily.  To half a tablet 3 times daily and decrease his Lasix to 20 mg daily.  I gave him a blood pressure cuff to monitor at home. Crt was up to 1.88 that day.  Patient comes in for follow-up.  Overall he feels good.  No edema.  Blood pressure is a little better but still low.  He has not taken his blood pressures at home.  Denies chest pain, palpitations, dyspnea  Past Medical History:  Diagnosis Date  . Arthritis    "back and left hip" (12/22/2014)  . Chronic diastolic (congestive) heart failure ([place])   . Coronary artery disease 01/2015   chronic total occlusion of mid LCx and severely diseased RCA on previous cath in February 2016. EF at that time was 15%.He was cleared by neurosurgery for DAPT and then wasseen by Dr. Irish Lack to proceed with PCI of  RCA. LHC 12/22/2014 showed 90% proximal to mid RCA lesion that treated with 3.0 x 32 mm Synergy DES  . DCM (dilated cardiomyopathy) ([place]) 06/22/2014   EF was 15% but now 35-40% after PCI. Echo 2019 with EF 60-65% with G1DD  . Diabetes mellitus, type II ([place])   . Hyperlipidemia   . Hypertension   . Subarachnoid hemorrhage ([place]) 1998   from HTN    Past Surgical History:  Procedure Laterality Date  . BRAIN SURGERY  1998   right frontal ventriculotomy with subsequent ventriculo-abfdominal shunt due to [facility]  . CARDIAC CATHETERIZATION N/A 12/22/2014   Procedure: Coronary Stent Intervention;  Surgeon: Jettie Booze, MD;  Location: [place] [facility];  Service: Cardiovascular;  Laterality: N/A;  . CARDIAC CATHETERIZATION  12/22/2014   Procedure: Left Heart Cath and Coronary Angiography;  Surgeon: Jettie Booze, MD;  Location: [place] [facility];  Service: Cardiovascular;;  . CARDIAC CATHETERIZATION  06/24/2014  . CORONARY ANGIOPLASTY    . CSF SHUNT  1998  . INGUINAL HERNIA REPAIR Right 1954  . LEFT HEART CATHETERIZATION WITH CORONARY ANGIOGRAM N/A 06/24/2014   Procedure: LEFT HEART CATHETERIZATION WITH CORONARY ANGIOGRAM;  Surgeon: Jettie Booze, MD;  Location: [facility] C[facility];  Service: Cardiovascular;  Laterality: N/A;    Current Medications:  Current Meds  Medication Sig  . aspirin 81 MG tablet Take 81 mg by mouth daily.    . carvedilol (COREG) 12.5 MG tablet TAKE 1 TABLET BY MOUTH TWICE DAILY  . clopidogrel (PLAVIX) 75 MG tablet TAKE 1 TABLET(75 MG) BY MOUTH DAILY  . ezetimibe (ZETIA) 10 MG tablet TAKE 1 TABLET(10 MG) BY MOUTH DAILY  . FLUAD QUADRIVALENT 0.5 ML injection   . furosemide (LASIX) 20 MG tablet Take 1 tablet (20 mg total) by mouth daily.  Marland Kitchen HYDROcodone-acetaminophen (NORCO/VICODIN) 5-325 MG tablet   . isosorbide mononitrate (IMDUR) 30 MG 24 hr tablet TAKE 1 TABLET(30 MG) BY MOUTH DAILY  . lisinopril (ZESTRIL) 40 MG tablet TAKE 1 TABLET BY MOUTH EVERY DAY  .  metFORMIN (GLUCOPHAGE) 1000 MG tablet Take 1 tablet (1,000 mg total) by mouth 2 (two) times daily with a meal. **OFFICE VISIT IS DUE FOR FUTURE REFILLS**  . Multiple Vitamins-Minerals (MULTIVITAMIN WITH MINERALS) tablet Take 1 tablet by mouth daily.    . nitroGLYCERIN (NITROSTAT) 0.4 MG SL tablet Place 1 tablet (0.4 mg total) under the tongue every 5 (five) minutes as needed for chest pain (x 3 tabs).  . rosuvastatin (CRESTOR) 40 MG tablet TAKE 1 TABLET BY MOUTH EVERY DAY. YOU ARE PAST DUE FOR AN APPT. PLEASE CALL OFFICE TO SCHEDULE [phone]  . spironolactone (ALDACTONE) 25 MG tablet TAKE 1/2 TABLET(12.5 MG) BY MOUTH DAILY  . tetrahydrozoline-zinc (VISINE-AC) 0.05-0.25 % ophthalmic solution Place 1 drop into both eyes daily as needed (allergy irritation.).  . [DISCONTINUED] hydrALAZINE (APRESOLINE) 25 MG tablet Take 0.5 tablets (12.5 mg total) by mouth 3 (three) times daily.     Allergies:   Patient has no known allergies.   Social History   Socioeconomic History  . Marital status: Divorced    Spouse name: Not on file  . Number of children: 2  . Years of education: Not on file  . Highest education level: Not on file  Occupational History  . Occupation: retired  Tobacco Use  . Smoking status: Never Smoker  . Smokeless tobacco: Never Used  Substance and Sexual Activity  . Alcohol use: Yes    Comment: 12/22/2014 "no alcohol since 1998"  . Drug use: No  . Sexual activity: Not Currently    Partners: Female  Other Topics Concern  . Not on file  Social History Narrative   HSG, Graduated [employer]. Married- '72-'86; remarried '86-'90, single. 1 son- '73, 1 daughter-'79; 2 grandchildren. Retired after [employer]	[place], worked for [employer] before retirement. Had served in [employer] force. He did live in [place]. Lives together with his son. Very active in his church   Social Determinants of Health   Financial Resource Strain:   . Difficulty of Paying  Living Expenses:   Food Insecurity:   . Worried About Charity fundraiser in the Last Year:   . Arboriculturist in the Last Year:   Transportation Needs:   . Film/video editor (Medical):   Marland Kitchen Lack of Transportation (Non-Medical):   Physical Activity:   . Days of Exercise per Week:   . Minutes of Exercise per Session:   Stress:   . Feeling of Stress :   Social Connections:   . Frequency of Communication with Friends and Family:   . Frequency of Social Gatherings with Friends and Family:   . Attends Religious Services:   . Active Member of Clubs or Organizations:   . Attends Archivist Meetings:   .  Marital Status:      Family History:  The patient's family history includes Cancer in his brother, cousin, father, and mother; Diabetes in his father; Hypertension in his father; Pancreatic cancer in his mother; Prostate cancer in his father and paternal uncle.   ROS:   Please see the history of present illness.    ROS All other systems reviewed and are negative.   PHYSICAL EXAM:   VS:  BP 100/66   Pulse 71   Ht 5\' 8"  (1.727 m)   Wt 181 lb (82.1 kg)   SpO2 98%   BMI 27.52 kg/m   Physical Exam  GEN: Well nourished, well developed, in no acute distress  Neck: no JVD, carotid bruits, or masses Cardiac:RRR; no murmurs, rubs, or gallops  Respiratory:  clear to auscultation bilaterally, normal work of breathing GI: soft, nontender, nondistended, + BS Ext: without cyanosis, clubbing, or edema, Good distal pulses bilaterally Neuro:  Alert and Oriented x 3 Psych: euthymic mood, full affect  Wt Readings from Last 3 Encounters:  10/20/19 181 lb (82.1 kg)  09/21/19 179 lb (81.2 kg)  06/03/19 191 lb (86.6 kg)      Studies/Labs Reviewed:   EKG:  EKG is not ordered today.    Recent Labs: 06/03/2019: TSH 1.16 09/21/2019: ALT 34; BUN 21; Creatinine, Ser 1.88; Hemoglobin 11.4; Platelets 217; Potassium 5.7; Sodium 139   Lipid Panel    Component Value Date/Time   CHOL  77 06/03/2019 1451   CHOL 103 03/25/2019 1008   TRIG 98.0 06/03/2019 1451   HDL 25.60 (L) 06/03/2019 1451   HDL 37 (L) 03/25/2019 1008   CHOLHDL 3 06/03/2019 1451   VLDL 19.6 06/03/2019 1451   LDLCALC 32 06/03/2019 1451   LDLCALC 44 03/25/2019 1008   LDLDIRECT 215.3 01/03/2011 1035    Additional studies/ records that were reviewed today include:  Echo 07/2017 Study Conclusions   - Left ventricle: The cavity size was normal. Wall thickness was    increased in a pattern of mild LVH. Systolic function was normal.    The estimated ejection fraction was in the range of 60% to 65%.    Wall motion was normal; there were no regional wall motion    abnormalities. Doppler parameters are consistent with abnormal    left ventricular relaxation (grade 1 diastolic dysfunction).  - Aortic valve: There was trivial regurgitation.  - Pulmonary arteries: Systolic pressure was mildly increased. PA    peak pressure: 32 mm Hg (S).          ASSESSMENT:    1. Hypotension due to drugs   2. Coronary artery disease involving native coronary artery of native heart without angina pectoris   3. Ischemic cardiomyopathy   4. Chronic diastolic CHF (congestive heart failure) ([place])   5. Essential hypertension   6. Hyperlipidemia, unspecified hyperlipidemia type      PLAN:  In order of problems listed above:  Hypotension I decrease Lasix to 20 mg daily and hydralazine 25 mg 1/2 3 times daily. Crt 09/21/19 was up to 1.88 from 1.38. BP has come up some but still low. Will stop lasix-(can take prn for edema) and hydralazine. Check BP daily, repeat bmet and f/u with me in 4-6 weeks.  CAD with CTO of the circumflex, DES to the RCA in 2016 on aspirin Plavix Imdur and statin.  No angina  Ischemic cardiomyopathy ejection fraction 15% in 2016 improved to 60 to 65% 2019 on Coreg lisinopril and spironolactone  Chronic diastolic CHF  compensated  Hypertension now with hypotension adjusted meds as  above  Hyperlipidemia on Crestor    Medication Adjustments/Labs and Tests Ordered: Current medicines are reviewed at length with the patient today.  Concerns regarding medicines are outlined above.  Medication changes, Labs and Tests ordered today are listed in the Patient Instructions below. There are no Patient Instructions on file for this visit.   Sumner Boast, PA-C  10/20/2019 11:31 AM    [facility] [place], [place], [place]  [address] Phone: [phone]; Fax: [phone]

## 2019-10-20 ENCOUNTER — Other Ambulatory Visit: Payer: Self-pay

## 2019-10-20 ENCOUNTER — Encounter: Payer: Self-pay | Admitting: Physician Assistant

## 2019-10-20 ENCOUNTER — Ambulatory Visit: Payer: Medicare Other | Admitting: Physician Assistant

## 2019-10-20 VITALS — BP 100/66 | HR 71 | Ht 68.0 in | Wt 181.0 lb

## 2019-10-20 DIAGNOSIS — I952 Hypotension due to drugs: Secondary | ICD-10-CM | POA: Diagnosis not present

## 2019-10-20 DIAGNOSIS — I5032 Chronic diastolic (congestive) heart failure: Secondary | ICD-10-CM

## 2019-10-20 DIAGNOSIS — I10 Essential (primary) hypertension: Secondary | ICD-10-CM

## 2019-10-20 DIAGNOSIS — I255 Ischemic cardiomyopathy: Secondary | ICD-10-CM | POA: Diagnosis not present

## 2019-10-20 DIAGNOSIS — E785 Hyperlipidemia, unspecified: Secondary | ICD-10-CM

## 2019-10-20 DIAGNOSIS — I2510 Atherosclerotic heart disease of native coronary artery without angina pectoris: Secondary | ICD-10-CM

## 2019-10-20 LAB — BASIC METABOLIC PANEL
BUN/Creatinine Ratio: 11 (ref 10–24)
BUN: 19 mg/dL (ref 8–27)
CO2: 22 mmol/L (ref 20–29)
Calcium: 9.4 mg/dL (ref 8.6–10.2)
Chloride: 104 mmol/L (ref 96–106)
Creatinine, Ser: 1.79 mg/dL — ABNORMAL HIGH (ref 0.76–1.27)
GFR calc Af Amer: 43 mL/min/{1.73_m2} — ABNORMAL LOW (ref >59)
GFR calc non Af Amer: 38 mL/min/{1.73_m2} — ABNORMAL LOW (ref >59)
Glucose: 98 mg/dL (ref 65–99)
Potassium: 5.3 mmol/L — ABNORMAL HIGH (ref 3.5–5.2)
Sodium: 141 mmol/L (ref 134–144)

## 2019-10-20 MED ORDER — FUROSEMIDE 20 MG PO TABS
20.0000 mg | ORAL_TABLET | ORAL | 3 refills | Status: DC | PRN
Start: 2019-10-20 — End: 2020-09-14

## 2019-10-20 NOTE — Patient Instructions (Signed)
Medication Instructions:  Your physician has recommended you make the following change in your medication:   STOP taking Hydralazine START taking Furosemide ONLY as needed for Edema or weight gain of 2-3lbs overnight  *If you need a refill on your cardiac medications before your next appointment, please call your pharmacy*   Lab Work: BMET Today  If you have labs (blood work) drawn today and your tests are completely normal, you will receive your results only by: Marland Kitchen MyChart Message (if you have MyChart) OR . A paper copy in the mail If you have any lab test that is abnormal or we need to change your treatment, we will call you to review the results.   Testing/Procedures: None   Follow-Up: At [facility], you and your health needs are our priority.  As part of our continuing mission to provide you with exceptional heart care, we have created designated Provider Care Teams.  These Care Teams include your primary Cardiologist (physician) and Advanced Practice Providers (APPs -  Physician Assistants and Nurse Practitioners) who all work together to provide you with the care you need, when you need it.  We recommend signing up for the patient portal called "MyChart".  Sign up information is provided on this After Visit Summary.  MyChart is used to connect with patients for Virtual Visits (Telemedicine).  Patients are able to view lab/test results, encounter notes, upcoming appointments, etc.  Non-urgent messages can be sent to your provider as well.   To learn more about what you can do with MyChart, go to [email or web address].    Your next appointment:   11/30/2019 @ 12:15   Provider:   Ermalinda Barrios, PA-C   Other Instructions Monitor your Blood Pressure and bring record with you to your next appointment

## 2019-10-22 ENCOUNTER — Other Ambulatory Visit: Payer: Self-pay

## 2019-10-22 DIAGNOSIS — I5032 Chronic diastolic (congestive) heart failure: Secondary | ICD-10-CM

## 2019-10-22 DIAGNOSIS — Z5181 Encounter for therapeutic drug level monitoring: Secondary | ICD-10-CM

## 2019-10-27 ENCOUNTER — Other Ambulatory Visit: Payer: Self-pay | Admitting: Cardiology

## 2019-10-29 ENCOUNTER — Other Ambulatory Visit: Payer: Self-pay | Admitting: Internal Medicine

## 2019-10-29 NOTE — Telephone Encounter (Signed)
Please refill as per office routine med refill policy (all routine meds refilled for 3 mo or monthly per pt preference up to one year from last visit, then month to month grace period for 3 mo, then further med refills will have to be denied)  

## 2019-11-05 ENCOUNTER — Other Ambulatory Visit: Payer: Medicare Other | Admitting: *Deleted

## 2019-11-05 ENCOUNTER — Other Ambulatory Visit: Payer: Self-pay

## 2019-11-05 DIAGNOSIS — Z5181 Encounter for therapeutic drug level monitoring: Secondary | ICD-10-CM | POA: Diagnosis not present

## 2019-11-05 DIAGNOSIS — I5032 Chronic diastolic (congestive) heart failure: Secondary | ICD-10-CM

## 2019-11-05 LAB — BASIC METABOLIC PANEL
BUN/Creatinine Ratio: 9 — ABNORMAL LOW (ref 10–24)
BUN: 14 mg/dL (ref 8–27)
CO2: 23 mmol/L (ref 20–29)
Calcium: 9.5 mg/dL (ref 8.6–10.2)
Chloride: 105 mmol/L (ref 96–106)
Creatinine, Ser: 1.63 mg/dL — ABNORMAL HIGH (ref 0.76–1.27)
GFR calc Af Amer: 49 mL/min/{1.73_m2} — ABNORMAL LOW (ref >59)
GFR calc non Af Amer: 42 mL/min/{1.73_m2} — ABNORMAL LOW (ref >59)
Glucose: 93 mg/dL (ref 65–99)
Potassium: 4.5 mmol/L (ref 3.5–5.2)
Sodium: 141 mmol/L (ref 134–144)

## 2019-11-18 ENCOUNTER — Other Ambulatory Visit: Payer: Self-pay | Admitting: Cardiology

## 2019-11-18 ENCOUNTER — Telehealth: Payer: Self-pay | Admitting: Cardiology

## 2019-11-18 MED ORDER — EZETIMIBE 10 MG PO TABS: 10.0000 mg | ORAL_TABLET | Freq: Every day | ORAL | 3 refills | Status: DC

## 2019-11-18 NOTE — Telephone Encounter (Signed)
Pt's medication was sent to pt's pharmacy as requested. Confirmation received.  °

## 2019-11-18 NOTE — Telephone Encounter (Signed)
*  STAT* If patient is at the pharmacy, call can be transferred to refill team.   1. Which medications need to be refilled? (please list name of each medication and dose if known) ezetimibe (ZETIA) 10 MG tablet  2. Which pharmacy/location (including street and city if local pharmacy) is medication to be sent to? [id number] - [place], [place]  3. Do they need a 30 day or 90 day supply? 90 day supply

## 2019-11-24 NOTE — Progress Notes (Deleted)
Cardiology Office Note    Date:  11/24/2019   ID:  Jesse Woods, DOB 08-12-1950, MRN [id number]  PCP:  Biagio Borg, MD  Cardiologist: Fransico Him, MD EPS: None  No chief complaint on file.   History of Present Illness:  Jesse Woods is a 69 y.o. male  with a hx of HTN, HL, DM2, remote history of subarachnoid hemorrhage, CAD and ischemic cardiomyopathy. He was noted to have chronic total occlusion of mid LCx and severely diseased RCA on previous cath in February 2016. EF at that time was 15%.  A FU echo in August 2016 showed EF had improved to 35-40%.   LHC 12/22/2014 showed 90% proximal to mid RCA lesion that treated with 3.0 x 32 mm Synergy DES. He had residual chronic total occlusion of mid left circumflex with left to left collaterals.  His ACE I was changed to [facility] but unfortunately, his insurance did not cover Entresto and it was too expensive and changed to ACE I.His last echo in 2019 showed normalization of EF at 60%.     Last had visit with Dr. Radford Pax 03/2019 and was doing well.   I saw the patient 09/21/2019 with low blood pressure and he was taken hydralazine 25 mg 3 times daily instead of a half a tablet 3 times daily.  To half a tablet 3 times daily and decrease his Lasix to 20 mg daily.  I gave him a blood pressure cuff to monitor at home. Crt was up to 1.88 that day.   Follow-up 10/20/2019 blood pressure was still low so I stopped his Lasix and hydralazine.    Past Medical History:  Diagnosis Date   Arthritis    "back and left hip" (12/22/2014)   Chronic diastolic (congestive) heart failure ([facility])    Coronary artery disease 01/2015   chronic total occlusion of mid LCx and severely diseased RCA on previous cath in February 2016. EF at that time was 15%.He was cleared by neurosurgery for DAPT and then wasseen by Dr. Irish Lack to proceed with PCI of RCA. LHC 12/22/2014 showed 90% proximal to mid RCA lesion that treated with 3.0 x 32 mm Synergy DES   DCM  (dilated cardiomyopathy) ([place]) 06/22/2014   EF was 15% but now 35-40% after PCI. Echo 2019 with EF 60-65% with G1DD   Diabetes mellitus, type II ([place])    Hyperlipidemia    Hypertension    Subarachnoid hemorrhage ([place]) 1998   from HTN    Past Surgical History:  Procedure Laterality Date   [place]   right frontal ventriculotomy with subsequent ventriculo-abfdominal shunt due to [place] N/A 12/22/2014   Procedure: Coronary Stent Intervention;  Surgeon: Jettie Booze, MD;  Location: [place] [facility];  Service: Cardiovascular;  Laterality: N/A;   CARDIAC CATHETERIZATION  12/22/2014   Procedure: Left Heart Cath and Coronary Angiography;  Surgeon: Jettie Booze, MD;  Location: [place] [facility];  Service: Cardiovascular;;   CARDIAC CATHETERIZATION  06/24/2014   CORONARY ANGIOPLASTY     CSF SHUNT  1998   INGUINAL HERNIA REPAIR Right 1954   LEFT HEART CATHETERIZATION WITH CORONARY ANGIOGRAM N/A 06/24/2014   Procedure: LEFT HEART CATHETERIZATION WITH CORONARY ANGIOGRAM;  Surgeon: Jettie Booze, MD;  Location: [facility] C[facility] LAB;  Service: Cardiovascular;  Laterality: N/A;    Current Medications: No outpatient medications have been marked as taking for the 11/30/19 encounter (Appointment) with Imogene Burn, PA-C.  Allergies:   Patient has no known allergies.   Social History   Socioeconomic History   Marital status: Divorced    Spouse name: Not on file   Number of children: 2   Years of education: Not on file   Highest education level: Not on file  Occupational History   Occupation: retired  Tobacco Use   Smoking status: Never Smoker   Smokeless tobacco: Never Used  Substance and Sexual Activity   Alcohol use: Yes    Comment: 12/22/2014 "no alcohol since 1998"   Drug use: No   Sexual activity: Not Currently    Partners: Female  Other Topics Concern   Not on file  Social History Narrative   HSG, [employer]. Married- '72-'86; remarried '86-'90, single. 1 son- '73, 1 daughter-'79; 2 grandchildren. Retired after [employer]	[place], worked for [employer] before retirement. Had served in [employer] force. He did live in [place]. Lives together with his son. Very active in his church   Social Determinants of Health   Financial Resource Strain:    Difficulty of Paying Living Expenses:   Food Insecurity:    Worried About Charity fundraiser in the Last Year:    Arboriculturist in the Last Year:   Transportation Needs:    Film/video editor (Medical):    Lack of Transportation (Non-Medical):   Physical Activity:    Days of Exercise per Week:    Minutes of Exercise per Session:   Stress:    Feeling of Stress :   Social Connections:    Frequency of Communication with Friends and Family:    Frequency of Social Gatherings with Friends and Family:    Attends Religious Services:    Active Member of Clubs or Organizations:    Attends Music therapist:    Marital Status:      Family History:  The patient's ***family history includes Cancer in his brother, cousin, father, and mother; Diabetes in his father; Hypertension in his father; Pancreatic cancer in his mother; Prostate cancer in his father and paternal uncle.   ROS:   Please see the history of present illness.    ROS All other systems reviewed and are negative.   PHYSICAL EXAM:   VS:  There were no vitals taken for this visit.  Physical Exam  GEN: Well nourished, well developed, in no acute distress  HEENT: normal  Neck: no JVD, carotid bruits, or masses Cardiac:RRR; no murmurs, rubs, or gallops  Respiratory:  clear to auscultation bilaterally, normal work of breathing GI: soft, nontender, nondistended, + BS Ext: without cyanosis, clubbing, or edema, Good distal pulses bilaterally MS: no deformity or atrophy  Skin: warm and dry, no rash Neuro:  Alert and Oriented x  3, Strength and sensation are intact Psych: euthymic mood, full affect  Wt Readings from Last 3 Encounters:  10/20/19 181 lb (82.1 kg)  09/21/19 179 lb (81.2 kg)  06/03/19 191 lb (86.6 kg)      Studies/Labs Reviewed:   EKG:  EKG is*** ordered today.  The ekg ordered today demonstrates ***  Recent Labs: 06/03/2019: TSH 1.16 09/21/2019: ALT 34; Hemoglobin 11.4; Platelets 217 11/05/2019: BUN 14; Creatinine, Ser 1.63; Potassium 4.5; Sodium 141   Lipid Panel    Component Value Date/Time   CHOL 77 06/03/2019 1451   CHOL 103 03/25/2019 1008   TRIG 98.0 06/03/2019 1451   HDL 25.60 (L) 06/03/2019 1451   HDL 37 (L) 03/25/2019 1008  CHOLHDL 3 06/03/2019 1451   VLDL 19.6 06/03/2019 1451   LDLCALC 32 06/03/2019 1451   LDLCALC 44 03/25/2019 1008   LDLDIRECT 215.3 01/03/2011 1035    Additional studies/ records that were reviewed today include:  Echo 07/2017 Study Conclusions   - Left ventricle: The cavity size was normal. Wall thickness was    increased in a pattern of mild LVH. Systolic function was normal.    The estimated ejection fraction was in the range of 60% to 65%.    Wall motion was normal; there were no regional wall motion    abnormalities. Doppler parameters are consistent with abnormal    left ventricular relaxation (grade 1 diastolic dysfunction).  - Aortic valve: There was trivial regurgitation.  - Pulmonary arteries: Systolic pressure was mildly increased. PA    peak pressure: 32 mm Hg (S).          ASSESSMENT:    1. Hypotension due to drugs   2. Coronary artery disease involving native coronary artery of native heart without angina pectoris   3. Ischemic cardiomyopathy   4. Chronic diastolic CHF (congestive heart failure) ([place])   5. Essential hypertension   6. Hyperlipidemia, unspecified hyperlipidemia type      PLAN:  In order of problems listed above:  Hypotension I stopped Lasix 10/20/2019 and told him he could take it as needed.  I also stopped  hydralazine.  Creatinine had gone up to 1.88 from 1.38.  Will repeat today.  CAD with CTO of the circumflex, DES to the RCA in 2016 on aspirin Plavix Imdur and statin  Ischemic cardiomyopathy ejection fraction 15% 2016 improved to 60 to 65% 2019 on Coreg lisinopril and spironolactone  Chronic diastolic CHF  History of hypertension now hypotension  Hyperlipidemia    Medication Adjustments/Labs and Tests Ordered: Current medicines are reviewed at length with the patient today.  Concerns regarding medicines are outlined above.  Medication changes, Labs and Tests ordered today are listed in the Patient Instructions below. There are no Patient Instructions on file for this visit.   Signed, Ermalinda Barrios, PA-C  11/24/2019 3:10 PM    [facility] [place], [place], [place]  [address] Phone: [phone]; Fax: [phone]

## 2019-11-29 ENCOUNTER — Other Ambulatory Visit: Payer: Medicare HMO

## 2019-11-29 DIAGNOSIS — E1159 Type 2 diabetes mellitus with other circulatory complications: Secondary | ICD-10-CM | POA: Diagnosis not present

## 2019-11-29 NOTE — Addendum Note (Signed)
Addended by: Cresenciano Lick on: 11/29/2019 10:11 AM   Modules accepted: Orders

## 2019-11-30 ENCOUNTER — Ambulatory Visit: Payer: Medicare Other | Admitting: Physician Assistant

## 2019-11-30 DIAGNOSIS — E785 Hyperlipidemia, unspecified: Secondary | ICD-10-CM

## 2019-11-30 DIAGNOSIS — I5032 Chronic diastolic (congestive) heart failure: Secondary | ICD-10-CM

## 2019-11-30 DIAGNOSIS — I10 Essential (primary) hypertension: Secondary | ICD-10-CM

## 2019-11-30 DIAGNOSIS — I952 Hypotension due to drugs: Secondary | ICD-10-CM

## 2019-11-30 DIAGNOSIS — I2510 Atherosclerotic heart disease of native coronary artery without angina pectoris: Secondary | ICD-10-CM

## 2019-11-30 DIAGNOSIS — I255 Ischemic cardiomyopathy: Secondary | ICD-10-CM

## 2019-11-30 LAB — HEPATIC FUNCTION PANEL
AG Ratio: 1.3 (calc) (ref 1.0–2.5)
ALT: 38 U/L (ref 9–46)
AST: 29 U/L (ref 10–35)
Albumin: 4.3 g/dL (ref 3.6–5.1)
Alkaline phosphatase (APISO): 37 U/L (ref 35–144)
Bilirubin, Direct: 0.1 mg/dL (ref 0.0–0.2)
Globulin: 3.2 g/dL (calc) (ref 1.9–3.7)
Indirect Bilirubin: 0.5 mg/dL (calc) (ref 0.2–1.2)
Total Bilirubin: 0.6 mg/dL (ref 0.2–1.2)
Total Protein: 7.5 g/dL (ref 6.1–8.1)

## 2019-11-30 LAB — LIPID PANEL
Cholesterol: 102 mg/dL (ref <200)
HDL: 35 mg/dL — ABNORMAL LOW (ref >=40)
LDL Cholesterol (Calc): 48 mg/dL (calc)
Non-HDL Cholesterol (Calc): 67 mg/dL (calc) (ref <130)
Total CHOL/HDL Ratio: 2.9 (calc) (ref <5.0)
Triglycerides: 108 mg/dL (ref <150)

## 2019-11-30 LAB — BASIC METABOLIC PANEL
BUN/Creatinine Ratio: 9 (calc) (ref 6–22)
BUN: 16 mg/dL (ref 7–25)
CO2: 24 mmol/L (ref 20–32)
Calcium: 9.5 mg/dL (ref 8.6–10.3)
Chloride: 106 mmol/L (ref 98–110)
Creat: 1.8 mg/dL — ABNORMAL HIGH (ref 0.70–1.18)
Glucose, Bld: 108 mg/dL — ABNORMAL HIGH (ref 65–99)
Potassium: 5.2 mmol/L (ref 3.5–5.3)
Sodium: 139 mmol/L (ref 135–146)

## 2019-11-30 LAB — HEMOGLOBIN A1C
Hgb A1c MFr Bld: 5.3 % of total Hgb (ref <5.7)
Mean Plasma Glucose: 105 (calc)
eAG (mmol/L): 5.8 (calc)

## 2019-12-01 ENCOUNTER — Encounter: Payer: Self-pay | Admitting: Internal Medicine

## 2019-12-03 ENCOUNTER — Ambulatory Visit: Payer: Medicare HMO | Admitting: Internal Medicine

## 2019-12-09 ENCOUNTER — Other Ambulatory Visit: Payer: Self-pay | Admitting: Cardiology

## 2020-02-11 ENCOUNTER — Other Ambulatory Visit: Payer: Self-pay | Admitting: Internal Medicine

## 2020-04-21 ENCOUNTER — Other Ambulatory Visit: Payer: Self-pay | Admitting: Cardiology

## 2020-04-21 DIAGNOSIS — I2510 Atherosclerotic heart disease of native coronary artery without angina pectoris: Secondary | ICD-10-CM

## 2020-05-08 DIAGNOSIS — E119 Type 2 diabetes mellitus without complications: Secondary | ICD-10-CM | POA: Diagnosis not present

## 2020-05-08 DIAGNOSIS — H25813 Combined forms of age-related cataract, bilateral: Secondary | ICD-10-CM | POA: Diagnosis not present

## 2020-05-08 DIAGNOSIS — H401131 Primary open-angle glaucoma, bilateral, mild stage: Secondary | ICD-10-CM | POA: Diagnosis not present

## 2020-05-18 LAB — HM DIABETES EYE EXAM

## 2020-05-19 ENCOUNTER — Other Ambulatory Visit: Payer: Self-pay | Admitting: Internal Medicine

## 2020-05-19 NOTE — Telephone Encounter (Signed)
Please refill as per office routine med refill policy (all routine meds refilled for 3 mo or monthly per pt preference up to one year from last visit, then month to month grace period for 3 mo, then further med refills will have to be denied)  

## 2020-06-10 ENCOUNTER — Other Ambulatory Visit: Payer: Self-pay | Admitting: Internal Medicine

## 2020-06-10 NOTE — Telephone Encounter (Signed)
Please refill as per office routine med refill policy (all routine meds refilled for 3 mo or monthly per pt preference up to one year from last visit, then month to month grace period for 3 mo, then further med refills will have to be denied)  

## 2020-06-12 NOTE — Telephone Encounter (Signed)
Patient appt scheduled and notified that further request will be denied if not seen in the office.

## 2020-06-21 ENCOUNTER — Encounter: Payer: Self-pay | Admitting: Internal Medicine

## 2020-06-21 ENCOUNTER — Ambulatory Visit (INDEPENDENT_AMBULATORY_CARE_PROVIDER_SITE_OTHER): Payer: Medicare (Managed Care) | Admitting: Internal Medicine

## 2020-06-21 ENCOUNTER — Other Ambulatory Visit: Payer: Self-pay

## 2020-06-21 VITALS — BP 122/78 | HR 65 | Temp 98.6°F | Ht 68.0 in | Wt 179.0 lb

## 2020-06-21 DIAGNOSIS — Z23 Encounter for immunization: Secondary | ICD-10-CM | POA: Diagnosis not present

## 2020-06-21 DIAGNOSIS — E1159 Type 2 diabetes mellitus with other circulatory complications: Secondary | ICD-10-CM

## 2020-06-21 DIAGNOSIS — E785 Hyperlipidemia, unspecified: Secondary | ICD-10-CM

## 2020-06-21 DIAGNOSIS — Z0001 Encounter for general adult medical examination with abnormal findings: Secondary | ICD-10-CM

## 2020-06-21 DIAGNOSIS — E559 Vitamin D deficiency, unspecified: Secondary | ICD-10-CM

## 2020-06-21 DIAGNOSIS — E538 Deficiency of other specified B group vitamins: Secondary | ICD-10-CM | POA: Diagnosis not present

## 2020-06-21 DIAGNOSIS — N1831 Chronic kidney disease, stage 3a: Secondary | ICD-10-CM | POA: Diagnosis not present

## 2020-06-21 DIAGNOSIS — Z0000 Encounter for general adult medical examination without abnormal findings: Secondary | ICD-10-CM

## 2020-06-21 DIAGNOSIS — I10 Essential (primary) hypertension: Secondary | ICD-10-CM

## 2020-06-21 DIAGNOSIS — Z125 Encounter for screening for malignant neoplasm of prostate: Secondary | ICD-10-CM

## 2020-06-21 LAB — BASIC METABOLIC PANEL
BUN: 22 mg/dL (ref 6–23)
CO2: 28 mEq/L (ref 19–32)
Calcium: 9.3 mg/dL (ref 8.4–10.5)
Chloride: 106 mEq/L (ref 96–112)
Creatinine, Ser: 1.65 mg/dL — ABNORMAL HIGH (ref 0.40–1.50)
GFR: 41.51 mL/min — ABNORMAL LOW (ref >60.00)
Glucose, Bld: 93 mg/dL (ref 70–99)
Potassium: 5.3 mEq/L — ABNORMAL HIGH (ref 3.5–5.1)
Sodium: 139 mEq/L (ref 135–145)

## 2020-06-21 LAB — LIPID PANEL
Cholesterol: 70 mg/dL (ref 0–200)
HDL: 29.3 mg/dL — ABNORMAL LOW (ref >39.00)
LDL Cholesterol: 25 mg/dL (ref 0–99)
NonHDL: 40.49
Total CHOL/HDL Ratio: 2
Triglycerides: 76 mg/dL (ref 0.0–149.0)
VLDL: 15.2 mg/dL (ref 0.0–40.0)

## 2020-06-21 LAB — MICROALBUMIN / CREATININE URINE RATIO
Creatinine,U: 189.2 mg/dL
Microalb Creat Ratio: 0.7 mg/g (ref 0.0–30.0)
Microalb, Ur: 1.3 mg/dL (ref 0.0–1.9)

## 2020-06-21 LAB — CBC WITH DIFFERENTIAL/PLATELET
Basophils Absolute: 0 10*3/uL (ref 0.0–0.1)
Basophils Relative: 0.5 % (ref 0.0–3.0)
Eosinophils Absolute: 0.2 10*3/uL (ref 0.0–0.7)
Eosinophils Relative: 3.9 % (ref 0.0–5.0)
HCT: 35 % — ABNORMAL LOW (ref 39.0–52.0)
Hemoglobin: 11.5 g/dL — ABNORMAL LOW (ref 13.0–17.0)
Lymphocytes Relative: 31.1 % (ref 12.0–46.0)
Lymphs Abs: 1.7 10*3/uL (ref 0.7–4.0)
MCHC: 32.9 g/dL (ref 30.0–36.0)
MCV: 88.1 fl (ref 78.0–100.0)
Monocytes Absolute: 0.4 10*3/uL (ref 0.1–1.0)
Monocytes Relative: 6.8 % (ref 3.0–12.0)
Neutro Abs: 3.1 10*3/uL (ref 1.4–7.7)
Neutrophils Relative %: 57.7 % (ref 43.0–77.0)
Platelets: 181 10*3/uL (ref 150.0–400.0)
RBC: 3.98 Mil/uL — ABNORMAL LOW (ref 4.22–5.81)
RDW: 12.7 % (ref 11.5–15.5)
WBC: 5.4 10*3/uL (ref 4.0–10.5)

## 2020-06-21 LAB — HEPATIC FUNCTION PANEL
ALT: 94 U/L — ABNORMAL HIGH (ref 0–53)
AST: 75 U/L — ABNORMAL HIGH (ref 0–37)
Albumin: 4.2 g/dL (ref 3.5–5.2)
Alkaline Phosphatase: 30 U/L — ABNORMAL LOW (ref 39–117)
Bilirubin, Direct: 0.2 mg/dL (ref 0.0–0.3)
Total Bilirubin: 0.9 mg/dL (ref 0.2–1.2)
Total Protein: 7 g/dL (ref 6.0–8.3)

## 2020-06-21 LAB — URINALYSIS, ROUTINE W REFLEX MICROSCOPIC
Bilirubin Urine: NEGATIVE
Hgb urine dipstick: NEGATIVE
Ketones, ur: NEGATIVE
Leukocytes,Ua: NEGATIVE
Nitrite: NEGATIVE
Specific Gravity, Urine: >=1.03 — AB (ref 1.000–1.030)
Total Protein, Urine: NEGATIVE
Urine Glucose: NEGATIVE
Urobilinogen, UA: 0.2 (ref 0.0–1.0)
pH: 5.5 (ref 5.0–8.0)

## 2020-06-21 LAB — PSA: PSA: 1.1 ng/mL (ref 0.10–4.00)

## 2020-06-21 LAB — VITAMIN D 25 HYDROXY (VIT D DEFICIENCY, FRACTURES): VITD: 51.53 ng/mL (ref 30.00–100.00)

## 2020-06-21 LAB — TSH: TSH: 0.85 u[IU]/mL (ref 0.35–4.50)

## 2020-06-21 LAB — PHOSPHORUS: Phosphorus: 3.7 mg/dL (ref 2.3–4.6)

## 2020-06-21 LAB — HEMOGLOBIN A1C: Hgb A1c MFr Bld: 5.7 % (ref 4.6–6.5)

## 2020-06-21 LAB — VITAMIN B12: Vitamin B-12: 448 pg/mL (ref 211–911)

## 2020-06-21 NOTE — Assessment & Plan Note (Signed)
BP Readings from Last 3 Encounters:  06/21/20 122/78  10/20/19 100/66  09/21/19 (!) 88/64   Stable, pt to continue medical treatment coreg, zestril

## 2020-06-21 NOTE — Assessment & Plan Note (Signed)
Lab Results  Component Value Date   LDLCALC 25 06/21/2020   Stable, pt to continue current statin crestor 40

## 2020-06-21 NOTE — Assessment & Plan Note (Signed)
Lab Results  Component Value Date   HGBA1C 5.7 06/21/2020   Stable, pt to continue current medical treatment metformin

## 2020-06-21 NOTE — Progress Notes (Signed)
Patient ID: Jesse Woods, male   DOB: 1950-06-27, 70 y.o.   MRN: [id number]         Chief Complaint:: wellness exam, ckd, dm       HPI:  Jesse Woods is a 70 y.o. male here for wellness exam ; already up to date with preventive referrals and immunizations.  Pt denies chest pain, increased sob or doe, wheezing, orthopnea, PND, increased LE swelling, palpitations, dizziness or syncope.   Pt denies polydipsia, polyuria  Denies new focal neuro s/s.   Pt denies fever, wt loss, night sweats, loss of appetite, or other constitutional symptoms  No new other complaints  Wt Readings from Last 3 Encounters:  06/21/20 179 lb (81.2 kg)  10/20/19 181 lb (82.1 kg)  09/21/19 179 lb (81.2 kg)   BP Readings from Last 3 Encounters:  06/21/20 122/78  10/20/19 100/66  09/21/19 (!) 88/64   Immunization History  Administered Date(s) Administered  . Fluad Quad(high Dose 65+) 12/08/2018  . Influenza Split 12/02/2012  . Influenza Whole 01/12/2007, 01/06/2012  . Influenza, Seasonal, Injecte, Preservative Fre 11/22/2013  . Influenza-Unspecified 11/28/2015, 11/06/2016, 12/08/2018, 01/03/2020  . PFIZER(Purple Top)SARS-COV-2 Vaccination 06/10/2019, 07/06/2019, 01/03/2020  . Pneumococcal Conjugate-13 01/07/2017  . Pneumococcal Polysaccharide-23 04/28/2012, 12/23/2014  . Td 11/17/2009  . Tdap 06/21/2020   There are no preventive care reminders to display for this patient.    Past Medical History:  Diagnosis Date  . Arthritis    "back and left hip" (12/22/2014)  . Chronic diastolic (congestive) heart failure ([place])   . Coronary artery disease 01/2015   chronic total occlusion of mid LCx and severely diseased RCA on previous cath in February 2016. EF at that time was 15%.He was cleared by neurosurgery for DAPT and then wasseen by Dr. Irish Lack to proceed with PCI of RCA. LHC 12/22/2014 showed 90% proximal to mid RCA lesion that treated with 3.0 x 32 mm Synergy DES  . DCM (dilated cardiomyopathy) ([place])  06/22/2014   EF was 15% but now 35-40% after PCI. Echo 2019 with EF 60-65% with G1DD  . Diabetes mellitus, type II ([place])   . Hyperlipidemia   . Hypertension   . Subarachnoid hemorrhage ([place]) 1998   from HTN   Past Surgical History:  Procedure Laterality Date  . BRAIN SURGERY  1998   right frontal ventriculotomy with subsequent ventriculo-abfdominal shunt due to [facility]  . CARDIAC CATHETERIZATION N/A 12/22/2014   Procedure: Coronary Stent Intervention;  Surgeon: Jettie Booze, MD;  Location: [place] [facility] LAB;  Service: Cardiovascular;  Laterality: N/A;  . CARDIAC CATHETERIZATION  12/22/2014   Procedure: Left Heart Cath and Coronary Angiography;  Surgeon: Jettie Booze, MD;  Location: [place] [facility];  Service: Cardiovascular;;  . CARDIAC CATHETERIZATION  06/24/2014  . CORONARY ANGIOPLASTY    . CSF SHUNT  1998  . INGUINAL HERNIA REPAIR Right 1954  . LEFT HEART CATHETERIZATION WITH CORONARY ANGIOGRAM N/A 06/24/2014   Procedure: LEFT HEART CATHETERIZATION WITH CORONARY ANGIOGRAM;  Surgeon: Jettie Booze, MD;  Location: [facility] CATH LAB;  Service: Cardiovascular;  Laterality: N/A;    reports that he has never smoked. He has never used smokeless tobacco. He reports current alcohol use. He reports that he does not use drugs. family history includes Cancer in his brother, cousin, father, and mother; Diabetes in his father; Hypertension in his father; Pancreatic cancer in his mother; Prostate cancer in his father and paternal uncle. No Known Allergies Current Outpatient Medications on File Prior to  Visit  Medication Sig Dispense Refill  . aspirin 81 MG tablet Take 81 mg by mouth daily.    . carvedilol (COREG) 12.5 MG tablet TAKE 1 TABLET BY MOUTH TWICE DAILY 180 tablet 2  . clopidogrel (PLAVIX) 75 MG tablet TAKE 1 TABLET(75 MG) BY MOUTH DAILY 90 tablet 3  . ezetimibe (ZETIA) 10 MG tablet Take 1 tablet (10 mg total) by mouth daily. 90 tablet 3  . FLUAD QUADRIVALENT 0.5 ML injection      . HYDROcodone-acetaminophen (NORCO/VICODIN) 5-325 MG tablet     . isosorbide mononitrate (IMDUR) 30 MG 24 hr tablet TAKE 1 TABLET(30 MG) BY MOUTH DAILY 90 tablet 3  . lisinopril (ZESTRIL) 40 MG tablet TAKE 1 TABLET BY MOUTH EVERY DAY 90 tablet 3  . metFORMIN (GLUCOPHAGE) 1000 MG tablet TAKE 1 TABLET BY MOUTH TWICE DAILY WITH FOOD 30 tablet 0  . Multiple Vitamins-Minerals (MULTIVITAMIN WITH MINERALS) tablet Take 1 tablet by mouth daily.    . nitroGLYCERIN (NITROSTAT) 0.4 MG SL tablet Place 1 tablet (0.4 mg total) under the tongue every 5 (five) minutes as needed for chest pain (x 3 tabs). 60 tablet 3  . rosuvastatin (CRESTOR) 40 MG tablet TAKE 1 TABLET BY MOUTH EVERY DAY 90 tablet 1  . spironolactone (ALDACTONE) 25 MG tablet TAKE 1/2 TABLET(12.5 MG) BY MOUTH DAILY 15 tablet 3  . tetrahydrozoline-zinc (VISINE-AC) 0.05-0.25 % ophthalmic solution Place 1 drop into both eyes daily as needed (allergy irritation.).    Marland Kitchen furosemide (LASIX) 20 MG tablet Take 1 tablet (20 mg total) by mouth as needed for edema. 90 tablet 3   No current facility-administered medications on file prior to visit.        ROS:  All others reviewed and negative.  Objective        PE:  BP 122/78   Pulse 65   Temp 98.6 F (37 C) (Oral)   Ht 5\' 8"  (1.727 m)   Wt 179 lb (81.2 kg)   SpO2 97%   BMI 27.22 kg/m                 Constitutional: Pt appears in NAD               HENT: Head: NCAT.                Right Ear: External ear normal.                 Left Ear: External ear normal.                Eyes: . Pupils are equal, round, and reactive to light. Conjunctivae and EOM are normal               Nose: without d/c or deformity               Neck: Neck supple. Gross normal ROM               Cardiovascular: Normal rate and regular rhythm.                 Pulmonary/Chest: Effort normal and breath sounds without rales or wheezing.                Abd:  Soft, NT, ND, + BS, no organomegaly               Neurological: Pt is  alert. At baseline orientation, motor grossly intact  Skin: Skin is warm. No rashes, no other new lesions, LE edema - none               Psychiatric: Pt behavior is normal without agitation   Micro: none  Cardiac tracings I have personally interpreted today:  none  Pertinent Radiological findings (summarize): none   Lab Results  Component Value Date   WBC 5.4 06/21/2020   HGB 11.5 (L) 06/21/2020   HCT 35.0 (L) 06/21/2020   PLT 181.0 06/21/2020   GLUCOSE 93 06/21/2020   CHOL 70 06/21/2020   TRIG 76.0 06/21/2020   HDL 29.30 (L) 06/21/2020   LDLDIRECT 215.3 01/03/2011   LDLCALC 25 06/21/2020   ALT 94 (H) 06/21/2020   AST 75 (H) 06/21/2020   NA 139 06/21/2020   K 5.3 No hemolysis seen (H) 06/21/2020   CL 106 06/21/2020   CREATININE 1.65 (H) 06/21/2020   BUN 22 06/21/2020   CO2 28 06/21/2020   TSH 0.85 06/21/2020   PSA 1.10 06/21/2020   INR 1.1 (H) 12/19/2014   HGBA1C 5.7 06/21/2020   MICROALBUR 1.3 06/21/2020   Assessment/Plan:  Jesse Woods is a 70 y.o. Black or African American [2] male with  has a past medical history of Arthritis, Chronic diastolic (congestive) heart failure ([place]), Coronary artery disease (01/2015), DCM (dilated cardiomyopathy) ([place]) (06/22/2014), Diabetes mellitus, type II ([place]), Hyperlipidemia, Hypertension, and Subarachnoid hemorrhage ([place]) (1998).  Preventative health care Age and sex appropriate education and counseling updated with regular exercise and diet Referrals for preventative services - none needed Immunizations addressed - none needed Smoking counseling  - none needed Evidence for depression or other mood disorder - none significant Most recent labs reviewed. I have personally reviewed and have noted: 1) the patient's medical and social history 2) The patient's current medications and supplements 3) The patient's height, weight, and BMI have been recorded in the chart   Diabetes mellitus ([place]) Lab Results   Component Value Date   HGBA1C 5.7 06/21/2020   Stable, pt to continue current medical treatment metformin   Dyslipidemia Lab Results  Component Value Date   [place] 25 06/21/2020   Stable, pt to continue current statin crestor 40  Essential hypertension BP Readings from Last 3 Encounters:  06/21/20 122/78  10/20/19 100/66  09/21/19 (!) 88/64   Stable, pt to continue medical treatment coreg, zestril   Stage 3a chronic kidney disease ([place]) Lab Results  Component Value Date   CREATININE 1.65 (H) 06/21/2020   Stable overall, cont to avoid nephrotoxins  Followup: Return in about 6 months (around 12/22/2020).  Cathlean Cower, MD 06/21/2020 10:36 PM [facility]

## 2020-06-21 NOTE — Assessment & Plan Note (Signed)

## 2020-06-21 NOTE — Patient Instructions (Addendum)
You had the Tdap tetanus shot today  Please check with your insurance to see if they cover the Shingrix  Please continue all other medications as before, and refills have been done if requested.  Please have the pharmacy call with any other refills you may need.  Please continue your efforts at being more active, low cholesterol diet, and weight control.  You are otherwise up to date with prevention measures today.  Please keep your appointments with your specialists as you may have planned  Please go to the LAB at the blood drawing area for the tests to be done  You will be contacted by phone if any changes need to be made immediately.  Otherwise, you will receive a letter about your results with an explanation, but please check with MyChart first.  Please remember to sign up for MyChart if you have not done so, as this will be important to you in the future with finding out test results, communicating by private email, and scheduling acute appointments online when needed.  Please make an Appointment to return in 6 months, or sooner if needed

## 2020-06-21 NOTE — Assessment & Plan Note (Signed)
Lab Results  Component Value Date   CREATININE 1.65 (H) 06/21/2020   Stable overall, cont to avoid nephrotoxins

## 2020-06-22 LAB — PTH, INTACT AND CALCIUM
Calcium: 9.3 mg/dL (ref 8.6–10.3)
PTH: 52 pg/mL (ref 16–77)

## 2020-06-29 ENCOUNTER — Other Ambulatory Visit: Payer: Self-pay | Admitting: Internal Medicine

## 2020-06-29 NOTE — Telephone Encounter (Signed)
Please refill as per office routine med refill policy (all routine meds refilled for 3 mo or monthly per pt preference up to one year from last visit, then month to month grace period for 3 mo, then further med refills will have to be denied)  

## 2020-07-08 ENCOUNTER — Other Ambulatory Visit: Payer: Self-pay | Admitting: Internal Medicine

## 2020-07-08 NOTE — Telephone Encounter (Signed)
Please refill as per office routine med refill policy (all routine meds refilled for 3 mo or monthly per pt preference up to one year from last visit, then month to month grace period for 3 mo, then further med refills will have to be denied)  

## 2020-07-17 ENCOUNTER — Other Ambulatory Visit: Payer: Self-pay | Admitting: Cardiology

## 2020-07-18 ENCOUNTER — Other Ambulatory Visit: Payer: Self-pay | Admitting: Cardiology

## 2020-08-03 ENCOUNTER — Ambulatory Visit (INDEPENDENT_AMBULATORY_CARE_PROVIDER_SITE_OTHER): Payer: Medicare (Managed Care)

## 2020-08-03 ENCOUNTER — Other Ambulatory Visit: Payer: Self-pay

## 2020-08-03 VITALS — BP 118/80 | HR 83 | Temp 97.6°F | Ht 68.0 in | Wt 171.2 lb

## 2020-08-03 DIAGNOSIS — Z0000 Encounter for general adult medical examination without abnormal findings: Secondary | ICD-10-CM

## 2020-08-03 NOTE — Progress Notes (Signed)
Subjective:   Jesse Woods is a 69 y.o. male who presents for Medicare Annual/Subsequent preventive examination.  Review of Systems    No ROS. Medicare Wellness Visit. Additional risk factors are reflected in social history. Cardiac Risk Factors include: advanced age (>42men, >53 women);diabetes mellitus;dyslipidemia;hypertension;family history of premature cardiovascular disease;male gender     Objective:    Today's Vitals   08/03/20 1233  BP: 118/80  Pulse: 83  Temp: 97.6 F (36.4 C)  SpO2: 98%  Weight: 171 lb 3.2 oz (77.7 kg)  Height: 5\' 8"  (1.727 m)  PainSc: 0-No pain   Body mass index is 26.03 kg/m.  Advanced Directives 08/03/2020 06/06/2016 11/29/2015 12/22/2014 06/24/2014  Does Patient Have a Medical Advance Directive? No No No No No  Would patient like information on creating a medical advance directive? No - Patient declined - Yes - Educational materials given No - patient declined information No - patient declined information    Current Medications (verified) Outpatient Encounter Medications as of 08/03/2020  Medication Sig  . aspirin 81 MG tablet Take 81 mg by mouth daily.  . carvedilol (COREG) 12.5 MG tablet TAKE 1 TABLET BY MOUTH TWICE DAILY  . clopidogrel (PLAVIX) 75 MG tablet TAKE 1 TABLET(75 MG) BY MOUTH DAILY  . ezetimibe (ZETIA) 10 MG tablet Take 1 tablet (10 mg total) by mouth daily.  Marland Kitchen FLUAD QUADRIVALENT 0.5 ML injection   . furosemide (LASIX) 20 MG tablet Take 1 tablet (20 mg total) by mouth as needed for edema.  . isosorbide mononitrate (IMDUR) 30 MG 24 hr tablet TAKE 1 TABLET(30 MG) BY MOUTH DAILY  . lisinopril (ZESTRIL) 40 MG tablet TAKE 1 TABLET BY MOUTH EVERY DAY  . metFORMIN (GLUCOPHAGE) 1000 MG tablet TAKE 1 TABLET BY MOUTH TWICE DAILY WITH FOOD  . Multiple Vitamins-Minerals (MULTIVITAMIN WITH MINERALS) tablet Take 1 tablet by mouth daily.  . nitroGLYCERIN (NITROSTAT) 0.4 MG SL tablet Place 1 tablet (0.4 mg total) under the tongue every 5  (five) minutes as needed for chest pain (x 3 tabs).  . rosuvastatin (CRESTOR) 40 MG tablet TAKE 1 TABLET BY MOUTH EVERY DAY  . spironolactone (ALDACTONE) 25 MG tablet TAKE 1/2 TABLET(12.5 MG) BY MOUTH DAILY  . tetrahydrozoline-zinc (VISINE-AC) 0.05-0.25 % ophthalmic solution Place 1 drop into both eyes daily as needed (allergy irritation.).  . [DISCONTINUED] HYDROcodone-acetaminophen (NORCO/VICODIN) 5-325 MG tablet  (Patient not taking: Reported on 08/03/2020)   No facility-administered encounter medications on file as of 08/03/2020.    Allergies (verified) Patient has no known allergies.   History: Past Medical History:  Diagnosis Date  . Arthritis    "back and left hip" (12/22/2014)  . Chronic diastolic (congestive) heart failure ([place])   . Coronary artery disease 01/2015   chronic total occlusion of mid LCx and severely diseased RCA on previous cath in February 2016. EF at that time was 15%.He was cleared by neurosurgery for DAPT and then wasseen by Dr. Irish Lack to proceed with PCI of RCA. LHC 12/22/2014 showed 90% proximal to mid RCA lesion that treated with 3.0 x 32 mm Synergy DES  . DCM (dilated cardiomyopathy) ([place]) 06/22/2014   EF was 15% but now 35-40% after PCI. Echo 2019 with EF 60-65% with G1DD  . Diabetes mellitus, type II ([place])   . Hyperlipidemia   . Hypertension   . Subarachnoid hemorrhage ([place]) 1998   from HTN   Past Surgical History:  Procedure Laterality Date  . BRAIN SURGERY  1998   right frontal ventriculotomy  with subsequent ventriculo-abfdominal shunt due to [facility]  . CARDIAC CATHETERIZATION N/A 12/22/2014   Procedure: Coronary Stent Intervention;  Surgeon: Jettie Booze, MD;  Location: [place] [facility] LAB;  Service: Cardiovascular;  Laterality: N/A;  . CARDIAC CATHETERIZATION  12/22/2014   Procedure: Left Heart Cath and Coronary Angiography;  Surgeon: Jettie Booze, MD;  Location: [place] [facility] LAB;  Service: Cardiovascular;;  . CARDIAC CATHETERIZATION   06/24/2014  . CORONARY ANGIOPLASTY    . CSF SHUNT  1998  . INGUINAL HERNIA REPAIR Right 1954  . LEFT HEART CATHETERIZATION WITH CORONARY ANGIOGRAM N/A 06/24/2014   Procedure: LEFT HEART CATHETERIZATION WITH CORONARY ANGIOGRAM;  Surgeon: Jettie Booze, MD;  Location: [facility] CATH LAB;  Service: Cardiovascular;  Laterality: N/A;   Family History  Problem Relation Age of Onset  . Cancer Mother        pancreatic  . Pancreatic cancer Mother   . Diabetes Father   . Hypertension Father   . Cancer Father        prostate cancer  . Prostate cancer Father   . Cancer Brother        prostate cancer - cause of death  . Cancer Cousin        prostate cancer  . Prostate cancer Paternal Uncle    Social History   Socioeconomic History  . Marital status: Divorced    Spouse name: Not on file  . Number of children: 2  . Years of education: Not on file  . Highest education level: Not on file  Occupational History  . Occupation: retired  Tobacco Use  . Smoking status: Never Smoker  . Smokeless tobacco: Never Used  Substance and Sexual Activity  . Alcohol use: Yes    Comment: 12/22/2014 "no alcohol since 1998"  . Drug use: No  . Sexual activity: Not Currently    Partners: Female  Other Topics Concern  . Not on file  Social History Narrative   HSG, Graduated [employer]. Married- '72-'86; remarried '86-'90, single. 1 son- '73, 1 daughter-'79; 2 grandchildren. Retired after [employer]	[place], worked for [employer] before retirement. Had served in [employer] force. He did live in [place]. Lives together with his son. Very active in his church   Social Determinants of Health   Financial Resource Strain: Low Risk   . Difficulty of Paying Living Expenses: Not hard at all  Food Insecurity: No Food Insecurity  . Worried About Charity fundraiser in the Last Year: Never true  . Ran Out of Food in the Last Year: Never true  Transportation Needs: No Transportation Needs   . Lack of Transportation (Medical): No  . Lack of Transportation (Non-Medical): No  Physical Activity: Sufficiently Active  . Days of Exercise per Week: 5 days  . Minutes of Exercise per Session: 30 min  Stress: No Stress Concern Present  . Feeling of Stress : Not at all  Social Connections: Moderately Integrated  . Frequency of Communication with Friends and Family: More than three times a week  . Frequency of Social Gatherings with Friends and Family: More than three times a week  . Attends Religious Services: More than 4 times per year  . Active Member of Clubs or Organizations: Yes  . Attends Archivist Meetings: More than 4 times per year  . Marital Status: Divorced    Tobacco Counseling Counseling given: Not Answered   Clinical Intake:  Pre-visit preparation completed: Yes  Pain : No/denies pain Pain  Score: 0-No pain     BMI - recorded: 26.03 Nutritional Status: BMI 25 -29 Overweight Nutritional Risks: None Diabetes: Yes CBG done?: No Did pt. bring in CBG monitor from home?: No  How often do you need to have someone help you when you read instructions, pamphlets, or other written materials from your doctor or pharmacy?: 1 - Never What is the last grade level you completed in school?: Bachelor's Degree; Gaffer; Worked on [employer]s  Diabetic? yes  Interpreter Needed?: No  Information entered by :: [employer], LPN   Activities of Daily Living In your present state of health, do you have any difficulty performing the following activities: 08/03/2020 06/21/2020  Hearing? N N  Vision? N N  Difficulty concentrating or making decisions? N N  Walking or climbing stairs? N N  Dressing or bathing? N N  Doing errands, shopping? N N  Preparing Food and eating ? N -  Using the Toilet? N -  In the past six months, have you accidently leaked urine? N -  Do you have problems with loss of bowel control? N -  Managing your Medications? N -   Managing your Finances? N -  Housekeeping or managing your Housekeeping? N -  Some recent data might be hidden    Patient Care Team: Biagio Borg, MD as PCP - General (Internal Medicine) [name], MD as PCP - Cardiology (Cardiology) Clent Jacks, MD (Ophthalmology)  Indicate any recent Medical Services you may have received from other than [facility] providers in the past year (date may be approximate).     Assessment:   This is a routine wellness examination for [place].  Hearing/Vision screen No exam data present  Dietary issues and exercise activities discussed: Current Exercise Habits: Home exercise routine, Type of exercise: walking;treadmill;stretching;strength training/weights, Time (Minutes): 30, Frequency (Times/Week): 5, Weekly Exercise (Minutes/Week): 150, Intensity: Moderate, Exercise limited by: cardiac condition(s);respiratory conditions(s)  Goals    . patient      Stay single; play basketball x 2 days a week        Depression Screen PHQ 2/9 Scores 06/21/2020 06/21/2020 06/03/2019 02/14/2017 03/20/2016 11/29/2015  PHQ - 2 Score 0 0 0 0 0 0    Fall Risk Fall Risk  08/03/2020 06/21/2020 06/21/2020 06/03/2019 02/14/2017  Falls in the past year? 0 0 0 0 No  Number falls in past yr: 0 - 0 - -  Injury with Fall? 0 - 0 - -  Risk for fall due to : No Fall Risks - - - -  Follow up Falls evaluation completed - - - -    FALL RISK PREVENTION PERTAINING TO THE HOME:  Any stairs in or around the home? No  If so, are there any without handrails? No  Home free of loose throw rugs in walkways, pet beds, electrical cords, etc? Yes  Adequate lighting in your home to reduce risk of falls? Yes   ASSISTIVE DEVICES UTILIZED TO PREVENT FALLS:  Life alert? No  Use of a cane, walker or w/c? No  Grab bars in the bathroom? No  Shower chair or bench in shower? No  Elevated toilet seat or a handicapped toilet? No   TIMED UP AND GO:  Was the test performed? No .  Length of time  to ambulate 10 feet: 0 sec.   Gait steady and fast without use of assistive device  Cognitive Function: Normal cognitive status assessed by direct observation by this Nurse Health Advisor. No abnormalities found.  Immunizations Immunization History  Administered Date(s) Administered  . Fluad Quad(high Dose 65+) 12/08/2018  . Influenza Split 12/02/2012  . Influenza Whole 01/12/2007, 01/06/2012  . Influenza, Seasonal, Injecte, Preservative Fre 11/22/2013  . Influenza-Unspecified 11/28/2015, 11/06/2016, 12/08/2018, 01/03/2020  . PFIZER(Purple Top)SARS-COV-2 Vaccination 06/10/2019, 07/06/2019, 01/03/2020  . Pneumococcal Conjugate-13 01/07/2017  . Pneumococcal Polysaccharide-23 04/28/2012, 12/23/2014  . Td 11/17/2009  . Tdap 06/21/2020    TDAP status: Up to date  Flu Vaccine status: Up to date  Pneumococcal vaccine status: Up to date  Covid-19 vaccine status: Completed vaccines  Qualifies for Shingles Vaccine? Yes   Zostavax completed No   Shingrix Completed?: No.    Education has been provided regarding the importance of this vaccine. Patient has been advised to call insurance company to determine out of pocket expense if they have not yet received this vaccine. Advised may also receive vaccine at local pharmacy or Health Dept. Verbalized acceptance and understanding.  Screening Tests Health Maintenance  Topic Date Due  . INFLUENZA VACCINE  11/06/2020  . HEMOGLOBIN A1C  12/22/2020  . OPHTHALMOLOGY EXAM  05/18/2021  . COLONOSCOPY (Pts 45-63yrs Insurance coverage will need to be confirmed)  06/06/2021  . FOOT EXAM  06/21/2021  . TETANUS/TDAP  06/22/2030  . COVID-19 Vaccine  Completed  . Hepatitis C Screening  Completed  . PNA vac Low Risk Adult  Completed  . HPV VACCINES  Aged Out    Health Maintenance  There are no preventive care reminders to display for this patient.  Colorectal cancer screening: Type of screening: Colonoscopy. Completed 06/06/2016. Repeat  every 5 years  Lung Cancer Screening: (Low Dose CT Chest recommended if Age 50-80 years, 30 pack-year currently smoking OR have quit w/in 15years.) does not qualify.   Lung Cancer Screening Referral: no  Additional Screening:  Hepatitis C Screening: does qualify; Completed yes  Vision Screening: Recommended annual ophthalmology exams for early detection of glaucoma and other disorders of the eye. Is the patient up to date with their annual eye exam?  Yes  Who is the provider or what is the name of the office in which the patient attends annual eye exams? Clent Jacks, MD. If pt is not established with a provider, would they like to be referred to a provider to establish care? No .   Dental Screening: Recommended annual dental exams for proper oral hygiene  Community Resource Referral / Chronic Care Management: CRR required this visit?  No   CCM required this visit?  No      Plan:     I have personally reviewed and noted the following in the patient's chart:   . Medical and social history . Use of alcohol, tobacco or illicit drugs  . Current medications and supplements . Functional ability and status . Nutritional status . Physical activity . Advanced directives . List of other physicians . Hospitalizations, surgeries, and ER visits in previous 12 months . Vitals . Screenings to include cognitive, depression, and falls . Referrals and appointments  In addition, I have reviewed and discussed with patient certain preventive protocols, quality metrics, and best practice recommendations. A written personalized care plan for preventive services as well as general preventive health recommendations were provided to patient.     Sheral Flow, LPN   [id number]   Nurse Notes:  Medications reviewed with patient; no opioid use noted.

## 2020-08-03 NOTE — Patient Instructions (Signed)
Jesse Woods , Thank you for taking time to come for your Medicare Wellness Visit. I appreciate your ongoing commitment to your health goals. Please review the following plan we discussed and let me know if I can assist you in the future.   Screening recommendations/referrals: Colonoscopy: 06/06/2016; due every 5 years (2023) Recommended yearly ophthalmology/optometry visit for glaucoma screening and checkup Recommended yearly dental visit for hygiene and checkup  Vaccinations: Influenza vaccine: 01/03/2020 Pneumococcal vaccine: 12/23/2014, 01/07/2017 Tdap vaccine: 06/21/2020' due every 10 years Shingles vaccine: Please call your insurance company to determine your out of pocket expense for the Shingrix vaccine. You may receive this vaccine at your local pharmacy. Covid-19: 06/10/2019, 07/06/2019, 01/03/2020  Advanced directives: Advance directive discussed with you today. Even though you declined this today please call our office should you change your mind and we can give you the proper paperwork for you to fill out.  Conditions/risks identified: Yes; Reviewed health maintenance screenings with patient today and relevant education, vaccines, and/or referrals were provided. Please continue to do your personal lifestyle choices by: daily care of teeth and gums, regular physical activity (goal should be 5 days a week for 30 minutes), eat a healthy diet, avoid tobacco and drug use, limiting any alcohol intake, taking a low-dose aspirin (if not allergic or have been advised by your provider otherwise) and taking vitamins and minerals as recommended by your provider. Continue doing brain stimulating activities (puzzles, reading, adult coloring books, staying active) to keep memory sharp. Continue to eat heart healthy diet (full of fruits, vegetables, whole grains, lean protein, water--limit salt, fat, and sugar intake) and increase physical activity as tolerated.  Next appointment: Please schedule your next  Medicare Wellness Visit with your Nurse Health Advisor in 1 year by calling [phone]. Preventive Care 33 Years and Older, Male Preventive care refers to lifestyle choices and visits with your health care provider that can promote health and wellness. What does preventive care include?  A yearly physical exam. This is also called an annual well check.  Dental exams once or twice a year.  Routine eye exams. Ask your health care provider how often you should have your eyes checked.  Personal lifestyle choices, including:  Daily care of your teeth and gums.  Regular physical activity.  Eating a healthy diet.  Avoiding tobacco and drug use.  Limiting alcohol use.  Practicing safe sex.  Taking low doses of aspirin every day.  Taking vitamin and mineral supplements as recommended by your health care provider. What happens during an annual well check? The services and screenings done by your health care provider during your annual well check will depend on your age, overall health, lifestyle risk factors, and family history of disease. Counseling  Your health care provider may ask you questions about your:  Alcohol use.  Tobacco use.  Drug use.  Emotional well-being.  Home and relationship well-being.  Sexual activity.  Eating habits.  History of falls.  Memory and ability to understand (cognition).  Work and work Statistician. Screening  You may have the following tests or measurements:  Height, weight, and BMI.  Blood pressure.  Lipid and cholesterol levels. These may be checked every 5 years, or more frequently if you are over 72 years old.  Skin check.  Lung cancer screening. You may have this screening every year starting at age 72 if you have a 30-pack-year history of smoking and currently smoke or have quit within the past 15 years.  Fecal occult blood test (  FOBT) of the stool. You may have this test every year starting at age 72.  Flexible  sigmoidoscopy or colonoscopy. You may have a sigmoidoscopy every 5 years or a colonoscopy every 10 years starting at age 3.  Prostate cancer screening. Recommendations will vary depending on your family history and other risks.  Hepatitis C blood test.  Hepatitis B blood test.  Sexually transmitted disease (STD) testing.  Diabetes screening. This is done by checking your blood sugar (glucose) after you have not eaten for a while (fasting). You may have this done every 1-3 years.  Abdominal aortic aneurysm (AAA) screening. You may need this if you are a current or former smoker.  Osteoporosis. You may be screened starting at age 31 if you are at high risk. Talk with your health care provider about your test results, treatment options, and if necessary, the need for more tests. Vaccines  Your health care provider may recommend certain vaccines, such as:  Influenza vaccine. This is recommended every year.  Tetanus, diphtheria, and acellular pertussis (Tdap, Td) vaccine. You may need a Td booster every 10 years.  Zoster vaccine. You may need this after age 18.  Pneumococcal 13-valent conjugate (PCV13) vaccine. One dose is recommended after age 23.  Pneumococcal polysaccharide (PPSV23) vaccine. One dose is recommended after age 37. Talk to your health care provider about which screenings and vaccines you need and how often you need them. This information is not intended to replace advice given to you by your health care provider. Make sure you discuss any questions you have with your health care provider. Document Released: 04/21/2015 Document Revised: 12/13/2015 Document Reviewed: 01/24/2015 Elsevier Interactive Patient Education  2017 [place] Prevention in the Home Falls can cause injuries. They can happen to people of all ages. There are many things you can do to make your home safe and to help prevent falls. What can I do on the outside of my home?  Regularly fix the  edges of walkways and driveways and fix any cracks.  Remove anything that might make you trip as you walk through a door, such as a raised step or threshold.  Trim any bushes or trees on the path to your home.  Use bright outdoor lighting.  Clear any walking paths of anything that might make someone trip, such as rocks or tools.  Regularly check to see if handrails are loose or broken. Make sure that both sides of any steps have handrails.  Any raised decks and porches should have guardrails on the edges.  Have any leaves, snow, or ice cleared regularly.  Use sand or salt on walking paths during winter.  Clean up any spills in your garage right away. This includes oil or grease spills. What can I do in the bathroom?  Use night lights.  Install grab bars by the toilet and in the tub and shower. Do not use towel bars as grab bars.  Use non-skid mats or decals in the tub or shower.  If you need to sit down in the shower, use a plastic, non-slip stool.  Keep the floor dry. Clean up any water that spills on the floor as soon as it happens.  Remove soap buildup in the tub or shower regularly.  Attach bath mats securely with double-sided non-slip rug tape.  Do not have throw rugs and other things on the floor that can make you trip. What can I do in the bedroom?  Use night lights.  Make  sure that you have a light by your bed that is easy to reach.  Do not use any sheets or blankets that are too big for your bed. They should not hang down onto the floor.  Have a firm chair that has side arms. You can use this for support while you get dressed.  Do not have throw rugs and other things on the floor that can make you trip. What can I do in the kitchen?  Clean up any spills right away.  Avoid walking on wet floors.  Keep items that you use a lot in easy-to-reach places.  If you need to reach something above you, use a strong step stool that has a grab bar.  Keep  electrical cords out of the way.  Do not use floor polish or wax that makes floors slippery. If you must use wax, use non-skid floor wax.  Do not have throw rugs and other things on the floor that can make you trip. What can I do with my stairs?  Do not leave any items on the stairs.  Make sure that there are handrails on both sides of the stairs and use them. Fix handrails that are broken or loose. Make sure that handrails are as long as the stairways.  Check any carpeting to make sure that it is firmly attached to the stairs. Fix any carpet that is loose or worn.  Avoid having throw rugs at the top or bottom of the stairs. If you do have throw rugs, attach them to the floor with carpet tape.  Make sure that you have a light switch at the top of the stairs and the bottom of the stairs. If you do not have them, ask someone to add them for you. What else can I do to help prevent falls?  Wear shoes that:  Do not have high heels.  Have rubber bottoms.  Are comfortable and fit you well.  Are closed at the toe. Do not wear sandals.  If you use a stepladder:  Make sure that it is fully opened. Do not climb a closed stepladder.  Make sure that both sides of the stepladder are locked into place.  Ask someone to hold it for you, if possible.  Clearly mark and make sure that you can see:  Any grab bars or handrails.  First and last steps.  Where the edge of each step is.  Use tools that help you move around (mobility aids) if they are needed. These include:  Canes.  Walkers.  Scooters.  Crutches.  Turn on the lights when you go into a dark area. Replace any light bulbs as soon as they burn out.  Set up your furniture so you have a clear path. Avoid moving your furniture around.  If any of your floors are uneven, fix them.  If there are any pets around you, be aware of where they are.  Review your medicines with your doctor. Some medicines can make you feel dizzy.  This can increase your chance of falling. Ask your doctor what other things that you can do to help prevent falls. This information is not intended to replace advice given to you by your health care provider. Make sure you discuss any questions you have with your health care provider. Document Released: 01/19/2009 Document Revised: 08/31/2015 Document Reviewed: 04/29/2014 Elsevier Interactive Patient Education  2017 [employer].

## 2020-08-14 ENCOUNTER — Other Ambulatory Visit: Payer: Self-pay

## 2020-08-14 MED ORDER — CARVEDILOL 12.5 MG PO TABS: ORAL_TABLET | ORAL | 0 refills | Status: DC

## 2020-08-14 NOTE — Telephone Encounter (Signed)
Pt's medication was sent to pt's pharmacy as requested. Confirmation received.  °

## 2020-08-16 ENCOUNTER — Other Ambulatory Visit: Payer: Self-pay

## 2020-08-16 DIAGNOSIS — I2510 Atherosclerotic heart disease of native coronary artery without angina pectoris: Secondary | ICD-10-CM

## 2020-08-16 MED ORDER — ROSUVASTATIN CALCIUM 40 MG PO TABS: 40.0000 mg | ORAL_TABLET | Freq: Every day | ORAL | 0 refills | Status: DC

## 2020-08-16 NOTE — Telephone Encounter (Signed)
Pt's medication was sent to pt's pharmacy as requested. Confirmation received.  °

## 2020-08-30 ENCOUNTER — Ambulatory Visit: Payer: Medicare (Managed Care) | Admitting: Cardiology

## 2020-09-14 ENCOUNTER — Ambulatory Visit: Payer: Medicare (Managed Care) | Admitting: Cardiology

## 2020-09-14 ENCOUNTER — Encounter: Payer: Self-pay | Admitting: Cardiology

## 2020-09-14 ENCOUNTER — Other Ambulatory Visit: Payer: Self-pay

## 2020-09-14 ENCOUNTER — Encounter (INDEPENDENT_AMBULATORY_CARE_PROVIDER_SITE_OTHER): Payer: Self-pay

## 2020-09-14 VITALS — BP 106/66 | HR 78 | Ht 68.0 in | Wt 177.0 lb

## 2020-09-14 DIAGNOSIS — I255 Ischemic cardiomyopathy: Secondary | ICD-10-CM

## 2020-09-14 DIAGNOSIS — E785 Hyperlipidemia, unspecified: Secondary | ICD-10-CM

## 2020-09-14 DIAGNOSIS — I10 Essential (primary) hypertension: Secondary | ICD-10-CM

## 2020-09-14 DIAGNOSIS — I2510 Atherosclerotic heart disease of native coronary artery without angina pectoris: Secondary | ICD-10-CM

## 2020-09-14 DIAGNOSIS — I5032 Chronic diastolic (congestive) heart failure: Secondary | ICD-10-CM | POA: Diagnosis not present

## 2020-09-14 LAB — BASIC METABOLIC PANEL
BUN/Creatinine Ratio: 11 (ref 10–24)
BUN: 19 mg/dL (ref 8–27)
CO2: 19 mmol/L — ABNORMAL LOW (ref 20–29)
Calcium: 9.7 mg/dL (ref 8.6–10.2)
Chloride: 108 mmol/L — ABNORMAL HIGH (ref 96–106)
Creatinine, Ser: 1.71 mg/dL — ABNORMAL HIGH (ref 0.76–1.27)
Glucose: 107 mg/dL — ABNORMAL HIGH (ref 65–99)
Potassium: 5.2 mmol/L (ref 3.5–5.2)
Sodium: 142 mmol/L (ref 134–144)
eGFR: 42 mL/min/{1.73_m2} — ABNORMAL LOW (ref >59)

## 2020-09-14 MED ORDER — LISINOPRIL 40 MG PO TABS: 40.0000 mg | ORAL_TABLET | Freq: Every day | ORAL | 3 refills | Status: DC

## 2020-09-14 MED ORDER — ROSUVASTATIN CALCIUM 40 MG PO TABS: 40.0000 mg | ORAL_TABLET | Freq: Every day | ORAL | 3 refills | Status: DC

## 2020-09-14 MED ORDER — SPIRONOLACTONE 25 MG PO TABS: ORAL_TABLET | ORAL | 3 refills | Status: DC

## 2020-09-14 MED ORDER — FUROSEMIDE 20 MG PO TABS: 20.0000 mg | ORAL_TABLET | ORAL | 3 refills | Status: DC | PRN

## 2020-09-14 MED ORDER — EZETIMIBE 10 MG PO TABS: 10.0000 mg | ORAL_TABLET | Freq: Every day | ORAL | 3 refills | Status: DC

## 2020-09-14 MED ORDER — ISOSORBIDE MONONITRATE ER 30 MG PO TB24: ORAL_TABLET | ORAL | 3 refills | Status: DC

## 2020-09-14 MED ORDER — CARVEDILOL 12.5 MG PO TABS: ORAL_TABLET | ORAL | 3 refills | Status: DC

## 2020-09-14 NOTE — Patient Instructions (Signed)
Medication Instructions:  Your physician recommends that you continue on your current medications as directed. Please refer to the Current Medication list given to you today.  *If you need a refill on your cardiac medications before your next appointment, please call your pharmacy*   Lab Work: TODAY: BMET If you have labs (blood work) drawn today and your tests are completely normal, you will receive your results only by: [place] (if you have MyChart) OR A paper copy in the mail If you have any lab test that is abnormal or we need to change your treatment, we will call you to review the results.   Testing/Procedures: NONE   Follow-Up: At [facility], you and your health needs are our priority.  As part of our continuing mission to provide you with exceptional heart care, we have created designated Provider Care Teams.  These Care Teams include your primary Cardiologist (physician) and Advanced Practice Providers (APPs -  Physician Assistants and Nurse Practitioners) who all work together to provide you with the care you need, when you need it.  Your next appointment:   1 year(s)  The format for your next appointment:   Virtual Visit   Provider:   You may see Fransico Him, MD or one of the following Advanced Practice Providers on your designated Care Team:   Melina Copa, PA-C Ermalinda Barrios, PA-C

## 2020-09-14 NOTE — Progress Notes (Addendum)
Date:  09/14/2020   ID:  IDAN PRIME, DOB [birth year], MRN [id number]  PCP:  Biagio Borg, MD  Cardiologist:  Fransico Him, MD  Electrophysiologist:  None   Chief Complaint:  CAD, HTN, HLD, CHF  History of Present Illness:    Jesse Woods is a 70 y.o. male  with a hx of HTN, HL, DM2, remote history of subarachnoid hemorrhage, CAD and ischemic cardiomyopathy. He was noted to have chronic total occlusion of mid LCx and severely diseased RCA on previous cath in February 2016. EF at that time was 15%.  A FU echo in August 2016 showed EF had improved to 35-40%.   LHC 12/22/2014 showed 90% proximal to mid RCA lesion that treated with 3.0 x 32 mm Synergy DES. He had residual chronic total occlusion of mid left circumflex with left to left collaterals.  His ACE I was changed to [facility] but unfortunately, his insurance did not cover Entresto and it was too expensive and changed to ACE I.His last echo in 2019 showed normalization of EF at 60%.   He is here today for followup and is doing well.  He denies any chest pain or pressure, SOB, DOE, PND, orthopnea, LE edema, dizziness, palpitations or syncope. He is compliant with his meds and is tolerating meds with no SE.     Prior CV studies:   The following studies were reviewed today:  none  Past Medical History:  Diagnosis Date   Arthritis    "back and left hip" (12/22/2014)   Chronic diastolic (congestive) heart failure (HCC)    Coronary artery disease 01/2015   chronic total occlusion of mid LCx and severely diseased RCA on previous cath in February 2016. EF at that time was 15%. He was cleared by neurosurgery for DAPT and then was seen by Dr. Irish Lack to proceed with PCI of RCA.  LHC 12/22/2014 showed 90% proximal to mid RCA lesion that treated with 3.0 x 32 mm Synergy DES   DCM (dilated cardiomyopathy) ([place]) 06/22/2014   EF was 15% but now 35-40% after PCI. 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   from HTN   Past Surgical History:  Procedure Laterality Date   [place]   right frontal ventriculotomy with subsequent ventriculo-abfdominal shunt due to [place] N/A 12/22/2014   Procedure: Coronary Stent Intervention;  Surgeon: Jettie Booze, MD;  Location: [place] CV LAB;  Service: Cardiovascular;  Laterality: N/A;   CARDIAC CATHETERIZATION  12/22/2014   Procedure: Left Heart Cath and Coronary Angiography;  Surgeon: Jettie Booze, MD;  Location: [place] [facility];  Service: Cardiovascular;;   CARDIAC CATHETERIZATION  06/24/2014   CORONARY ANGIOPLASTY     CSF SHUNT  1998   INGUINAL HERNIA REPAIR Right 1954   LEFT HEART CATHETERIZATION WITH CORONARY ANGIOGRAM N/A 06/24/2014   Procedure: LEFT HEART CATHETERIZATION WITH CORONARY ANGIOGRAM;  Surgeon: Jettie Booze, MD;  Location: [facility] CATH LAB;  Service: Cardiovascular;  Laterality: N/A;     Current Meds  Medication Sig   aspirin 81 MG tablet Take 81 mg by mouth daily.   carvedilol (COREG) 12.5 MG tablet TAKE 1 TABLET BY MOUTH TWICE DAILY   clopidogrel (PLAVIX) 75 MG tablet TAKE 1 TABLET(75 MG) BY MOUTH DAILY   ezetimibe (ZETIA) 10 MG tablet Take 1 tablet (10  mg total) by mouth daily.   FLUAD QUADRIVALENT 0.5 ML injection    furosemide (LASIX) 20 MG tablet Take 1 tablet (20 mg total) by mouth as needed for edema.   isosorbide mononitrate (IMDUR) 30 MG 24 hr tablet TAKE 1 TABLET(30 MG) BY MOUTH DAILY   lisinopril (ZESTRIL) 40 MG tablet TAKE 1 TABLET BY MOUTH EVERY DAY   metFORMIN (GLUCOPHAGE) 1000 MG tablet TAKE 1 TABLET BY MOUTH TWICE DAILY WITH FOOD   Multiple Vitamins-Minerals (MULTIVITAMIN WITH MINERALS) tablet Take 1 tablet by mouth daily.   nitroGLYCERIN (NITROSTAT) 0.4 MG SL tablet Place 1 tablet (0.4 mg total) under the tongue every 5 (five) minutes as needed for chest pain (x 3 tabs).   rosuvastatin (CRESTOR)  40 MG tablet Take 1 tablet (40 mg total) by mouth daily. Please keep upcoming appt in May 2022 before anymore refills. Thank you   spironolactone (ALDACTONE) 25 MG tablet TAKE 1/2 TABLET(12.5 MG) BY MOUTH DAILY   tetrahydrozoline-zinc (VISINE-AC) 0.05-0.25 % ophthalmic solution Place 1 drop into both eyes daily as needed (allergy irritation.).     Allergies:   Patient has no known allergies.   Social History   Tobacco Use   Smoking status: Never   Smokeless tobacco: Never  Substance Use Topics   Alcohol use: Yes    Comment: 12/22/2014 "no alcohol since 1998"   Drug use: No     Family Hx: The patient's family history includes Cancer in his brother, cousin, father, and mother; Diabetes in his father; Hypertension in his father; Pancreatic cancer in his mother; Prostate cancer in his father and paternal uncle.  ROS:   Please see the history of present illness.     All other systems reviewed and are negative.   Labs/Other Tests and Data Reviewed:    Recent Labs: 06/21/2020: ALT 94; BUN 22; Creatinine, Ser 1.65; Hemoglobin 11.5; Platelets 181.0; Potassium 5.3 No hemolysis seen; Sodium 139; TSH 0.85   Recent Lipid Panel Lab Results  Component Value Date/Time   CHOL 70 06/21/2020 02:43 PM   CHOL 103 03/25/2019 10:08 AM   TRIG 76.0 06/21/2020 02:43 PM   HDL 29.30 (L) 06/21/2020 02:43 PM   HDL 37 (L) 03/25/2019 10:08 AM   CHOLHDL 2 06/21/2020 02:43 PM   LDLCALC 25 06/21/2020 02:43 PM   LDLCALC 48 11/29/2019 10:12 AM   LDLDIRECT 215.3 01/03/2011 10:35 AM    Wt Readings from Last 3 Encounters:  09/14/20 177 lb (80.3 kg)  08/03/20 171 lb 3.2 oz (77.7 kg)  06/21/20 179 lb (81.2 kg)     Objective:    Vital Signs:  BP 106/66   Pulse 78   Ht 5\' 8"  (1.727 m)   Wt 177 lb (80.3 kg)   SpO2 98%   BMI 26.91 kg/m   GEN: Well nourished, well developed in no acute distress HEENT: Normal NECK: No JVD; No carotid bruits LYMPHATICS: No lymphadenopathy CARDIAC:RRR, no murmurs, rubs,  gallops RESPIRATORY:  Clear to auscultation without rales, wheezing or rhonchi  ABDOMEN: Soft, non-tender, non-distended MUSCULOSKELETAL:  No edema; No deformity  SKIN: Warm and dry NEUROLOGIC:  Alert and oriented x 3 PSYCHIATRIC:  Normal affect    EKG was performed in the office today and showed NSR with inferior infarct and nonspecific T wave abnormality ASSESSMENT & PLAN:    1.  ASCAD -cath showed chronic total occlusion of mid LCx and severely diseased RCA on cath in February 2016. EF at that time was 15%.   -A FU echo  in August 2016 showed EF had improved to 35-40%.   -LHC 12/22/2014 showed 90% proximal to mid RCA lesion that treated with 3.0 x 32 mm Synergy DES. He had residual chronic total occlusion of mid left circumflex with left to left collaterals.  -he denies any anginal symptoms -Continue prescription drug management with ASA 81mg  daily, Plavix 75mg  daily, carvedilol 12.5mg  BID, Imdur 30mg  daily and Crestor 40mg  daily >>refilled for 1 year  2.  Chronic diastolic  CHF -weight remains stable and he denies any SOB or LE edema -I have personally reviewed and interpreted outside labs performed by patient's PCP which showed SCr 1.65, K+ 5.2 and TSH 0.85 in March 2022  -continue prescription drug management with Lasix 20mg  daily PRN for swelling>>refilled  3.  Ischemic DCM -EF in 2016 was as low as 15% in Feb 2016  -EF improved to 35-40% on f/u echo Aug 2016.   -His most recent echocardiogram April 2019 showed normalization of EF at 60 to 65%.  4.  HTN -BP is adequately controlled on exam today (hydralazine has since been stopped due to soft BP) -Continue prescription drug management with Carvedilol 12.5mg  BID, lisinopril 40mg  daily and spiro 12.5mg  daily >>refilled for 1 year -repeat BMET  5.  HLD -LDL goal < 70 -I have personally reviewed and interpreted outside labs performed by patient's PCP which showed LDL 25, HDL 29, TAGs 76 and ALT 94 in March 2022  -repeat  LFTs -Continue prescription drug management with Crestor 40mg  daily and Zetia 10mg  daily>>refilled for 1 year  Medication Adjustments/Labs and Tests Ordered: Current medicines are reviewed at length with the patient today.  Concerns regarding medicines are outlined above.  Tests Ordered: Orders Placed This Encounter  Procedures   EKG 12-Lead    Medication Changes: No orders of the defined types were placed in this encounter.   Disposition:  Follow up 1 year  Signed, Fransico Him, MD  09/14/2020 10:55 AM    [place]

## 2020-09-14 NOTE — Addendum Note (Signed)
Addended by: Antonieta Iba on: 09/14/2020 11:10 AM   Modules accepted: Orders

## 2020-09-15 ENCOUNTER — Telehealth: Payer: Self-pay | Admitting: Internal Medicine

## 2020-09-15 DIAGNOSIS — N1831 Chronic kidney disease, stage 3a: Secondary | ICD-10-CM

## 2020-09-15 NOTE — Telephone Encounter (Signed)
Ok for referral nephrology  Plesae to let pt know - due to kidneys slowed and cardiology concerned

## 2020-09-15 NOTE — Telephone Encounter (Signed)
Called pt at [phone], unable to leave voicemail due to it being full.

## 2020-09-21 ENCOUNTER — Other Ambulatory Visit: Payer: Self-pay | Admitting: Cardiology

## 2020-09-29 ENCOUNTER — Telehealth: Payer: Self-pay | Admitting: Cardiology

## 2020-09-29 NOTE — Telephone Encounter (Signed)
Letter was sent to patient in regards to his lab results.  [name], MD  09/15/2020 10:37 AM EDT      SCr remains elevated - please find out if patient has a renal MD  The patient has been notified of the result and verbalized understanding.  All questions (if any) were answered. Antonieta Iba, RN 09/29/2020 9:38 AM   Patient states that he does not have a renal MD.

## 2020-09-29 NOTE — Telephone Encounter (Signed)
Left message for patient in regards to following up with PCP to manage his kidney function. Advised to call back with any questions.

## 2020-09-29 NOTE — Telephone Encounter (Signed)
Patient was returning call from letter he received. Please advise

## 2020-10-02 NOTE — Telephone Encounter (Signed)
Attempted to call patient. Unable to leave voicemail.  

## 2020-10-02 NOTE — Telephone Encounter (Signed)
Pt is returning call.  

## 2020-10-06 ENCOUNTER — Telehealth: Payer: Self-pay | Admitting: Cardiology

## 2020-10-06 NOTE — Telephone Encounter (Signed)
Spoke with patient and advised per Dr Radford Pax, he is to f/u with his PCP regarding his kidney function.  Pt states understanding and will contact his PCP.

## 2020-10-06 NOTE — Telephone Encounter (Signed)
PT is calling to find out the nme of the kidney doctor he was told to go to

## 2020-10-17 ENCOUNTER — Other Ambulatory Visit: Payer: Self-pay | Admitting: Internal Medicine

## 2020-10-20 ENCOUNTER — Telehealth: Payer: Self-pay | Admitting: Cardiology

## 2020-10-20 DIAGNOSIS — R7989 Other specified abnormal findings of blood chemistry: Secondary | ICD-10-CM

## 2020-10-20 NOTE — Telephone Encounter (Signed)
Pt is returning a call  

## 2020-10-20 NOTE — Telephone Encounter (Signed)
Per pt does not have a renal MD . SCr remains elevated - please find out if patient has a renal MD

## 2020-10-24 NOTE — Telephone Encounter (Signed)
Urgent referral placed

## 2020-10-27 ENCOUNTER — Telehealth: Payer: Self-pay | Admitting: Cardiology

## 2020-10-27 NOTE — Telephone Encounter (Signed)
Dawn from [facility] is calling in regards to receiving a referral from Dr. Radford Pax, pt already have an urgent referral sent by Dr. Cathlean Cower on 09/19/20. Dawn have already unsuccessfully reached out to the patient. Dawn is the referral coordinator for [facility] and she can be reached at [phone] if anyone needs to speak with her.

## 2020-10-28 ENCOUNTER — Other Ambulatory Visit: Payer: Self-pay | Admitting: Cardiology

## 2020-11-01 NOTE — Telephone Encounter (Signed)
Left message for patient to call back  

## 2020-11-14 ENCOUNTER — Other Ambulatory Visit: Payer: Self-pay | Admitting: Cardiology

## 2020-11-29 ENCOUNTER — Other Ambulatory Visit: Payer: Self-pay | Admitting: Nephrology

## 2020-11-29 DIAGNOSIS — I129 Hypertensive chronic kidney disease with stage 1 through stage 4 chronic kidney disease, or unspecified chronic kidney disease: Secondary | ICD-10-CM

## 2020-11-29 DIAGNOSIS — N1831 Chronic kidney disease, stage 3a: Secondary | ICD-10-CM

## 2020-12-07 ENCOUNTER — Other Ambulatory Visit: Payer: Self-pay

## 2020-12-07 ENCOUNTER — Ambulatory Visit
Admission: RE | Admit: 2020-12-07 | Discharge: 2020-12-07 | Disposition: A | Discharge status: Home / Self Care | Payer: Medicare (Managed Care) | Source: Ambulatory Visit | Attending: Nephrology | Admitting: Nephrology

## 2020-12-07 DIAGNOSIS — I129 Hypertensive chronic kidney disease with stage 1 through stage 4 chronic kidney disease, or unspecified chronic kidney disease: Secondary | ICD-10-CM

## 2020-12-07 DIAGNOSIS — N1831 Chronic kidney disease, stage 3a: Secondary | ICD-10-CM

## 2020-12-25 ENCOUNTER — Ambulatory Visit: Payer: Medicare (Managed Care) | Admitting: Internal Medicine

## 2020-12-27 ENCOUNTER — Other Ambulatory Visit: Payer: Self-pay

## 2020-12-27 ENCOUNTER — Encounter: Payer: Self-pay | Admitting: Internal Medicine

## 2020-12-27 ENCOUNTER — Ambulatory Visit (INDEPENDENT_AMBULATORY_CARE_PROVIDER_SITE_OTHER): Payer: Medicare (Managed Care) | Admitting: Internal Medicine

## 2020-12-27 VITALS — BP 120/72 | HR 76 | Temp 98.1°F | Ht 68.0 in | Wt 174.0 lb

## 2020-12-27 DIAGNOSIS — E1159 Type 2 diabetes mellitus with other circulatory complications: Secondary | ICD-10-CM | POA: Diagnosis not present

## 2020-12-27 DIAGNOSIS — E785 Hyperlipidemia, unspecified: Secondary | ICD-10-CM | POA: Diagnosis not present

## 2020-12-27 DIAGNOSIS — N1831 Chronic kidney disease, stage 3a: Secondary | ICD-10-CM | POA: Diagnosis not present

## 2020-12-27 DIAGNOSIS — I10 Essential (primary) hypertension: Secondary | ICD-10-CM

## 2020-12-27 DIAGNOSIS — Z23 Encounter for immunization: Secondary | ICD-10-CM | POA: Diagnosis not present

## 2020-12-27 LAB — BASIC METABOLIC PANEL
BUN: 22 mg/dL (ref 6–23)
CO2: 25 mEq/L (ref 19–32)
Calcium: 9.5 mg/dL (ref 8.4–10.5)
Chloride: 107 mEq/L (ref 96–112)
Creatinine, Ser: 1.7 mg/dL — ABNORMAL HIGH (ref 0.40–1.50)
GFR: 39.9 mL/min — ABNORMAL LOW (ref >60.00)
Glucose, Bld: 102 mg/dL — ABNORMAL HIGH (ref 70–99)
Potassium: 4.5 mEq/L (ref 3.5–5.1)
Sodium: 140 mEq/L (ref 135–145)

## 2020-12-27 LAB — HEPATIC FUNCTION PANEL
ALT: 30 U/L (ref 0–53)
AST: 34 U/L (ref 0–37)
Albumin: 4.3 g/dL (ref 3.5–5.2)
Alkaline Phosphatase: 30 U/L — ABNORMAL LOW (ref 39–117)
Bilirubin, Direct: 0.2 mg/dL (ref 0.0–0.3)
Total Bilirubin: 1 mg/dL (ref 0.2–1.2)
Total Protein: 7.6 g/dL (ref 6.0–8.3)

## 2020-12-27 LAB — LIPID PANEL
Cholesterol: 93 mg/dL (ref 0–200)
HDL: 32 mg/dL — ABNORMAL LOW (ref >39.00)
LDL Cholesterol: 42 mg/dL (ref 0–99)
NonHDL: 60.82
Total CHOL/HDL Ratio: 3
Triglycerides: 93 mg/dL (ref 0.0–149.0)
VLDL: 18.6 mg/dL (ref 0.0–40.0)

## 2020-12-27 LAB — HEMOGLOBIN A1C: Hgb A1c MFr Bld: 5.3 % (ref 4.6–6.5)

## 2020-12-27 NOTE — Patient Instructions (Addendum)
You had the flu shot today  Please consider the Novavax covid vaccine when it comes out likely in October 2022, since you havent had the infection and I am hoping it will make it so you dont have to do the boosters  Please continue all other medications as before, and refills have been done if requested.  Please have the pharmacy call with any other refills you may need.  Please continue your efforts at being more active, low cholesterol diet, and weight control.  Please keep your appointments with your specialists as you may have planned - Renal as planned (Dr Merita Norton)  Please go to the LAB at the blood drawing area for the tests to be done  You will be contacted by phone if any changes need to be made immediately.  Otherwise, you will receive a letter about your results with an explanation, but please check with MyChart first.  Please remember to sign up for MyChart if you have not done so, as this will be important to you in the future with finding out test results, communicating by private email, and scheduling acute appointments online when needed.  Please make an Appointment to return in 6 months, or sooner if needed

## 2020-12-27 NOTE — Progress Notes (Signed)
Patient ID: Jesse Woods, male   DOB: 1951/03/28, 70 y.o.   MRN: [id number]        Chief Complaint: follow up HTN, HLD and hyperglycemia , ckd       HPI:  Jesse Woods is a 70 y.o. male here overall doing well, Pt denies chest pain, increased sob or doe, wheezing, orthopnea, PND, increased LE swelling, palpitations, dizziness or syncope.   Pt denies polydipsia, polyuria, or new focal neuro s/s.   Pt denies fever, wt loss, night sweats, loss of appetite, or other constitutional symptoms  No new complaints        Wt Readings from Last 3 Encounters:  12/27/20 174 lb (78.9 kg)  09/14/20 177 lb (80.3 kg)  08/03/20 171 lb 3.2 oz (77.7 kg)   BP Readings from Last 3 Encounters:  12/27/20 120/72  09/14/20 106/66  08/03/20 118/80         Past Medical History:  Diagnosis Date   Arthritis    "back and left hip" (12/22/2014)   Chronic diastolic (congestive) heart failure (HCC)    Coronary artery disease 01/2015   chronic total occlusion of mid LCx and severely diseased RCA on previous cath in February 2016. EF at that time was 15%. He was cleared by neurosurgery for DAPT and then was seen by Dr. Irish Lack to proceed with PCI of RCA.  LHC 12/22/2014 showed 90% proximal to mid RCA lesion that treated with 3.0 x 32 mm Synergy DES   DCM (dilated cardiomyopathy) ([place]) 06/22/2014   EF was 15% but now 35-40% after PCI. Echo 2019 with EF 60-65% with G1DD   Diabetes mellitus, type II ([place])    Hyperlipidemia    Hypertension    Subarachnoid hemorrhage ([place]) 1998   from HTN   Past Surgical History:  Procedure Laterality Date   [place]   right frontal ventriculotomy with subsequent ventriculo-abfdominal shunt due to [place] N/A 12/22/2014   Procedure: Coronary Stent Intervention;  Surgeon: Jettie Booze, MD;  Location: [place] [facility];  Service: Cardiovascular;  Laterality: N/A;   CARDIAC CATHETERIZATION  12/22/2014   Procedure: Left Heart Cath and Coronary  Angiography;  Surgeon: Jettie Booze, MD;  Location: [place] [facility];  Service: Cardiovascular;;   CARDIAC CATHETERIZATION  06/24/2014   CORONARY ANGIOPLASTY     CSF SHUNT  1998   INGUINAL HERNIA REPAIR Right 1954   LEFT HEART CATHETERIZATION WITH CORONARY ANGIOGRAM N/A 06/24/2014   Procedure: LEFT HEART CATHETERIZATION WITH CORONARY ANGIOGRAM;  Surgeon: Jettie Booze, MD;  Location: [facility] CATH LAB;  Service: Cardiovascular;  Laterality: N/A;    reports that he has never smoked. He has never used smokeless tobacco. He reports current alcohol use. He reports that he does not use drugs. family history includes Cancer in his brother, cousin, father, and mother; Diabetes in his father; Hypertension in his father; Pancreatic cancer in his mother; Prostate cancer in his father and paternal uncle. No Known Allergies Current Outpatient Medications on File Prior to Visit  Medication Sig Dispense Refill   aspirin 81 MG tablet Take 81 mg by mouth daily.     carvedilol (COREG) 12.5 MG tablet TAKE 1 TABLET BY MOUTH TWICE DAILY 180 tablet 3   clopidogrel (PLAVIX) 75 MG tablet TAKE 1 TABLET(75 MG) BY MOUTH DAILY 90 tablet 2   ezetimibe (ZETIA) 10 MG tablet Take 1 tablet (10 mg total) by mouth daily. 90 tablet 3   FLUAD  QUADRIVALENT 0.5 ML injection      isosorbide mononitrate (IMDUR) 30 MG 24 hr tablet TAKE 1 TABLET(30 MG) BY MOUTH DAILY 90 tablet 3   lisinopril (ZESTRIL) 40 MG tablet Take 1 tablet (40 mg total) by mouth daily. 90 tablet 3   metFORMIN (GLUCOPHAGE) 1000 MG tablet TAKE 1 TABLET BY MOUTH TWICE DAILY WITH FOOD 90 tablet 1   Multiple Vitamins-Minerals (MULTIVITAMIN WITH MINERALS) tablet Take 1 tablet by mouth daily.     nitroGLYCERIN (NITROSTAT) 0.4 MG SL tablet Place 1 tablet (0.4 mg total) under the tongue every 5 (five) minutes as needed for chest pain (x 3 tabs). 60 tablet 3   rosuvastatin (CRESTOR) 40 MG tablet Take 1 tablet (40 mg total) by mouth daily. 90 tablet 3    spironolactone (ALDACTONE) 25 MG tablet TAKE 1/2 TABLET(12.5 MG) BY MOUTH DAILY 45 tablet 3   tetrahydrozoline-zinc (VISINE-AC) 0.05-0.25 % ophthalmic solution Place 1 drop into both eyes daily as needed (allergy irritation.).     furosemide (LASIX) 20 MG tablet Take 1 tablet (20 mg total) by mouth as needed for edema. 90 tablet 3   No current facility-administered medications on file prior to visit.        ROS:  All others reviewed and negative.  Objective        PE:  BP 120/72 (BP Location: Right Arm, Patient Position: Sitting, Cuff Size: Large)   Pulse 76   Temp 98.1 F (36.7 C) (Oral)   Ht 5\' 8"  (1.727 m)   Wt 174 lb (78.9 kg)   SpO2 99%   BMI 26.46 kg/m                 Constitutional: Pt appears in NAD               HENT: Head: NCAT.                Right Ear: External ear normal.                 Left Ear: External ear normal.                Eyes: . Pupils are equal, round, and reactive to light. Conjunctivae and EOM are normal               Nose: without d/c or deformity               Neck: Neck supple. Gross normal ROM               Cardiovascular: Normal rate and regular rhythm.                 Pulmonary/Chest: Effort normal and breath sounds without rales or wheezing.                Abd:  Soft, NT, ND, + BS, no organomegaly               Neurological: Pt is alert. At baseline orientation, motor grossly intact               Skin: Skin is warm. No rashes, no other new lesions, LE edema - none               Psychiatric: Pt behavior is normal without agitation   Micro: none  Cardiac tracings I have personally interpreted today:  none  Pertinent Radiological findings (summarize): none   Lab Results  Component Value Date   WBC 5.4 06/21/2020  HGB 11.5 (L) 06/21/2020   HCT 35.0 (L) 06/21/2020   PLT 181.0 06/21/2020   GLUCOSE 102 (H) 12/27/2020   CHOL 93 12/27/2020   TRIG 93.0 12/27/2020   HDL 32.00 (L) 12/27/2020   LDLDIRECT 215.3 01/03/2011   LDLCALC 42  12/27/2020   ALT 30 12/27/2020   AST 34 12/27/2020   NA 140 12/27/2020   K 4.5 12/27/2020   CL 107 12/27/2020   CREATININE 1.70 (H) 12/27/2020   BUN 22 12/27/2020   CO2 25 12/27/2020   TSH 0.85 06/21/2020   PSA 1.10 06/21/2020   INR 1.1 (H) 12/19/2014   HGBA1C 5.3 12/27/2020   MICROALBUR 1.3 06/21/2020   Assessment/Plan:  SPIKE DESILETS is a 70 y.o. Black or African American [2] male with  has a past medical history of Arthritis, Chronic diastolic (congestive) heart failure ([place]), Coronary artery disease (01/2015), DCM (dilated cardiomyopathy) ([place]) (06/22/2014), Diabetes mellitus, type II ([place]), Hyperlipidemia, Hypertension, and Subarachnoid hemorrhage ([place]) (1998).  Stage 3a chronic kidney disease ([place]) Lab Results  Component Value Date   CREATININE 1.70 (H) 12/27/2020   Stable overall, cont to avoid nephrotoxins, f/u renal as planned  Essential hypertension BP Readings from Last 3 Encounters:  12/27/20 120/72  09/14/20 106/66  08/03/20 118/80   Stable, pt to continue medical treatment aldactone, lisinopril, coreg   Dyslipidemia Lab Results  Component Value Date   LDLCALC 42 12/27/2020   Stable, pt to continue current statin crestor and zetia   Diabetes mellitus ([place]) Lab Results  Component Value Date   HGBA1C 5.3 12/27/2020   Stable, pt to continue current medical treatment metformin  Followup: Return in about 6 months (around 06/26/2021).  Cathlean Cower, MD 12/30/2020 10:21 PM [facility]

## 2020-12-30 ENCOUNTER — Encounter: Payer: Self-pay | Admitting: Internal Medicine

## 2020-12-30 NOTE — Assessment & Plan Note (Signed)
BP Readings from Last 3 Encounters:  12/27/20 120/72  09/14/20 106/66  08/03/20 118/80   Stable, pt to continue medical treatment aldactone, lisinopril, coreg

## 2020-12-30 NOTE — Assessment & Plan Note (Signed)
Lab Results  Component Value Date   LDLCALC 42 12/27/2020   Stable, pt to continue current statin crestor and zetia

## 2020-12-30 NOTE — Assessment & Plan Note (Signed)
Lab Results  Component Value Date   HGBA1C 5.3 12/27/2020   Stable, pt to continue current medical treatment metformin

## 2020-12-30 NOTE — Assessment & Plan Note (Signed)
Lab Results  Component Value Date   CREATININE 1.70 (H) 12/27/2020   Stable overall, cont to avoid nephrotoxins, f/u renal as planned

## 2021-01-22 ENCOUNTER — Other Ambulatory Visit: Payer: Self-pay | Admitting: Internal Medicine

## 2021-01-22 NOTE — Telephone Encounter (Signed)
Please refill as per office routine med refill policy (all routine meds to be refilled for 3 mo or monthly (per pt preference) up to one year from last visit, then month to month grace period for 3 mo, then further med refills will have to be denied) ? ?

## 2021-05-01 ENCOUNTER — Other Ambulatory Visit: Payer: Self-pay | Admitting: Internal Medicine

## 2021-05-01 NOTE — Telephone Encounter (Signed)
Please refill as per office routine med refill policy (all routine meds to be refilled for 3 mo or monthly (per pt preference) up to one year from last visit, then month to month grace period for 3 mo, then further med refills will have to be denied) ? ?

## 2021-06-15 DIAGNOSIS — N1831 Chronic kidney disease, stage 3a: Secondary | ICD-10-CM | POA: Diagnosis not present

## 2021-06-15 DIAGNOSIS — N2581 Secondary hyperparathyroidism of renal origin: Secondary | ICD-10-CM | POA: Diagnosis not present

## 2021-06-15 DIAGNOSIS — I129 Hypertensive chronic kidney disease with stage 1 through stage 4 chronic kidney disease, or unspecified chronic kidney disease: Secondary | ICD-10-CM | POA: Diagnosis not present

## 2021-06-15 DIAGNOSIS — E875 Hyperkalemia: Secondary | ICD-10-CM | POA: Diagnosis not present

## 2021-06-15 DIAGNOSIS — D631 Anemia in chronic kidney disease: Secondary | ICD-10-CM | POA: Diagnosis not present

## 2021-06-25 ENCOUNTER — Encounter: Payer: Self-pay | Admitting: Gastroenterology

## 2021-06-26 ENCOUNTER — Ambulatory Visit: Payer: TRICARE For Life (TFL) | Admitting: Internal Medicine

## 2021-07-04 ENCOUNTER — Encounter: Payer: Self-pay | Admitting: Internal Medicine

## 2021-07-04 ENCOUNTER — Ambulatory Visit (INDEPENDENT_AMBULATORY_CARE_PROVIDER_SITE_OTHER): Payer: Medicare HMO | Admitting: Internal Medicine

## 2021-07-04 VITALS — BP 130/72 | HR 74 | Temp 98.8°F | Ht 68.0 in | Wt 186.2 lb

## 2021-07-04 DIAGNOSIS — E538 Deficiency of other specified B group vitamins: Secondary | ICD-10-CM

## 2021-07-04 DIAGNOSIS — Z1211 Encounter for screening for malignant neoplasm of colon: Secondary | ICD-10-CM | POA: Diagnosis not present

## 2021-07-04 DIAGNOSIS — E1159 Type 2 diabetes mellitus with other circulatory complications: Secondary | ICD-10-CM

## 2021-07-04 DIAGNOSIS — E785 Hyperlipidemia, unspecified: Secondary | ICD-10-CM

## 2021-07-04 DIAGNOSIS — N1831 Chronic kidney disease, stage 3a: Secondary | ICD-10-CM

## 2021-07-04 DIAGNOSIS — H538 Other visual disturbances: Secondary | ICD-10-CM

## 2021-07-04 DIAGNOSIS — Z0001 Encounter for general adult medical examination with abnormal findings: Secondary | ICD-10-CM | POA: Diagnosis not present

## 2021-07-04 DIAGNOSIS — E559 Vitamin D deficiency, unspecified: Secondary | ICD-10-CM | POA: Diagnosis not present

## 2021-07-04 DIAGNOSIS — I10 Essential (primary) hypertension: Secondary | ICD-10-CM

## 2021-07-04 LAB — CBC WITH DIFFERENTIAL/PLATELET
Basophils Absolute: 0 10*3/uL (ref 0.0–0.1)
Basophils Relative: 0.5 % (ref 0.0–3.0)
Eosinophils Absolute: 0.1 10*3/uL (ref 0.0–0.7)
Eosinophils Relative: 2 % (ref 0.0–5.0)
HCT: 36 % — ABNORMAL LOW (ref 39.0–52.0)
Hemoglobin: 11.7 g/dL — ABNORMAL LOW (ref 13.0–17.0)
Lymphocytes Relative: 27.9 % (ref 12.0–46.0)
Lymphs Abs: 1.7 10*3/uL (ref 0.7–4.0)
MCHC: 32.4 g/dL (ref 30.0–36.0)
MCV: 87.7 fl (ref 78.0–100.0)
Monocytes Absolute: 0.4 10*3/uL (ref 0.1–1.0)
Monocytes Relative: 7 % (ref 3.0–12.0)
Neutro Abs: 3.8 10*3/uL (ref 1.4–7.7)
Neutrophils Relative %: 62.6 % (ref 43.0–77.0)
Platelets: 213 10*3/uL (ref 150.0–400.0)
RBC: 4.11 Mil/uL — ABNORMAL LOW (ref 4.22–5.81)
RDW: 13.4 % (ref 11.5–15.5)
WBC: 6.1 10*3/uL (ref 4.0–10.5)

## 2021-07-04 LAB — MICROALBUMIN / CREATININE URINE RATIO
Creatinine,U: 323 mg/dL
Microalb Creat Ratio: 1.6 mg/g (ref 0.0–30.0)
Microalb, Ur: 5.1 mg/dL — ABNORMAL HIGH (ref 0.0–1.9)

## 2021-07-04 LAB — LIPID PANEL
Cholesterol: 89 mg/dL (ref 0–200)
HDL: 30 mg/dL — ABNORMAL LOW (ref >39.00)
LDL Cholesterol: 38 mg/dL (ref 0–99)
NonHDL: 58.83
Total CHOL/HDL Ratio: 3
Triglycerides: 103 mg/dL (ref 0.0–149.0)
VLDL: 20.6 mg/dL (ref 0.0–40.0)

## 2021-07-04 LAB — BASIC METABOLIC PANEL
BUN: 18 mg/dL (ref 6–23)
CO2: 26 mEq/L (ref 19–32)
Calcium: 9.3 mg/dL (ref 8.4–10.5)
Chloride: 107 mEq/L (ref 96–112)
Creatinine, Ser: 1.75 mg/dL — ABNORMAL HIGH (ref 0.40–1.50)
GFR: 38.4 mL/min — ABNORMAL LOW (ref >60.00)
Glucose, Bld: 113 mg/dL — ABNORMAL HIGH (ref 70–99)
Potassium: 4.8 mEq/L (ref 3.5–5.1)
Sodium: 140 mEq/L (ref 135–145)

## 2021-07-04 LAB — HEMOGLOBIN A1C: Hgb A1c MFr Bld: 6.1 % (ref 4.6–6.5)

## 2021-07-04 LAB — URINALYSIS, ROUTINE W REFLEX MICROSCOPIC
Bilirubin Urine: NEGATIVE
Hgb urine dipstick: NEGATIVE
Leukocytes,Ua: NEGATIVE
Nitrite: NEGATIVE
Specific Gravity, Urine: 1.025 (ref 1.000–1.030)
Urine Glucose: NEGATIVE
Urobilinogen, UA: 1 (ref 0.0–1.0)
pH: 6 (ref 5.0–8.0)

## 2021-07-04 LAB — VITAMIN D 25 HYDROXY (VIT D DEFICIENCY, FRACTURES): VITD: 41.67 ng/mL (ref 30.00–100.00)

## 2021-07-04 LAB — PSA: PSA: 1.45 ng/mL (ref 0.10–4.00)

## 2021-07-04 LAB — HEPATIC FUNCTION PANEL
ALT: 37 U/L (ref 0–53)
AST: 35 U/L (ref 0–37)
Albumin: 4.4 g/dL (ref 3.5–5.2)
Alkaline Phosphatase: 34 U/L — ABNORMAL LOW (ref 39–117)
Bilirubin, Direct: 0.2 mg/dL (ref 0.0–0.3)
Total Bilirubin: 0.9 mg/dL (ref 0.2–1.2)
Total Protein: 7.2 g/dL (ref 6.0–8.3)

## 2021-07-04 LAB — TSH: TSH: 1.05 u[IU]/mL (ref 0.35–5.50)

## 2021-07-04 LAB — VITAMIN B12: Vitamin B-12: 371 pg/mL (ref 211–911)

## 2021-07-04 NOTE — Assessment & Plan Note (Signed)
Age and sex appropriate education and counseling updated with regular exercise and diet ?Referrals for preventative services - for colonoscopy and optho exam referral ?Immunizations addressed - none needed ?Smoking counseling  - none needed ?Evidence for depression or other mood disorder - none significant ?Most recent labs reviewed. ?I have personally reviewed and have noted: ?1) the patient's medical and social history ?2) The patient's current medications and supplements ?3) The patient's height, weight, and BMI have been recorded in the chart ? ?

## 2021-07-04 NOTE — Patient Instructions (Addendum)
You will be contacted regarding the referral for: colonoscopy, and eye doctor ? ?Please continue all other medications as before, and refills have been done if requested. ? ?Please have the pharmacy call with any other refills you may need. ? ?Please continue your efforts at being more active, low cholesterol diet, and weight control. ? ?You are otherwise up to date with prevention measures today. ? ?Please keep your appointments with your specialists as you may have planned ? ?Please go to the LAB at the blood drawing area for the tests to be done ? ?You will be contacted by phone if any changes need to be made immediately.  Otherwise, you will receive a letter about your results with an explanation, but please check with MyChart first. ? ?Please remember to sign up for MyChart if you have not done so, as this will be important to you in the future with finding out test results, communicating by private email, and scheduling acute appointments online when needed. ? ?Please make an Appointment to return in 6 months, or sooner if needed ?

## 2021-07-04 NOTE — Assessment & Plan Note (Signed)
Lab Results  ?Component Value Date  ? HGBA1C 6.1 07/04/2021  ? ?Stable, pt to continue current medical treatment metformin ? ?

## 2021-07-04 NOTE — Progress Notes (Signed)
Patient ID: Jesse Woods, male   DOB: 26-Jun-1950, 71 y.o.   MRN: [id number] ? ? ?     Chief Complaint:: wellness exam and Follow-up (6 month f/u) ? Hyperglyemia, ckd, htn ? ?     HPI:  Jesse Woods is a 71 y.o. male here for wellness exam; due for colonoscopy, optho exam, o/w up to date ?         ?              Also overall o/w doing well Pt denies chest pain, increased sob or doe, wheezing, orthopnea, PND, increased LE swelling, palpitations, dizziness or syncope.   Pt denies polydipsia, polyuria, or new focal neuro s/s.    Pt denies fever, wt loss, night sweats, loss of appetite, or other constitutional symptoms  no other new complaints ?  ?Wt Readings from Last 3 Encounters:  ?07/04/21 186 lb 3.2 oz (84.5 kg)  ?12/27/20 174 lb (78.9 kg)  ?09/14/20 177 lb (80.3 kg)  ? ?BP Readings from Last 3 Encounters:  ?07/04/21 130/72  ?12/27/20 120/72  ?09/14/20 106/66  ? ?Immunization History  ?Administered Date(s) Administered  ? Fluad Quad(high Dose 65+) 12/08/2018, 12/27/2020  ? Influenza Split 12/02/2012  ? Influenza Whole 01/12/2007, 01/06/2012  ? Influenza, Seasonal, Injecte, Preservative Fre 11/22/2013  ? Influenza-Unspecified 11/28/2015, 11/06/2016, 12/08/2018, 01/03/2020  ? PFIZER([place])SARS-COV-2 Vaccination 06/10/2019, 07/06/2019, 01/03/2020  ? Pneumococcal Conjugate-13 01/07/2017  ? Pneumococcal Polysaccharide-23 04/28/2012, 12/23/2014  ? Td 11/17/2009  ? Tdap 06/21/2020  ? Zoster Recombinat (Shingrix) 06/25/2021  ? ?Health Maintenance Due  ?Topic Date Due  ? OPHTHALMOLOGY EXAM  05/18/2021  ? COLONOSCOPY (Pts 45-11yr Insurance coverage will need to be confirmed)  06/06/2021  ? ?  ? ?Past Medical History:  ?Diagnosis Date  ? Arthritis   ? "back and left hip" (12/22/2014)  ? Chronic diastolic (congestive) heart failure (HCC)   ? Coronary artery disease 01/2015  ? chronic total occlusion of mid LCx and severely diseased RCA on previous cath in February 2016. EF at that time was 15%. He was cleared by  neurosurgery for DAPT and then was seen by Dr. VIrish Lackto proceed with PCI of RCA.  LHC 12/22/2014 showed 90% proximal to mid RCA lesion that treated with 3.0 x 32 mm Synergy DES  ? [place] 06/[place]  ? EF was 15% but now 35-40% after PCI. Echo 2019 with EF 60-65% with G1DD  ? Diabetes mellitus, type II ([place]   ? Hyperlipidemia   ? Hypertension   ? [place] ([place]  ? from HTN  ? ?Past Surgical History:  ?Procedure Laterality Date  ? BRAIN SURGERY  1998  ? [facility] ? CARDIAC CATHETERIZATION N/A 12/22/2014  ? Procedure: Coronary Stent Intervention;  Surgeon: JJettie Booze MD;  [facility];  Service: Cardiovascular;  Laterality: N/A;  ? CARDIAC CATHETERIZATION  12/22/2014  ? Procedure: Left Heart Cath and Coronary Angiography;  Surgeon: JJettie Booze MD;  [facility];  Service: Cardiovascular;;  ? CARDIAC CATHETERIZATION  06/24/2014  ? CORONARY ANGIOPLASTY    ? CSF SHUNT  1998  ? INGUINAL HERNIA REPAIR Right 1954  ? LEFT HEART CATHETERIZATION WITH CORONARY ANGIOGRAM N/A 06/24/2014  ? Procedure: LEFT HEART CATHETERIZATION WITH CORONARY ANGIOGRAM;  Surgeon: JJettie Booze MD;  [facility];  Service: Cardiovascular;  Laterality: N/A;  ? ? reports that he has never  smoked. He has never used smokeless tobacco. He reports current alcohol use. He reports that he does not use drugs. ?family history includes Cancer in his brother, cousin, father, and mother; Diabetes in his father; Hypertension in his father; Pancreatic cancer in his mother; Prostate cancer in his father and paternal uncle. ?No Known Allergies ?Current Outpatient Medications on File Prior to Visit  ?Medication Sig Dispense Refill  ? aspirin 81 MG tablet Take 81 mg by mouth daily.    ? carvedilol (COREG) 12.5 MG tablet TAKE 1 TABLET BY MOUTH TWICE DAILY 180 tablet 3  ? clopidogrel  (PLAVIX) 75 MG tablet TAKE 1 TABLET(75 MG) BY MOUTH DAILY 90 tablet 2  ? ezetimibe (ZETIA) 10 MG tablet Take 1 tablet (10 mg total) by mouth daily. 90 tablet 3  ? FARXIGA 10 MG TABS tablet Take 10 mg by mouth daily.    ? isosorbide mononitrate (IMDUR) 30 MG 24 hr tablet TAKE 1 TABLET(30 MG) BY MOUTH DAILY 90 tablet 3  ? lisinopril (ZESTRIL) 40 MG tablet Take 1 tablet (40 mg total) by mouth daily. 90 tablet 3  ? metFORMIN (GLUCOPHAGE) 1000 MG tablet TAKE 1 TABLET BY MOUTH TWICE DAILY WITH FOOD 90 tablet 1  ? Multiple Vitamins-Minerals (MULTIVITAMIN WITH MINERALS) tablet Take 1 tablet by mouth daily.    ? nitroGLYCERIN (NITROSTAT) 0.4 MG SL tablet Place 1 tablet (0.4 mg total) under the tongue every 5 (five) minutes as needed for chest pain (x 3 tabs). 60 tablet 3  ? rosuvastatin (CRESTOR) 40 MG tablet Take 1 tablet (40 mg total) by mouth daily. 90 tablet 3  ? spironolactone (ALDACTONE) 25 MG tablet TAKE 1/2 TABLET(12.5 MG) BY MOUTH DAILY 45 tablet 3  ? tetrahydrozoline-zinc (VISINE-AC) 0.05-0.25 % ophthalmic solution Place 1 drop into both eyes daily as needed (allergy irritation.).    ? furosemide (LASIX) 20 MG tablet Take 1 tablet (20 mg total) by mouth as needed for edema. 90 tablet 3  ? ?No current facility-administered medications on file prior to visit.  ? ?     ROS:  All others reviewed and negative. ? ?Objective  ? ?     PE:  BP 130/72 (BP Location: Right Arm, Patient Position: Sitting, Cuff Size: Large)   Pulse 74   Temp 98.8 ?F (37.1 ?C) (Oral)   Ht '5\' 8"'$  (1.727 m)   Wt 186 lb 3.2 oz (84.5 kg)   SpO2 98%   BMI 28.31 kg/m?  ? ?              Constitutional: Pt appears in NAD ?              HENT: Head: NCAT.  ?              Right Ear: External ear normal.   ?              Left Ear: External ear normal.  ?              Eyes: . Pupils are equal, round, and reactive to light. Conjunctivae and EOM are normal ?              Nose: without d/c or deformity ?              Neck: Neck supple. Gross normal ROM ?               Cardiovascular: Normal rate and regular rhythm.   ?  Pulmonary/Chest: Effort normal and breath sounds without rales or wheezing.  ?              Abd:  Soft, NT, ND, + BS, no organomegaly ?              Neurological: Pt is alert. At baseline orientation, motor grossly intact ?              Skin: Skin is warm. No rashes, no other new lesions, LE edema - none ?              Psychiatric: Pt behavior is normal without agitation  ? ?Micro: none ? ?Cardiac tracings I have personally interpreted today:  none ? ?Pertinent Radiological findings (summarize): none  ? ?Lab Results  ?Component Value Date  ? WBC 6.1 07/04/2021  ? HGB 11.7 (L) 07/04/2021  ? HCT 36.0 (L) 07/04/2021  ? PLT 213.0 07/04/2021  ? GLUCOSE 113 (H) 07/04/2021  ? CHOL 89 07/04/2021  ? TRIG 103.0 07/04/2021  ? HDL 30.00 (L) 07/04/2021  ? LDLDIRECT 215.3 01/03/2011  ? LDLCALC 38 07/04/2021  ? ALT 37 07/04/2021  ? AST 35 07/04/2021  ? NA 140 07/04/2021  ? K 4.8 07/04/2021  ? CL 107 07/04/2021  ? CREATININE 1.75 (H) 07/04/2021  ? BUN 18 07/04/2021  ? CO2 26 07/04/2021  ? TSH 1.05 07/04/2021  ? PSA 1.45 07/04/2021  ? INR 1.1 (H) 12/19/2014  ? HGBA1C 6.1 07/04/2021  ? MICROALBUR 5.1 (H) 07/04/2021  ? ?Assessment/Plan:  ?DELROY ORDWAY is a 71 y.o. Black or African American [2] male with  has a past medical history of Arthritis, Chronic diastolic (congestive) heart failure ([place]), Coronary artery disease (01/2015), DCM (dilated cardiomyopathy) ([place]) (06/22/2014), Diabetes mellitus, type II ([place]), Hyperlipidemia, Hypertension, and Subarachnoid hemorrhage ([place]) (1998). ? ?Encounter for well adult exam with abnormal findings ?Age and sex appropriate education and counseling updated with regular exercise and diet ?Referrals for preventative services - for colonoscopy and optho exam referral ?Immunizations addressed - none needed ?Smoking counseling  - none needed ?Evidence for depression or other mood disorder - none significant ?Most recent labs  reviewed. ?I have personally reviewed and have noted: ?1) the patient's medical and social history ?2) The patient's current medications and supplements ?3) The patient's height, weight, and BMI have been reco

## 2021-07-04 NOTE — Assessment & Plan Note (Signed)
Lab Results  ?Component Value Date  ? CREATININE 1.75 (H) 07/04/2021  ? ?Stable overall, cont to avoid nephrotoxins ? ?

## 2021-07-04 NOTE — Assessment & Plan Note (Signed)
Lab Results  ?Component Value Date  ? LDLCALC 38 07/04/2021  ? ?Stable, pt to continue current zetia ? ?

## 2021-07-04 NOTE — Assessment & Plan Note (Signed)
BP Readings from Last 3 Encounters:  ?07/04/21 130/72  ?12/27/20 120/72  ?09/14/20 106/66  ? ?Stable, pt to continue medical treatment coreg ? ?

## 2021-08-02 ENCOUNTER — Other Ambulatory Visit: Payer: Self-pay | Admitting: Internal Medicine

## 2021-08-02 NOTE — Telephone Encounter (Signed)
Please refill as per office routine med refill policy (all routine meds to be refilled for 3 mo or monthly (per pt preference) up to one year from last visit, then month to month grace period for 3 mo, then further med refills will have to be denied) ? ?

## 2021-08-06 ENCOUNTER — Ambulatory Visit: Payer: Self-pay

## 2021-08-06 ENCOUNTER — Telehealth: Payer: Self-pay | Admitting: Internal Medicine

## 2021-08-06 NOTE — Telephone Encounter (Signed)
LM informing patient that we needed to reschedule his appointment today at 1:00 for his AWV due to [facility] being out.  ?Advised patient to call the office back to reschedule. PJR ?

## 2021-09-04 ENCOUNTER — Other Ambulatory Visit: Payer: Self-pay | Admitting: Cardiology

## 2021-09-13 DIAGNOSIS — H25812 Combined forms of age-related cataract, left eye: Secondary | ICD-10-CM | POA: Diagnosis not present

## 2021-09-13 DIAGNOSIS — E119 Type 2 diabetes mellitus without complications: Secondary | ICD-10-CM | POA: Diagnosis not present

## 2021-09-13 DIAGNOSIS — H401131 Primary open-angle glaucoma, bilateral, mild stage: Secondary | ICD-10-CM | POA: Diagnosis not present

## 2021-09-24 ENCOUNTER — Other Ambulatory Visit: Payer: Self-pay | Admitting: Cardiology

## 2021-10-01 ENCOUNTER — Other Ambulatory Visit: Payer: Self-pay | Admitting: Cardiology

## 2021-10-02 ENCOUNTER — Other Ambulatory Visit: Payer: Self-pay | Admitting: *Deleted

## 2021-10-18 ENCOUNTER — Other Ambulatory Visit: Payer: Self-pay | Admitting: *Deleted

## 2021-10-18 MED ORDER — LISINOPRIL 40 MG PO TABS: 40.0000 mg | ORAL_TABLET | Freq: Every day | ORAL | 0 refills | Status: DC

## 2021-10-22 ENCOUNTER — Encounter: Payer: Self-pay | Admitting: Cardiology

## 2021-10-22 ENCOUNTER — Ambulatory Visit (INDEPENDENT_AMBULATORY_CARE_PROVIDER_SITE_OTHER): Payer: Medicare HMO | Admitting: Cardiology

## 2021-10-22 ENCOUNTER — Other Ambulatory Visit: Payer: Self-pay | Admitting: Cardiology

## 2021-10-22 VITALS — BP 100/60 | HR 71 | Ht 68.0 in | Wt 178.6 lb

## 2021-10-22 DIAGNOSIS — E785 Hyperlipidemia, unspecified: Secondary | ICD-10-CM | POA: Diagnosis not present

## 2021-10-22 DIAGNOSIS — I10 Essential (primary) hypertension: Secondary | ICD-10-CM | POA: Diagnosis not present

## 2021-10-22 DIAGNOSIS — I5032 Chronic diastolic (congestive) heart failure: Secondary | ICD-10-CM

## 2021-10-22 DIAGNOSIS — I255 Ischemic cardiomyopathy: Secondary | ICD-10-CM

## 2021-10-22 DIAGNOSIS — I2510 Atherosclerotic heart disease of native coronary artery without angina pectoris: Secondary | ICD-10-CM | POA: Diagnosis not present

## 2021-10-22 NOTE — Progress Notes (Signed)
Date:  10/22/2021   ID:  Jesse Woods, DOB April 29, 1950, MRN [id number]  PCP:  Biagio Borg, MD  Cardiologist:  Fransico Him, MD  Electrophysiologist:  None   Chief Complaint:  CAD, HTN, HLD, CHF  History of Present Illness:    Jesse Woods is a 71 y.o. male  with a hx of HTN, HL, DM2, remote history of subarachnoid hemorrhage, CAD and ischemic cardiomyopathy. He was noted to have chronic total occlusion of mid LCx and severely diseased RCA on previous cath in February 2016. EF at that time was 15%.  A FU echo in August 2016 showed EF had improved to 35-40%.   LHC 12/22/2014 showed 90% proximal to mid RCA lesion that treated with 3.0 x 32 mm Synergy DES. He had residual chronic total occlusion of mid left circumflex with left to left collaterals.  His ACE I was changed to [facility] but unfortunately, his insurance did not cover Entresto and it was too expensive and changed to ACE I.His last echo in 2019 showed normalization of EF at 60%.   He is here today for followup and is doing well.  He denies any chest pain or pressure, SOB, DOE, PND, orthopnea, LE edema, dizziness, palpitations or syncope.  He is compliant with his meds and is tolerating meds with no SE.    Prior CV studies:   The following studies were reviewed today:  none  Past Medical History:  Diagnosis Date   Arthritis    "back and left hip" (12/22/2014)   Chronic diastolic (congestive) heart failure (HCC)    Coronary artery disease 01/2015   chronic total occlusion of mid LCx and severely diseased RCA on previous cath in February 2016. EF at that time was 15%. He was cleared by neurosurgery for DAPT and then was seen by Dr. Irish Lack to proceed with PCI of RCA.  LHC 12/22/2014 showed 90% proximal to mid RCA lesion that treated with 3.0 x 32 mm Synergy DES   DCM (dilated cardiomyopathy) ([place]) 06/22/2014   EF was 15% but now 35-40% after PCI. Echo 2019 with EF 60-65% with G1DD   Diabetes mellitus, type II ([place])     Hyperlipidemia    Hypertension    Subarachnoid hemorrhage ([place]) 1998   from HTN   Past Surgical History:  Procedure Laterality Date   [place]   right frontal ventriculotomy with subsequent ventriculo-abfdominal shunt due to [place] N/A 12/22/2014   Procedure: Coronary Stent Intervention;  Surgeon: Jettie Booze, MD;  Location: [place] CV LAB;  Service: Cardiovascular;  Laterality: N/A;   CARDIAC CATHETERIZATION  12/22/2014   Procedure: Left Heart Cath and Coronary Angiography;  Surgeon: Jettie Booze, MD;  Location: [place] CV LAB;  Service: Cardiovascular;;   CARDIAC CATHETERIZATION  06/24/2014   CORONARY ANGIOPLASTY     CSF SHUNT  1998   INGUINAL HERNIA REPAIR Right 1954   LEFT HEART CATHETERIZATION WITH CORONARY ANGIOGRAM N/A 06/24/2014   Procedure: LEFT HEART CATHETERIZATION WITH CORONARY ANGIOGRAM;  Surgeon: Jettie Booze, MD;  Location: [facility] CATH LAB;  Service: Cardiovascular;  Laterality: N/A;     No outpatient medications have been marked as taking for the 10/22/21 encounter (Office Visit) with [name], MD.     Allergies:   Patient has no known allergies.   Social History   Tobacco Use   Smoking status: Never   Smokeless tobacco: Never  Substance Use Topics   Alcohol  use: Yes    Comment: 12/22/2014 "no alcohol since 1998"   Drug use: No     Family Hx: The patient's family history includes Cancer in his brother, cousin, father, and mother; Diabetes in his father; Hypertension in his father; Pancreatic cancer in his mother; Prostate cancer in his father and paternal uncle.  ROS:   Please see the history of present illness.     All other systems reviewed and are negative.   Labs/Other Tests and Data Reviewed:    Recent Labs: 07/04/2021: ALT 37; BUN 18; Creatinine, Ser 1.75; Hemoglobin 11.7; Platelets 213.0; Potassium 4.8; Sodium 140; TSH 1.05   Recent Lipid Panel Lab Results  Component Value Date/Time    CHOL 89 07/04/2021 11:49 AM   CHOL 103 03/25/2019 10:08 AM   TRIG 103.0 07/04/2021 11:49 AM   HDL 30.00 (L) 07/04/2021 11:49 AM   HDL 37 (L) 03/25/2019 10:08 AM   CHOLHDL 3 07/04/2021 11:49 AM   LDLCALC 38 07/04/2021 11:49 AM   LDLCALC 48 11/29/2019 10:12 AM   LDLDIRECT 215.3 01/03/2011 10:35 AM    Wt Readings from Last 3 Encounters:  07/04/21 186 lb 3.2 oz (84.5 kg)  12/27/20 174 lb (78.9 kg)  09/14/20 177 lb (80.3 kg)     Objective:    Vital Signs:  There were no vitals taken for this visit.  GEN: Well nourished, well developed in no acute distress HEENT: Normal NECK: No JVD; No carotid bruits LYMPHATICS: No lymphadenopathy CARDIAC:RRR, no murmurs, rubs, gallops RESPIRATORY:  Clear to auscultation without rales, wheezing or rhonchi  ABDOMEN: Soft, non-tender, non-distended MUSCULOSKELETAL:  No edema; No deformity  SKIN: Warm and dry NEUROLOGIC:  Alert and oriented x 3 PSYCHIATRIC:  Normal affect    EKG was performed in the office today and showed NSR with inferior infarct and lateral T wave abnormality  ASSESSMENT & PLAN:    1.  ASCAD -cath showed chronic total occlusion of mid LCx and severely diseased RCA on cath in February 2016. EF at that time was 15%.   -FU echo in August 2016 showed EF had improved to 35-40%.   -LHC 12/22/2014 showed 90% proximal to mid RCA lesion that treated with 3.0 x 32 mm Synergy DES. He had residual chronic total occlusion of mid left circumflex with left to left collaterals.  -He has not had any anginal symptoms since I saw him last -Continue prescription drug management with aspirin 81 mg daily, Plavix 75 mg daily, carvedilol 12.5 mg twice daily, Imdur 30 mg daily, Crestor 40 mg daily with as needed refills  2.  Chronic diastolic  CHF -He denies any shortness of breath or lower extremity edema -He appears euvolemic on exam today -continue prescription drug management Lasix 20 mg daily as needed for swelling with as needed refills  3.   Ischemic DCM -EF in 2016 was as low as 15% in Feb 2016  -EF improved to 35-40% on f/u echo Aug 2016.   -His most recent echocardiogram April 2019 showed normalization of EF at 60 to 65%.  4.  HTN -BP is adequately controlled on exam today  -Continue prescription drug management with carvedilol 12.5 mg twice daily, lisinopril 40 mg daily and spironolactone 12.5 mg daily with as needed refills -I have personally reviewed and interpreted outside labs performed by patient's PCP which showed serum creatinine 1.75, potassium 4.8 on 07/04/2021  5.  HLD -LDL goal < 70 -I have personally reviewed and interpreted outside labs performed by patient's PCP which  showed LDL 38, HDL 30, triglycerides 103 on 07/04/2021 -Continue prescription drug management with Crestor 40 mg daily and Zetia 10 mg daily with as needed refills  Medication Adjustments/Labs and Tests Ordered: Current medicines are reviewed at length with the patient today.  Concerns regarding medicines are outlined above.  Tests Ordered: No orders of the defined types were placed in this encounter.   Medication Changes: No orders of the defined types were placed in this encounter.    Disposition:  Follow up 1 year  Signed, Fransico Him, MD  10/22/2021 10:19 AM    [place]

## 2021-10-22 NOTE — Patient Instructions (Signed)

## 2021-10-31 ENCOUNTER — Other Ambulatory Visit: Payer: Self-pay | Admitting: Cardiology

## 2021-10-31 DIAGNOSIS — I2510 Atherosclerotic heart disease of native coronary artery without angina pectoris: Secondary | ICD-10-CM

## 2021-11-06 DIAGNOSIS — H25812 Combined forms of age-related cataract, left eye: Secondary | ICD-10-CM | POA: Diagnosis not present

## 2021-11-12 ENCOUNTER — Other Ambulatory Visit: Payer: Self-pay | Admitting: Cardiology

## 2021-11-13 ENCOUNTER — Other Ambulatory Visit: Payer: Self-pay

## 2021-11-13 MED ORDER — LISINOPRIL 40 MG PO TABS: 40.0000 mg | ORAL_TABLET | Freq: Every day | ORAL | 3 refills | Status: DC

## 2021-11-13 MED ORDER — ISOSORBIDE MONONITRATE ER 30 MG PO TB24: ORAL_TABLET | ORAL | 3 refills | Status: DC

## 2021-11-13 NOTE — Telephone Encounter (Signed)
Pt's medication was sent to pt's pharmacy as requested. Confirmation received.  °

## 2021-11-14 DIAGNOSIS — H2511 Age-related nuclear cataract, right eye: Secondary | ICD-10-CM | POA: Diagnosis not present

## 2021-11-16 ENCOUNTER — Other Ambulatory Visit: Payer: Self-pay | Admitting: Cardiology

## 2021-11-20 DIAGNOSIS — H25811 Combined forms of age-related cataract, right eye: Secondary | ICD-10-CM | POA: Diagnosis not present

## 2021-11-21 ENCOUNTER — Ambulatory Visit: Payer: Medicare HMO | Admitting: Cardiology

## 2021-11-22 ENCOUNTER — Other Ambulatory Visit: Payer: Self-pay | Admitting: Cardiology

## 2021-11-26 ENCOUNTER — Ambulatory Visit (INDEPENDENT_AMBULATORY_CARE_PROVIDER_SITE_OTHER): Payer: Medicare HMO

## 2021-11-26 DIAGNOSIS — Z0000 Encounter for general adult medical examination without abnormal findings: Secondary | ICD-10-CM

## 2021-11-26 NOTE — Progress Notes (Signed)
I connected with Jesse Woods today by telephone and verified that I am speaking with the correct person using two identifiers. Location patient: home Location provider: work Persons participating in the virtual visit: patient, provider.   I discussed the limitations, risks, security and privacy concerns of performing an evaluation and management service by telephone and the availability of in person appointments. I also discussed with the patient that there may be a patient responsible charge related to this service. The patient expressed understanding and verbally consented to this telephonic visit.    Interactive audio and video telecommunications were attempted between this provider and patient, however failed, due to patient having technical difficulties OR patient did not have access to video capability.  We continued and completed visit with audio only.  Some vital signs may be absent or patient reported.   Time Spent with patient on telephone encounter: 30 minutes  Subjective:   Jesse Woods is a 71 y.o. male who presents for Medicare Annual/Subsequent preventive examination.  Review of Systems     Cardiac Risk Factors include: advanced age (>50mn, >>47women);diabetes mellitus;dyslipidemia;family history of premature cardiovascular disease;male gender;hypertension     Objective:    There were no vitals filed for this visit. There is no height or weight on file to calculate BMI.     11/26/2021    3:36 PM 08/03/2020    1:04 PM 06/06/2016    8:14 AM 11/29/2015   10:24 AM 12/22/2014    7:23 AM 06/24/2014    9:17 AM  Advanced Directives  Does Patient Have a Medical Advance Directive? No No No No No No  Would patient like information on creating a medical advance directive? No - Patient declined No - Patient declined  Yes - Educational materials given No - patient declined information No - patient declined information    Current Medications (verified) Outpatient Encounter  Medications as of 11/26/2021  Medication Sig   aspirin 81 MG tablet Take 81 mg by mouth daily.   carvedilol (COREG) 12.5 MG tablet TAKE 1 TABLET BY MOUTH TWICE DAILY   clopidogrel (PLAVIX) 75 MG tablet TAKE 1 TABLET(75 MG) BY MOUTH DAILY   ezetimibe (ZETIA) 10 MG tablet TAKE 1 TABLET(10 MG) BY MOUTH DAILY   FARXIGA 10 MG TABS tablet Take 10 mg by mouth daily.   furosemide (LASIX) 20 MG tablet Take 1 tablet (20 mg total) by mouth as needed for edema.   isosorbide mononitrate (IMDUR) 30 MG 24 hr tablet TAKE 1 TABLET(30 MG) BY MOUTH DAILY. .Marland Kitchen  lisinopril (ZESTRIL) 40 MG tablet Take 1 tablet (40 mg total) by mouth daily.   metFORMIN (GLUCOPHAGE) 1000 MG tablet TAKE 1 TABLET BY MOUTH TWICE DAILY WITH FOOD   Multiple Vitamins-Minerals (MULTIVITAMIN WITH MINERALS) tablet Take 1 tablet by mouth daily.   nitroGLYCERIN (NITROSTAT) 0.4 MG SL tablet Place 1 tablet (0.4 mg total) under the tongue every 5 (five) minutes as needed for chest pain (x 3 tabs).   rosuvastatin (CRESTOR) 40 MG tablet TAKE 1 TABLET(40 MG) BY MOUTH DAILY   spironolactone (ALDACTONE) 25 MG tablet TAKE 1/2 TABLET(12.5 MG) BY MOUTH DAILY   tetrahydrozoline-zinc (VISINE-AC) 0.05-0.25 % ophthalmic solution Place 1 drop into both eyes daily as needed (allergy irritation.).   No facility-administered encounter medications on file as of 11/26/2021.    Allergies (verified) Patient has no known allergies.   History: Past Medical History:  Diagnosis Date   Arthritis    "back and left hip" (12/22/2014)   Chronic  diastolic (congestive) heart failure (HCC)    Coronary artery disease 01/2015   chronic total occlusion of mid LCx and severely diseased RCA on previous cath in February 2016. EF at that time was 15%. He was cleared by neurosurgery for DAPT and then was seen by Dr. Irish Lack to proceed with PCI of RCA.  LHC 12/22/2014 showed 90% proximal to mid RCA lesion that treated with 3.0 x 32 mm Synergy DES   DCM (dilated cardiomyopathy) ([place])  06/22/2014   EF was 15% but now 35-40% after PCI. Echo 2019 with EF 60-65% with G1DD   Diabetes mellitus, type II ([place])    Hyperlipidemia    Hypertension    Subarachnoid hemorrhage ([place]) 1998   from HTN   Past Surgical History:  Procedure Laterality Date   [place]   right frontal ventriculotomy with subsequent ventriculo-abfdominal shunt due to [place] N/A 12/22/2014   Procedure: Coronary Stent Intervention;  Surgeon: Jettie Booze, MD;  Location: [place] CV LAB;  Service: Cardiovascular;  Laterality: N/A;   CARDIAC CATHETERIZATION  12/22/2014   Procedure: Left Heart Cath and Coronary Angiography;  Surgeon: Jettie Booze, MD;  Location: [place] CV LAB;  Service: Cardiovascular;;   CARDIAC CATHETERIZATION  06/24/2014   CORONARY ANGIOPLASTY     CSF SHUNT  1998   INGUINAL HERNIA REPAIR Right 1954   LEFT HEART CATHETERIZATION WITH CORONARY ANGIOGRAM N/A 06/24/2014   Procedure: LEFT HEART CATHETERIZATION WITH CORONARY ANGIOGRAM;  Surgeon: Jettie Booze, MD;  Location: [facility] CATH LAB;  Service: Cardiovascular;  Laterality: N/A;   Family History  Problem Relation Age of Onset   Cancer Mother        pancreatic   Pancreatic cancer Mother    Diabetes Father    Hypertension Father    Cancer Father        prostate cancer   Prostate cancer Father    Cancer Brother        prostate cancer - cause of death   Cancer Cousin        prostate cancer   Prostate cancer Paternal Uncle    Social History   Socioeconomic History   Marital status: Divorced    Spouse name: Not on file   Number of children: 2   Years of education: Not on file   Highest education level: Not on file  Occupational History   Occupation: retired  Tobacco Use   Smoking status: Never   Smokeless tobacco: Never  Substance and Sexual Activity   Alcohol use: Yes    Comment: 12/22/2014 "no alcohol since 1998"   Drug use: No   Sexual activity: Not Currently     Partners: Female  Other Topics Concern   Not on file  Social History Narrative   HSG, [employer]. Married- '72-'86; remarried '86-'90, single. 1 son- '73, 1 daughter-'79; 2 grandchildren. Retired after [employer]	[place], worked for [employer] before retirement. Had served in [employer] force. He did live in [place]. Lives together with his son. Very active in his church   Social Determinants of Health   Financial Resource Strain: Low Risk  (11/26/2021)   Overall Financial Resource Strain (CARDIA)    Difficulty of Paying Living Expenses: Not hard at all  Food Insecurity: No Food Insecurity (11/26/2021)   Hunger Vital Sign    Worried About Running Out of Food in the Last Year: Never true    Ran Out of Food in the  Last Year: Never true  Transportation Needs: No Transportation Needs (11/26/2021)   PRAPARE - Hydrologist (Medical): No    Lack of Transportation (Non-Medical): No  Physical Activity: Sufficiently Active (11/26/2021)   Exercise Vital Sign    Days of Exercise per Week: 5 days    Minutes of Exercise per Session: 30 min  Stress: No Stress Concern Present (11/26/2021)   [place]    Feeling of Stress : Not at all  Social Connections: Moderately Integrated (11/26/2021)   Social Connection and Isolation Panel [NHANES]    Frequency of Communication with Friends and Family: More than three times a week    Frequency of Social Gatherings with Friends and Family: More than three times a week    Attends Religious Services: More than 4 times per year    Active Member of [employer] or Organizations: Not on file    Attends Music therapist: More than 4 times per year    Marital Status: Divorced    Tobacco Counseling Counseling given: Not Answered   Clinical Intake:  Pre-visit preparation completed: Yes  Pain : No/denies pain     Nutritional  Risks: None Diabetes: Yes CBG done?: No Did pt. bring in CBG monitor from home?: No  How often do you need to have someone help you when you read instructions, pamphlets, or other written materials from your doctor or pharmacy?: 1 - Never What is the last grade level you completed in school?: Bachelor's Degree  Diabetic? yes  Interpreter Needed?: No  Information entered by :: Lisette Abu, LPN.   Activities of Daily Living    11/26/2021    3:46 PM  In your present state of health, do you have any difficulty performing the following activities:  Hearing? 0  Vision? 0  Difficulty concentrating or making decisions? 0  Walking or climbing stairs? 0  Dressing or bathing? 0  Doing errands, shopping? 0  Preparing Food and eating ? N  Using the Toilet? N  In the past six months, have you accidently leaked urine? N  Do you have problems with loss of bowel control? N  Managing your Medications? N  Managing your Finances? N  Housekeeping or managing your Housekeeping? N    Patient Care Team: Biagio Borg, MD as PCP - General (Internal Medicine) [name], MD as PCP - Cardiology (Cardiology) Clent Jacks, MD (Ophthalmology)  Indicate any recent Medical Services you may have received from other than [facility] providers in the past year (date may be approximate).     Assessment:   This is a routine wellness examination for [place].  Hearing/Vision screen Hearing Screening - Comments:: Patient denied any hearing difficulty.   No hearing aids.  Vision Screening - Comments:: Patient does wear readers.  Eye exam done by: [place] issues and exercise activities discussed: Current Exercise Habits: Home exercise routine, Type of exercise: walking, Time (Minutes): 30, Frequency (Times/Week): 5, Weekly Exercise (Minutes/Week): 150, Intensity: Moderate   Goals Addressed   None   Depression Screen    11/26/2021    3:38 PM 07/04/2021   11:27 AM 07/04/2021    10:52 AM 12/27/2020    9:46 AM 06/21/2020    2:16 PM 06/21/2020    1:16 PM 06/03/2019    2:38 PM  PHQ 2/9 Scores  PHQ - 2 Score 0 0 0 0 0 0 0    Fall  Risk    11/26/2021    3:38 PM 07/04/2021   11:27 AM 07/04/2021   10:51 AM 12/27/2020    9:46 AM 08/03/2020    1:08 PM  Fall Risk   Falls in the past year? 0 0 0 0 0  Number falls in past yr: 0 0 0 0 0  Injury with Fall? 0 0 0 0 0  Risk for fall due to : No Fall Risks    No Fall Risks  Follow up Falls evaluation completed    Falls evaluation completed    [place]:  Any stairs in or around the home? No  If so, are there any without handrails? No  Home free of loose throw rugs in walkways, pet beds, electrical cords, etc? Yes  Adequate lighting in your home to reduce risk of falls? Yes   ASSISTIVE DEVICES UTILIZED TO PREVENT FALLS:  Life alert? No  Use of a cane, walker or w/c? No  Grab bars in the bathroom? No  Shower chair or bench in shower? No  Elevated toilet seat or a handicapped toilet? No   TIMED UP AND GO:  Was the test performed? No .  Length of time to ambulate 10 feet: n/a sec.   Appearance of gait: Gait not evaluated during this visit.  Cognitive Function:        11/26/2021    3:46 PM  6CIT Screen  What Year? 0 points  What month? 0 points  What time? 0 points  Count back from 20 0 points  Months in reverse 0 points  Repeat phrase 0 points  Total Score 0 points    Immunizations Immunization History  Administered Date(s) Administered   Fluad Quad(high Dose 65+) 12/08/2018, 12/27/2020   Influenza Split 12/02/2012   Influenza Whole 01/12/2007, 01/06/2012   Influenza, Seasonal, Injecte, Preservative Fre 11/22/2013   Influenza-Unspecified 11/28/2015, 11/06/2016, 12/08/2018, 01/03/2020   PFIZER(Purple Top)SARS-COV-2 Vaccination 06/10/2019, 07/06/2019, 01/03/2020   PNEUMOCOCCAL CONJUGATE-20 08/17/2021   Pneumococcal Conjugate-13 01/07/2017   Pneumococcal  Polysaccharide-23 04/28/2012, 12/23/2014   Td 11/17/2009   Tdap 06/21/2020   Zoster Recombinat (Shingrix) 06/25/2021, 08/20/2021    TDAP status: Up to date  Flu Vaccine status: Up to date  Pneumococcal vaccine status: Up to date  Covid-19 vaccine status: Completed vaccines  Qualifies for Shingles Vaccine? Yes   Zostavax completed No   Shingrix Completed?: Yes  Screening Tests Health Maintenance  Topic Date Due   COVID-19 Vaccine (4 - Pfizer series) 02/28/2020   OPHTHALMOLOGY EXAM  05/18/2021   COLONOSCOPY (Pts 45-10yr Insurance coverage will need to be confirmed)  06/06/2021   INFLUENZA VACCINE  11/06/2021   HEMOGLOBIN A1C  01/04/2022   Diabetic kidney evaluation - GFR measurement  07/05/2022   Diabetic kidney evaluation - Urine ACR  07/05/2022   FOOT EXAM  07/05/2022   TETANUS/TDAP  06/22/2030   Pneumonia Vaccine 71 Years old  Completed   Hepatitis C Screening  Completed   Zoster Vaccines- Shingrix  Completed   HPV VACCINES  Aged Out    Health Maintenance  Health Maintenance Due  Topic Date Due   COVID-19 Vaccine (4 - Pfizer series) 02/28/2020   OPHTHALMOLOGY EXAM  05/18/2021   COLONOSCOPY (Pts 45-447yrInsurance coverage will need to be confirmed)  06/06/2021   INFLUENZA VACCINE  11/06/2021    Colorectal cancer screening: Type of screening: Colonoscopy. Completed 06/06/2016. Repeat every 5 years  Lung Cancer Screening: (Low Dose CT Chest recommended if Age  55-80 years, 30 pack-year currently smoking OR have quit w/in 15years.) does not qualify.   Lung Cancer Screening Referral: no  Additional Screening:  Hepatitis C Screening: does qualify; Completed 03/20/2016  Vision Screening: Recommended annual ophthalmology exams for early detection of glaucoma and other disorders of the eye. Is the patient up to date with their annual eye exam?  Yes  Who is the provider or what is the name of the office in which the patient attends annual eye exams? [facility] If  pt is not established with a provider, would they like to be referred to a provider to establish care? No .   Dental Screening: Recommended annual dental exams for proper oral hygiene  Community Resource Referral / Chronic Care Management: CRR required this visit?  No   CCM required this visit?  No      Plan:     I have personally reviewed and noted the following in the patient's chart:   Medical and social history Use of alcohol, tobacco or illicit drugs  Current medications and supplements including opioid prescriptions. Patient is not currently taking opioid prescriptions. Functional ability and status Nutritional status Physical activity Advanced directives List of other physicians Hospitalizations, surgeries, and ER visits in previous 12 months Vitals Screenings to include cognitive, depression, and falls Referrals and appointments  In addition, I have reviewed and discussed with patient certain preventive protocols, quality metrics, and best practice recommendations. A written personalized care plan for preventive services as well as general preventive health recommendations were provided to patient.     Sheral Flow, LPN   [id number]/56/[id number]   Nurse Notes:  Patient is cogitatively intact. There were no vitals filed for this visit. There is no height or weight on file to calculate BMI.

## 2021-11-26 NOTE — Patient Instructions (Signed)
[name] , Thank you for taking time to come for your Medicare Wellness Visit. I appreciate your ongoing commitment to your health goals. Please review the following plan we discussed and let me know if I can assist you in the future.   Screening recommendations/referrals: Colonoscopy: 06/06/2016; due every 5 years Recommended yearly ophthalmology/optometry visit for glaucoma screening and checkup Recommended yearly dental visit for hygiene and checkup  Vaccinations: Influenza vaccine: 9//21/2022 Pneumococcal vaccine: 12/23/2014, 01/07/2017, 08/17/2021 Tdap vaccine: 06/21/2020; due every 10 years Shingles vaccine: 06/25/2021, 08/20/2021   Covid-19: 06/10/2019, 07/06/2019  Advanced directives: No; Advance directive discussed with you today. I have provided a copy for you to complete at home and have notarized. Once this is complete please bring a copy in to our office so we can scan it into your chart.  Conditions/risks identified: Yes  Next appointment: Please schedule your next Medicare Wellness Visit with your Nurse Health Advisor in 1 year by calling [phone].  Preventive Care 54 Years and Older, Male Preventive care refers to lifestyle choices and visits with your health care provider that can promote health and wellness. What does preventive care include? A yearly physical exam. This is also called an annual well check. Dental exams once or twice a year. Routine eye exams. Ask your health care provider how often you should have your eyes checked. Personal lifestyle choices, including: Daily care of your teeth and gums. Regular physical activity. Eating a healthy diet. Avoiding tobacco and drug use. Limiting alcohol use. Practicing safe sex. Taking low doses of aspirin every day. Taking vitamin and mineral supplements as recommended by your health care provider. What happens during an annual well check? The services and screenings done by your health care provider during your  annual well check will depend on your age, overall health, lifestyle risk factors, and family history of disease. Counseling  Your health care provider may ask you questions about your: Alcohol use. Tobacco use. Drug use. Emotional well-being. Home and relationship well-being. Sexual activity. Eating habits. History of falls. Memory and ability to understand (cognition). Work and work Statistician. Screening  You may have the following tests or measurements: Height, weight, and BMI. Blood pressure. Lipid and cholesterol levels. These may be checked every 5 years, or more frequently if you are over 11 years old. Skin check. Lung cancer screening. You may have this screening every year starting at age 18 if you have a 30-pack-year history of smoking and currently smoke or have quit within the past 15 years. Fecal occult blood test (FOBT) of the stool. You may have this test every year starting at age 26. Flexible sigmoidoscopy or colonoscopy. You may have a sigmoidoscopy every 5 years or a colonoscopy every 10 years starting at age 49. Prostate cancer screening. Recommendations will vary depending on your family history and other risks. Hepatitis C blood test. Hepatitis B blood test. Sexually transmitted disease (STD) testing. Diabetes screening. This is done by checking your blood sugar (glucose) after you have not eaten for a while (fasting). You may have this done every 1-3 years. Abdominal aortic aneurysm (AAA) screening. You may need this if you are a current or former smoker. Osteoporosis. You may be screened starting at age 68 if you are at high risk. Talk with your health care provider about your test results, treatment options, and if necessary, the need for more tests. Vaccines  Your health care provider may recommend certain vaccines, such as: Influenza vaccine. This is recommended every year. Tetanus, diphtheria,  and acellular pertussis (Tdap, Td) vaccine. You may need a Td  booster every 10 years. Zoster vaccine. You may need this after age 76. Pneumococcal 13-valent conjugate (PCV13) vaccine. One dose is recommended after age 30. Pneumococcal polysaccharide (PPSV23) vaccine. One dose is recommended after age 30. Talk to your health care provider about which screenings and vaccines you need and how often you need them. This information is not intended to replace advice given to you by your health care provider. Make sure you discuss any questions you have with your health care provider. Document Released: 04/21/2015 Document Revised: 12/13/2015 Document Reviewed: 01/24/2015 Elsevier Interactive Patient Education  2017 [place] Prevention in the Home Falls can cause injuries. They can happen to people of all ages. There are many things you can do to make your home safe and to help prevent falls. What can I do on the outside of my home? Regularly fix the edges of walkways and driveways and fix any cracks. Remove anything that might make you trip as you walk through a door, such as a raised step or threshold. Trim any bushes or trees on the path to your home. Use bright outdoor lighting. Clear any walking paths of anything that might make someone trip, such as rocks or tools. Regularly check to see if handrails are loose or broken. Make sure that both sides of any steps have handrails. Any raised decks and porches should have guardrails on the edges. Have any leaves, snow, or ice cleared regularly. Use sand or salt on walking paths during winter. Clean up any spills in your garage right away. This includes oil or grease spills. What can I do in the bathroom? Use night lights. Install grab bars by the toilet and in the tub and shower. Do not use towel bars as grab bars. Use non-skid mats or decals in the tub or shower. If you need to sit down in the shower, use a plastic, non-slip stool. Keep the floor dry. Clean up any water that spills on the floor  as soon as it happens. Remove soap buildup in the tub or shower regularly. Attach bath mats securely with double-sided non-slip rug tape. Do not have throw rugs and other things on the floor that can make you trip. What can I do in the bedroom? Use night lights. Make sure that you have a light by your bed that is easy to reach. Do not use any sheets or blankets that are too big for your bed. They should not hang down onto the floor. Have a firm chair that has side arms. You can use this for support while you get dressed. Do not have throw rugs and other things on the floor that can make you trip. What can I do in the kitchen? Clean up any spills right away. Avoid walking on wet floors. Keep items that you use a lot in easy-to-reach places. If you need to reach something above you, use a strong step stool that has a grab bar. Keep electrical cords out of the way. Do not use floor polish or wax that makes floors slippery. If you must use wax, use non-skid floor wax. Do not have throw rugs and other things on the floor that can make you trip. What can I do with my stairs? Do not leave any items on the stairs. Make sure that there are handrails on both sides of the stairs and use them. Fix handrails that are broken or loose. Make sure  that handrails are as long as the stairways. Check any carpeting to make sure that it is firmly attached to the stairs. Fix any carpet that is loose or worn. Avoid having throw rugs at the top or bottom of the stairs. If you do have throw rugs, attach them to the floor with carpet tape. Make sure that you have a light switch at the top of the stairs and the bottom of the stairs. If you do not have them, ask someone to add them for you. What else can I do to help prevent falls? Wear shoes that: Do not have high heels. Have rubber bottoms. Are comfortable and fit you well. Are closed at the toe. Do not wear sandals. If you use a stepladder: Make sure that it is  fully opened. Do not climb a closed stepladder. Make sure that both sides of the stepladder are locked into place. Ask someone to hold it for you, if possible. Clearly mark and make sure that you can see: Any grab bars or handrails. First and last steps. Where the edge of each step is. Use tools that help you move around (mobility aids) if they are needed. These include: Canes. Walkers. Scooters. Crutches. Turn on the lights when you go into a dark area. Replace any light bulbs as soon as they burn out. Set up your furniture so you have a clear path. Avoid moving your furniture around. If any of your floors are uneven, fix them. If there are any pets around you, be aware of where they are. Review your medicines with your doctor. Some medicines can make you feel dizzy. This can increase your chance of falling. Ask your doctor what other things that you can do to help prevent falls. This information is not intended to replace advice given to you by your health care provider. Make sure you discuss any questions you have with your health care provider. Document Released: 01/19/2009 Document Revised: 08/31/2015 Document Reviewed: 04/29/2014 Elsevier Interactive Patient Education  2017 [employer].

## 2021-12-11 ENCOUNTER — Other Ambulatory Visit: Payer: Self-pay | Admitting: Cardiology

## 2021-12-18 DIAGNOSIS — H524 Presbyopia: Secondary | ICD-10-CM | POA: Diagnosis not present

## 2022-01-04 ENCOUNTER — Encounter: Payer: Self-pay | Admitting: Internal Medicine

## 2022-01-04 ENCOUNTER — Ambulatory Visit (INDEPENDENT_AMBULATORY_CARE_PROVIDER_SITE_OTHER): Payer: Medicare HMO | Admitting: Internal Medicine

## 2022-01-04 VITALS — BP 118/70 | HR 73 | Ht 68.0 in | Wt 177.0 lb

## 2022-01-04 DIAGNOSIS — N1831 Chronic kidney disease, stage 3a: Secondary | ICD-10-CM

## 2022-01-04 DIAGNOSIS — I10 Essential (primary) hypertension: Secondary | ICD-10-CM

## 2022-01-04 DIAGNOSIS — E785 Hyperlipidemia, unspecified: Secondary | ICD-10-CM | POA: Diagnosis not present

## 2022-01-04 DIAGNOSIS — E1159 Type 2 diabetes mellitus with other circulatory complications: Secondary | ICD-10-CM

## 2022-01-04 DIAGNOSIS — E559 Vitamin D deficiency, unspecified: Secondary | ICD-10-CM | POA: Diagnosis not present

## 2022-01-04 LAB — LIPID PANEL
Cholesterol: 101 mg/dL (ref 0–200)
HDL: 35.5 mg/dL — ABNORMAL LOW (ref >39.00)
LDL Cholesterol: 40 mg/dL (ref 0–99)
NonHDL: 65.48
Total CHOL/HDL Ratio: 3
Triglycerides: 128 mg/dL (ref 0.0–149.0)
VLDL: 25.6 mg/dL (ref 0.0–40.0)

## 2022-01-04 LAB — VITAMIN D 25 HYDROXY (VIT D DEFICIENCY, FRACTURES): VITD: 44.09 ng/mL (ref 30.00–100.00)

## 2022-01-04 LAB — HEPATIC FUNCTION PANEL
ALT: 51 U/L (ref 0–53)
AST: 49 U/L — ABNORMAL HIGH (ref 0–37)
Albumin: 4.3 g/dL (ref 3.5–5.2)
Alkaline Phosphatase: 38 U/L — ABNORMAL LOW (ref 39–117)
Bilirubin, Direct: 0.2 mg/dL (ref 0.0–0.3)
Total Bilirubin: 0.9 mg/dL (ref 0.2–1.2)
Total Protein: 7.6 g/dL (ref 6.0–8.3)

## 2022-01-04 LAB — BASIC METABOLIC PANEL
BUN: 16 mg/dL (ref 6–23)
CO2: 26 mEq/L (ref 19–32)
Calcium: 9.5 mg/dL (ref 8.4–10.5)
Chloride: 107 mEq/L (ref 96–112)
Creatinine, Ser: 1.85 mg/dL — ABNORMAL HIGH (ref 0.40–1.50)
GFR: 35.79 mL/min — ABNORMAL LOW (ref >60.00)
Glucose, Bld: 105 mg/dL — ABNORMAL HIGH (ref 70–99)
Potassium: 5.1 mEq/L (ref 3.5–5.1)
Sodium: 140 mEq/L (ref 135–145)

## 2022-01-04 LAB — HEMOGLOBIN A1C: Hgb A1c MFr Bld: 5.7 % (ref 4.6–6.5)

## 2022-01-04 MED ORDER — METFORMIN HCL 1000 MG PO TABS: 1000.0000 mg | ORAL_TABLET | Freq: Two times a day (BID) | ORAL | 3 refills | Status: DC

## 2022-01-04 NOTE — Assessment & Plan Note (Signed)
BP Readings from Last 3 Encounters:  01/04/22 118/70  10/22/21 100/60  07/04/21 130/72   Stable, pt to continue medical treatment coreg 12.5 bid, lisinopril 40 qd

## 2022-01-04 NOTE — Assessment & Plan Note (Signed)
Lab Results  Component Value Date   HGBA1C 6.1 07/04/2021   Stable, pt to continue current medical treatment farxiga 10 qd, metformin 1000 bid and f/u lab today

## 2022-01-04 NOTE — Progress Notes (Signed)
Patient ID: Jesse Woods, male   DOB: 17-Nov-1950, 71 y.o.   MRN: [id number]        Chief Complaint: follow up HTN, HLD and hyperglycemia        HPI:  Jesse Woods is a 71 y.o. male here overall doing well, Pt denies chest pain, increased sob or doe, wheezing, orthopnea, PND, increased LE swelling, palpitations, dizziness or syncope.   Pt denies polydipsia, polyuria, or new focal neuro s/s.    Pt denies fever, wt loss, night sweats, loss of appetite, or other constitutional symptoms  No other new complaints       Wt Readings from Last 3 Encounters:  01/04/22 177 lb (80.3 kg)  10/22/21 178 lb 9.6 oz (81 kg)  07/04/21 186 lb 3.2 oz (84.5 kg)   BP Readings from Last 3 Encounters:  01/04/22 118/70  10/22/21 100/60  07/04/21 130/72         Past Medical History:  Diagnosis Date   Arthritis    "back and left hip" (12/22/2014)   Chronic diastolic (congestive) heart failure (HCC)    Coronary artery disease 01/2015   chronic total occlusion of mid LCx and severely diseased RCA on previous cath in February 2016. EF at that time was 15%. He was cleared by neurosurgery for DAPT and then was seen by Dr. Irish Lack to proceed with PCI of RCA.  LHC 12/22/2014 showed 90% proximal to mid RCA lesion that treated with 3.0 x 32 mm Synergy DES   DCM (dilated cardiomyopathy) ([place]) 06/22/2014   EF was 15% but now 35-40% after PCI. Echo 2019 with EF 60-65% with G1DD   Diabetes mellitus, type II ([place])    Hyperlipidemia    Hypertension    Subarachnoid hemorrhage ([place]) 1998   from HTN   Past Surgical History:  Procedure Laterality Date   [place]   right frontal ventriculotomy with subsequent ventriculo-abfdominal shunt due to [place] N/A 12/22/2014   Procedure: Coronary Stent Intervention;  Surgeon: Jettie Booze, MD;  Location: [facility];  Service: Cardiovascular;  Laterality: N/A;   CARDIAC CATHETERIZATION  12/22/2014   Procedure: Left Heart Cath and  Coronary Angiography;  Surgeon: Jettie Booze, MD;  Location: [facility];  Service: Cardiovascular;;   CARDIAC CATHETERIZATION  06/24/2014   CORONARY ANGIOPLASTY     CSF SHUNT  1998   INGUINAL HERNIA REPAIR Right 1954   LEFT HEART CATHETERIZATION WITH CORONARY ANGIOGRAM N/A 06/24/2014   Procedure: LEFT HEART CATHETERIZATION WITH CORONARY ANGIOGRAM;  Surgeon: Jettie Booze, MD;  Location: [facility];  Service: Cardiovascular;  Laterality: N/A;    reports that he has never smoked. He has never used smokeless tobacco. He reports current alcohol use. He reports that he does not use drugs. family history includes Cancer in his brother, cousin, father, and mother; Diabetes in his father; Hypertension in his father; Pancreatic cancer in his mother; Prostate cancer in his father and paternal uncle. No Known Allergies Current Outpatient Medications on File Prior to Visit  Medication Sig Dispense Refill   aspirin 81 MG tablet Take 81 mg by mouth daily.     carvedilol (COREG) 12.5 MG tablet TAKE 1 TABLET BY MOUTH TWICE DAILY 180 tablet 3   clopidogrel (PLAVIX) 75 MG tablet TAKE 1 TABLET(75 MG) BY MOUTH DAILY 90 tablet 0   ezetimibe (ZETIA) 10 MG tablet TAKE 1 TABLET(10 MG) BY MOUTH DAILY 90 tablet 3   FARXIGA  10 MG TABS tablet Take 10 mg by mouth daily.     isosorbide mononitrate (IMDUR) 30 MG 24 hr tablet TAKE 1 TABLET(30 MG) BY MOUTH DAILY. . 90 tablet 3   lisinopril (ZESTRIL) 40 MG tablet Take 1 tablet (40 mg total) by mouth daily. 90 tablet 3   Multiple Vitamins-Minerals (MULTIVITAMIN WITH MINERALS) tablet Take 1 tablet by mouth daily.     nitroGLYCERIN (NITROSTAT) 0.4 MG SL tablet Place 1 tablet (0.4 mg total) under the tongue every 5 (five) minutes as needed for chest pain (x 3 tabs). 60 tablet 3   rosuvastatin (CRESTOR) 40 MG tablet TAKE 1 TABLET(40 MG) BY MOUTH DAILY 90 tablet 3   spironolactone (ALDACTONE) 25 MG tablet TAKE 1/2 TABLET(12.5 MG) BY MOUTH DAILY 45 tablet 3    tetrahydrozoline-zinc (VISINE-AC) 0.05-0.25 % ophthalmic solution Place 1 drop into both eyes daily as needed (allergy irritation.).     furosemide (LASIX) 20 MG tablet Take 1 tablet (20 mg total) by mouth as needed for edema. 90 tablet 3   No current facility-administered medications on file prior to visit.        ROS:  All others reviewed and negative.  Objective        PE:  BP 118/70 (BP Location: Left Arm, Patient Position: Sitting, Cuff Size: Large)   Pulse 73   Ht '5\' 8"'$  (1.727 m)   Wt 177 lb (80.3 kg)   SpO2 98%   BMI 26.91 kg/m                 Constitutional: Pt appears in NAD               HENT: Head: NCAT.                Right Ear: External ear normal.                 Left Ear: External ear normal.                Eyes: . Pupils are equal, round, and reactive to light. Conjunctivae and EOM are normal               Nose: without d/c or deformity               Neck: Neck supple. Gross normal ROM               Cardiovascular: Normal rate and regular rhythm.                 Pulmonary/Chest: Effort normal and breath sounds without rales or wheezing.                Abd:  Soft, NT, ND, + BS, no organomegaly               Neurological: Pt is alert. At baseline orientation, motor grossly intact               Skin: Skin is warm. No rashes, no other new lesions, LE edema - none              Psychiatric: Pt behavior is normal without agitation   Micro: none  Cardiac tracings I have personally interpreted today:  none  Pertinent Radiological findings (summarize): none   Lab Results  Component Value Date   WBC 6.1 07/04/2021   HGB 11.7 (L) 07/04/2021   HCT 36.0 (L) 07/04/2021   PLT 213.0 07/04/2021   GLUCOSE 113 (H) 07/04/2021  CHOL 89 07/04/2021   TRIG 103.0 07/04/2021   HDL 30.00 (L) 07/04/2021   LDLDIRECT 215.3 01/03/2011   LDLCALC 38 07/04/2021   ALT 37 07/04/2021   AST 35 07/04/2021   NA 140 07/04/2021   K 4.8 07/04/2021   CL 107 07/04/2021   CREATININE 1.75 (H)  07/04/2021   BUN 18 07/04/2021   CO2 26 07/04/2021   TSH 1.05 07/04/2021   PSA 1.45 07/04/2021   INR 1.1 (H) 12/19/2014   HGBA1C 6.1 07/04/2021   MICROALBUR 5.1 (H) 07/04/2021   Assessment/Plan:  Jesse Woods is a 71 y.o. Black or African American [2] male with  has a past medical history of Arthritis, Chronic diastolic (congestive) heart failure ([place]), Coronary artery disease (01/2015), DCM (dilated cardiomyopathy) ([place]) (06/22/2014), Diabetes mellitus, type II ([place]), Hyperlipidemia, Hypertension, and Subarachnoid hemorrhage ([place]) (1998).  Stage 3a chronic kidney disease ([place]) Lab Results  Component Value Date   CREATININE 1.75 (H) 07/04/2021   Stable overall, cont to avoid nephrotoxins, for f/u lab today  Essential hypertension BP Readings from Last 3 Encounters:  01/04/22 118/70  10/22/21 100/60  07/04/21 130/72   Stable, pt to continue medical treatment coreg 12.5 bid, lisinopril 40 qd    Dyslipidemia Lab Results  Component Value Date   LDLCALC 38 07/04/2021   Stable, pt to continue current statin zetia 10 qd, crestor 40 qd   Diabetes mellitus ([place]) Lab Results  Component Value Date   HGBA1C 6.1 07/04/2021   Stable, pt to continue current medical treatment farxiga 10 qd, metformin 1000 bid and f/u lab today  Followup: Return in about 6 months (around 07/05/2022).  Cathlean Cower, MD 01/04/2022 12:58 PM [facility]

## 2022-01-04 NOTE — Patient Instructions (Signed)

## 2022-01-04 NOTE — Assessment & Plan Note (Signed)
Lab Results  Component Value Date   LDLCALC 38 07/04/2021   Stable, pt to continue current statin zetia 10 qd, crestor 40 qd

## 2022-01-04 NOTE — Assessment & Plan Note (Signed)
Lab Results  Component Value Date   CREATININE 1.75 (H) 07/04/2021   Stable overall, cont to avoid nephrotoxins, for f/u lab today

## 2022-01-07 ENCOUNTER — Other Ambulatory Visit: Payer: Self-pay

## 2022-01-07 MED ORDER — CLOPIDOGREL BISULFATE 75 MG PO TABS: 75.0000 mg | ORAL_TABLET | Freq: Every day | ORAL | 2 refills | Status: DC

## 2022-01-07 NOTE — Telephone Encounter (Signed)
Pt's medication was sent to pt's pharmacy as requested. Confirmation received.  °

## 2022-01-15 DIAGNOSIS — I509 Heart failure, unspecified: Secondary | ICD-10-CM | POA: Diagnosis not present

## 2022-01-15 DIAGNOSIS — I130 Hypertensive heart and chronic kidney disease with heart failure and stage 1 through stage 4 chronic kidney disease, or unspecified chronic kidney disease: Secondary | ICD-10-CM | POA: Diagnosis not present

## 2022-01-15 DIAGNOSIS — E785 Hyperlipidemia, unspecified: Secondary | ICD-10-CM | POA: Diagnosis not present

## 2022-01-15 DIAGNOSIS — N1831 Chronic kidney disease, stage 3a: Secondary | ICD-10-CM | POA: Diagnosis not present

## 2022-01-15 DIAGNOSIS — N183 Chronic kidney disease, stage 3 unspecified: Secondary | ICD-10-CM | POA: Diagnosis not present

## 2022-01-15 DIAGNOSIS — D631 Anemia in chronic kidney disease: Secondary | ICD-10-CM | POA: Diagnosis not present

## 2022-01-15 DIAGNOSIS — N2581 Secondary hyperparathyroidism of renal origin: Secondary | ICD-10-CM | POA: Diagnosis not present

## 2022-01-21 LAB — BASIC METABOLIC PANEL: Glucose: 112

## 2022-01-25 DIAGNOSIS — E875 Hyperkalemia: Secondary | ICD-10-CM | POA: Diagnosis not present

## 2022-01-30 ENCOUNTER — Encounter: Payer: Self-pay | Admitting: *Deleted

## 2022-02-26 IMAGING — US US RENAL
1 series · 14 of 25 positions shown · non-contrast
Comparison: None.

CLINICAL DATA: CKD

EXAM:
RENAL / URINARY TRACT ULTRASOUND COMPLETE

[Series 1: us renal · 0.23mm/px · 14 of 42 slices shown]
[im 1/42]
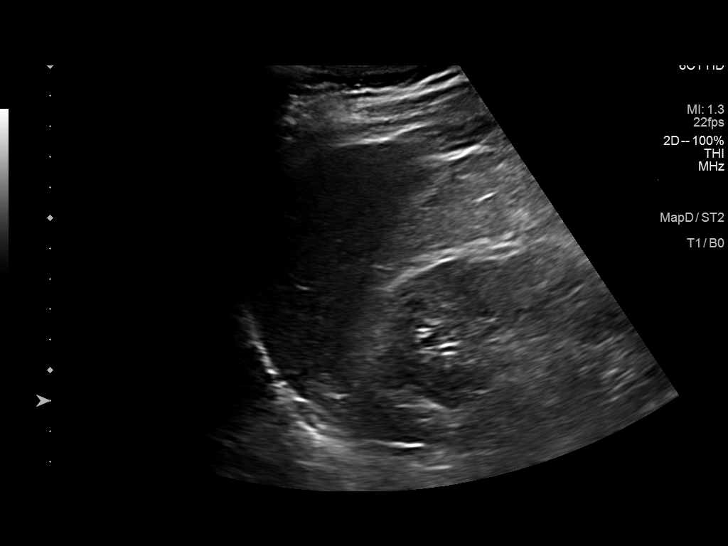
[im 4/42]
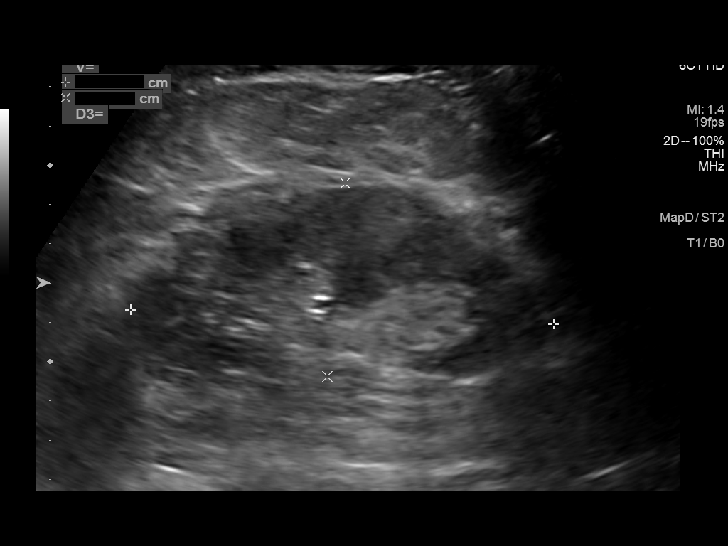
[im 7/42]
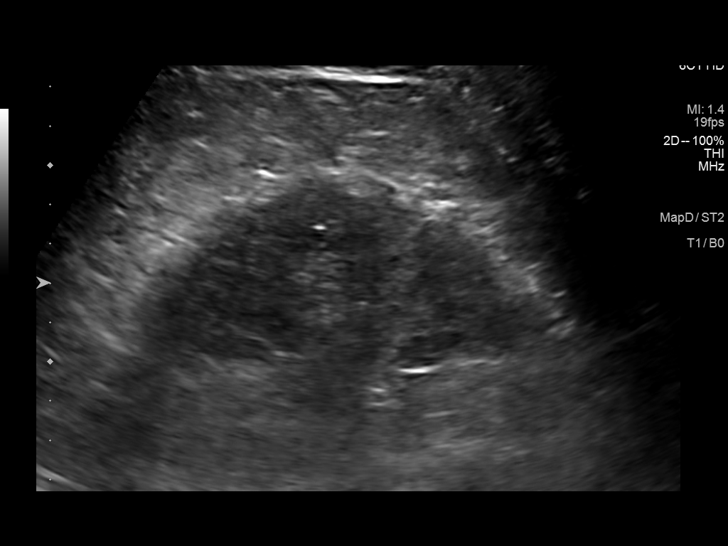
[im 11/42]
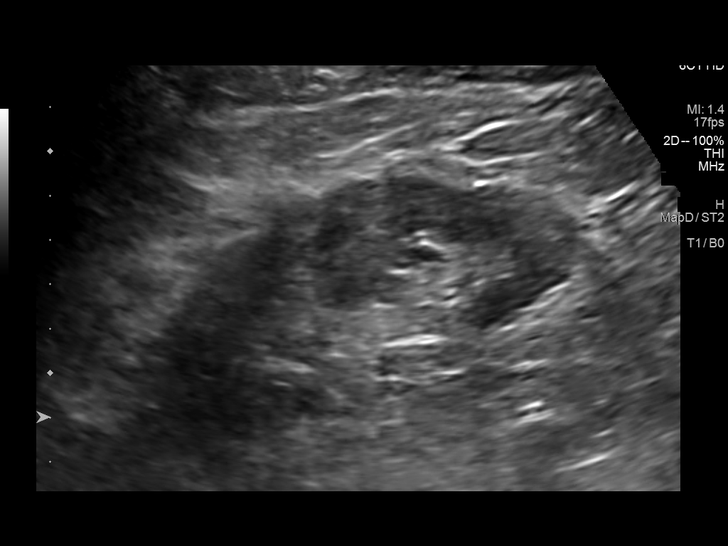
[im 14/42]
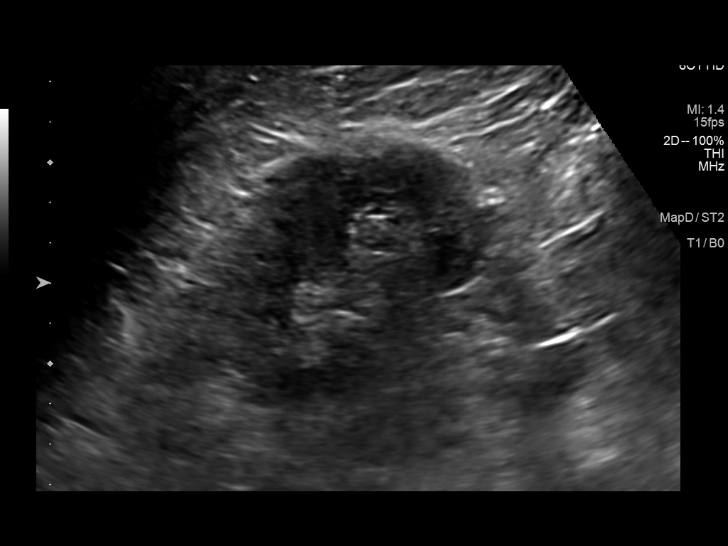
[im 16/42]
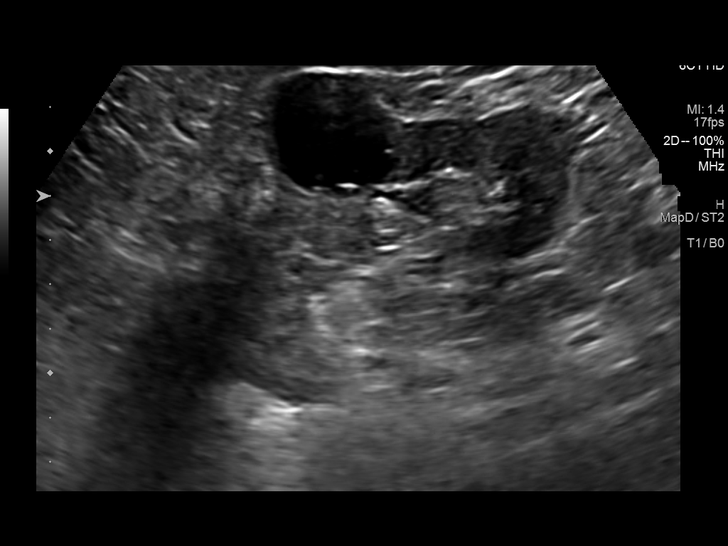
[im 19/42]
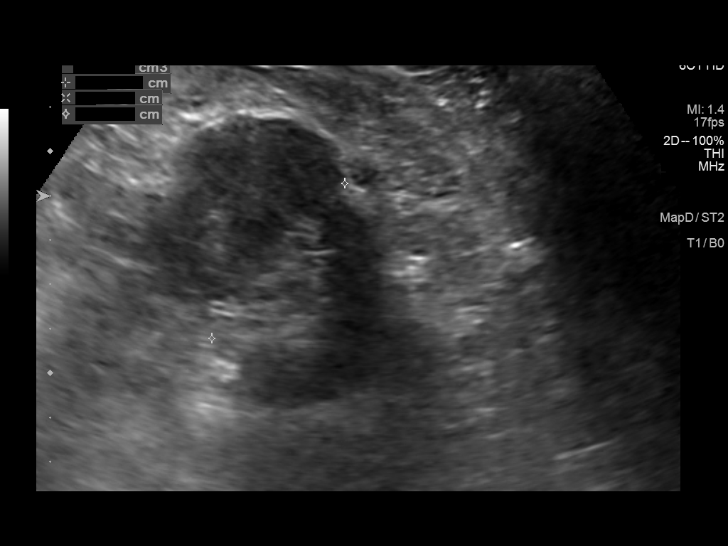
[im 23/42]
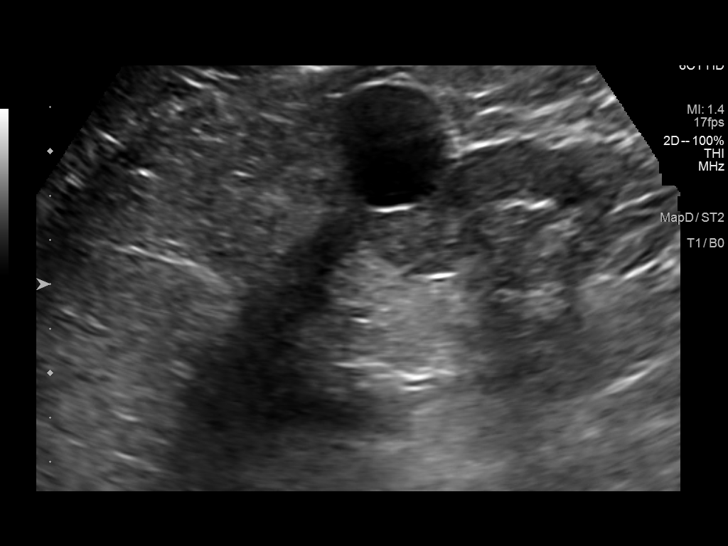
[im 26/42]
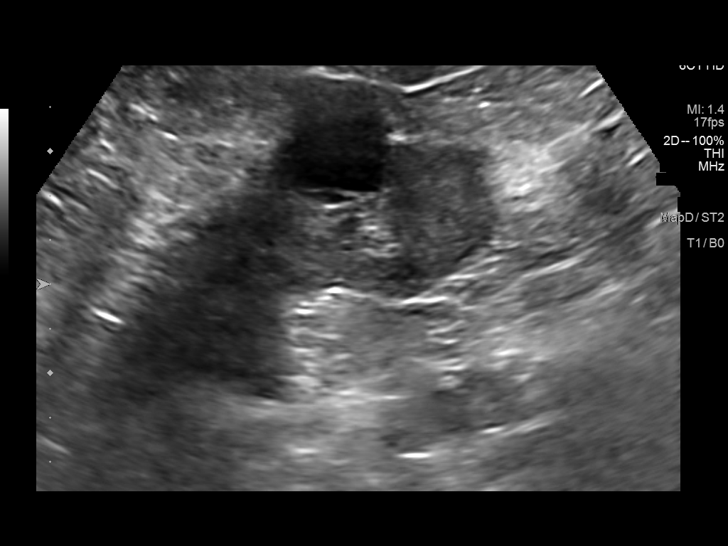
[im 28/42]
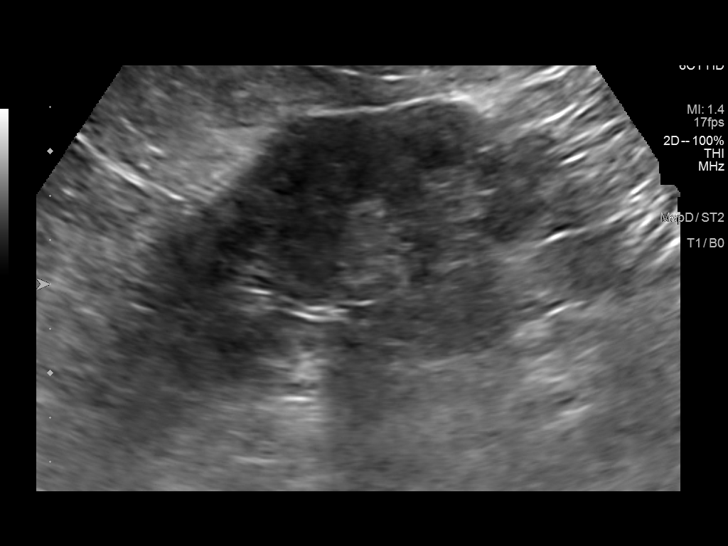
[im 31/42]
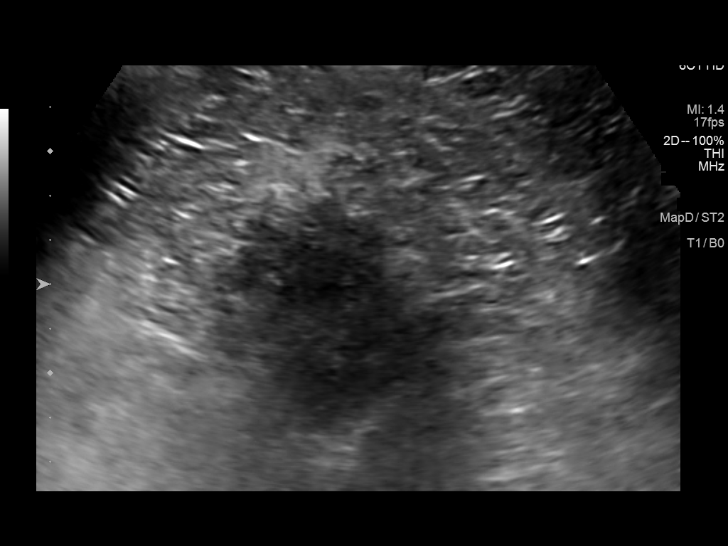
[im 35/42]
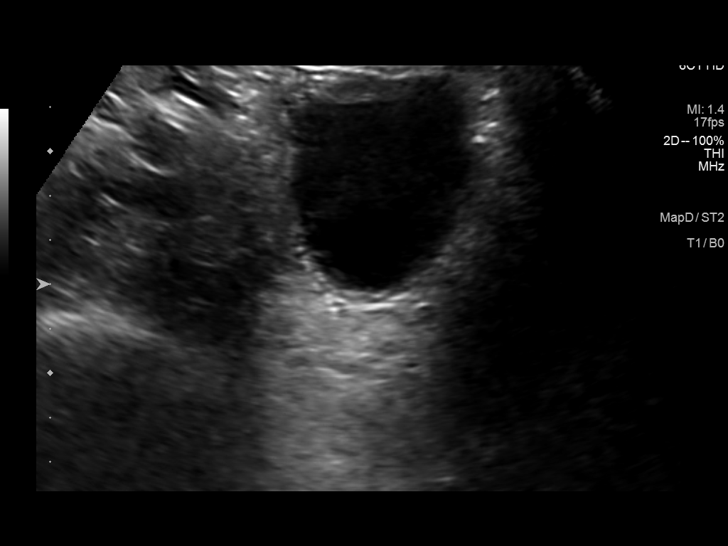
[im 38/42]
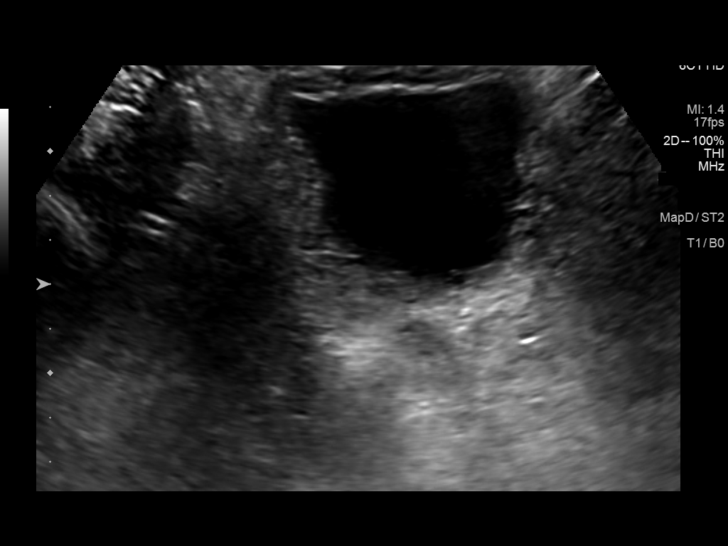
[im 42/42]
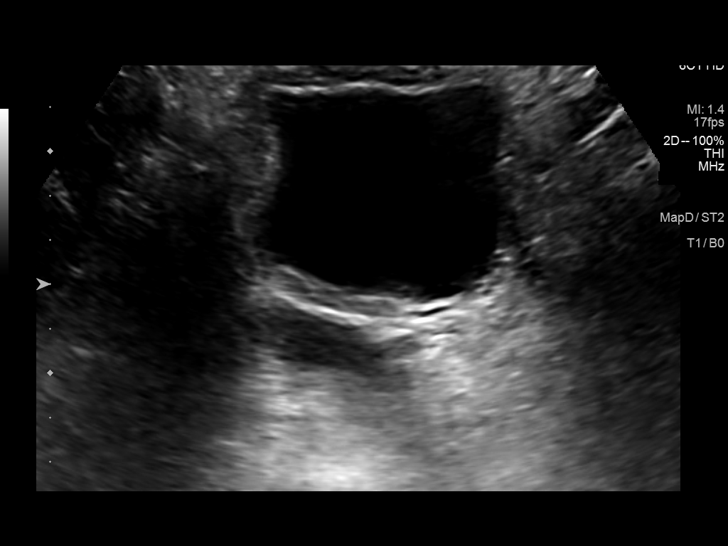

[14 of 25 positions shown; findings below may reference images not displayed]

FINDINGS: Right Kidney:

Renal measurements: 10.8 x 4.9 x 5.2 cm = volume: 146 mL.
Echogenicity within normal limits. No mass or hydronephrosis
visualized.

Left Kidney:

Renal measurements: 11.2 x 5.3 x 4.6 cm = volume: 144 mL.
Echogenicity within normal limits. No hydronephrosis. There is an
exophytic cyst measuring 3.0 cm.

Bladder:

Appears normal for degree of bladder distention.

Other:

Prostate measures 4.0 x 6.1 x 4.2 cm.
IMPRESSION: Left renal simple cyst measuring 3.0 cm. Otherwise unremarkable
appearance of the bilateral kidneys.

## 2022-07-03 ENCOUNTER — Ambulatory Visit: Payer: Medicare HMO | Admitting: Internal Medicine

## 2022-07-04 ENCOUNTER — Encounter: Payer: Self-pay | Admitting: Internal Medicine

## 2022-07-04 ENCOUNTER — Ambulatory Visit (INDEPENDENT_AMBULATORY_CARE_PROVIDER_SITE_OTHER): Payer: Medicare HMO | Admitting: Internal Medicine

## 2022-07-04 VITALS — BP 128/74 | HR 72 | Temp 97.7°F | Ht 68.0 in | Wt 180.0 lb

## 2022-07-04 DIAGNOSIS — E538 Deficiency of other specified B group vitamins: Secondary | ICD-10-CM

## 2022-07-04 DIAGNOSIS — E559 Vitamin D deficiency, unspecified: Secondary | ICD-10-CM | POA: Diagnosis not present

## 2022-07-04 DIAGNOSIS — I10 Essential (primary) hypertension: Secondary | ICD-10-CM

## 2022-07-04 DIAGNOSIS — Z125 Encounter for screening for malignant neoplasm of prostate: Secondary | ICD-10-CM

## 2022-07-04 DIAGNOSIS — N1831 Chronic kidney disease, stage 3a: Secondary | ICD-10-CM

## 2022-07-04 DIAGNOSIS — E1159 Type 2 diabetes mellitus with other circulatory complications: Secondary | ICD-10-CM | POA: Diagnosis not present

## 2022-07-04 DIAGNOSIS — Z1211 Encounter for screening for malignant neoplasm of colon: Secondary | ICD-10-CM | POA: Diagnosis not present

## 2022-07-04 DIAGNOSIS — Z0001 Encounter for general adult medical examination with abnormal findings: Secondary | ICD-10-CM

## 2022-07-04 DIAGNOSIS — E785 Hyperlipidemia, unspecified: Secondary | ICD-10-CM

## 2022-07-04 LAB — URINALYSIS, ROUTINE W REFLEX MICROSCOPIC
Bilirubin Urine: NEGATIVE
Hgb urine dipstick: NEGATIVE
Ketones, ur: NEGATIVE
Leukocytes,Ua: NEGATIVE
Nitrite: NEGATIVE
Specific Gravity, Urine: 1.025 (ref 1.000–1.030)
Total Protein, Urine: NEGATIVE
Urine Glucose: >=1000 — AB
Urobilinogen, UA: 0.2 (ref 0.0–1.0)
pH: 5.5 (ref 5.0–8.0)

## 2022-07-04 LAB — MICROALBUMIN / CREATININE URINE RATIO
Creatinine,U: 233.6 mg/dL
Microalb Creat Ratio: 0.4 mg/g (ref 0.0–30.0)
Microalb, Ur: 0.9 mg/dL (ref 0.0–1.9)

## 2022-07-04 LAB — CBC WITH DIFFERENTIAL/PLATELET
Basophils Absolute: 0 10*3/uL (ref 0.0–0.1)
Basophils Relative: 0.7 % (ref 0.0–3.0)
Eosinophils Absolute: 0.2 10*3/uL (ref 0.0–0.7)
Eosinophils Relative: 2.9 % (ref 0.0–5.0)
HCT: 40.2 % (ref 39.0–52.0)
Hemoglobin: 13.2 g/dL (ref 13.0–17.0)
Lymphocytes Relative: 31.2 % (ref 12.0–46.0)
Lymphs Abs: 1.8 10*3/uL (ref 0.7–4.0)
MCHC: 32.8 g/dL (ref 30.0–36.0)
MCV: 89.2 fl (ref 78.0–100.0)
Monocytes Absolute: 0.5 10*3/uL (ref 0.1–1.0)
Monocytes Relative: 7.9 % (ref 3.0–12.0)
Neutro Abs: 3.4 10*3/uL (ref 1.4–7.7)
Neutrophils Relative %: 57.3 % (ref 43.0–77.0)
Platelets: 206 10*3/uL (ref 150.0–400.0)
RBC: 4.51 Mil/uL (ref 4.22–5.81)
RDW: 13.4 % (ref 11.5–15.5)
WBC: 5.9 10*3/uL (ref 4.0–10.5)

## 2022-07-04 LAB — VITAMIN B12: Vitamin B-12: 372 pg/mL (ref 211–911)

## 2022-07-04 LAB — LIPID PANEL
Cholesterol: 104 mg/dL (ref 0–200)
HDL: 34.3 mg/dL — ABNORMAL LOW (ref >39.00)
LDL Cholesterol: 40 mg/dL (ref 0–99)
NonHDL: 69.79
Total CHOL/HDL Ratio: 3
Triglycerides: 147 mg/dL (ref 0.0–149.0)
VLDL: 29.4 mg/dL (ref 0.0–40.0)

## 2022-07-04 LAB — BASIC METABOLIC PANEL
BUN: 24 mg/dL — ABNORMAL HIGH (ref 6–23)
CO2: 28 mEq/L (ref 19–32)
Calcium: 9.6 mg/dL (ref 8.4–10.5)
Chloride: 106 mEq/L (ref 96–112)
Creatinine, Ser: 2.25 mg/dL — ABNORMAL HIGH (ref 0.40–1.50)
GFR: 28.2 mL/min — ABNORMAL LOW (ref >60.00)
Glucose, Bld: 100 mg/dL — ABNORMAL HIGH (ref 70–99)
Potassium: 5.7 mEq/L — ABNORMAL HIGH (ref 3.5–5.1)
Sodium: 138 mEq/L (ref 135–145)

## 2022-07-04 LAB — HEPATIC FUNCTION PANEL
ALT: 59 U/L — ABNORMAL HIGH (ref 0–53)
AST: 54 U/L — ABNORMAL HIGH (ref 0–37)
Albumin: 4.4 g/dL (ref 3.5–5.2)
Alkaline Phosphatase: 42 U/L (ref 39–117)
Bilirubin, Direct: 0.1 mg/dL (ref 0.0–0.3)
Total Bilirubin: 0.5 mg/dL (ref 0.2–1.2)
Total Protein: 7.6 g/dL (ref 6.0–8.3)

## 2022-07-04 LAB — PSA: PSA: 1.58 ng/mL (ref 0.10–4.00)

## 2022-07-04 LAB — TSH: TSH: 1.19 u[IU]/mL (ref 0.35–5.50)

## 2022-07-04 LAB — HEMOGLOBIN A1C: Hgb A1c MFr Bld: 6 % (ref 4.6–6.5)

## 2022-07-04 LAB — VITAMIN D 25 HYDROXY (VIT D DEFICIENCY, FRACTURES): VITD: 35.81 ng/mL (ref 30.00–100.00)

## 2022-07-04 NOTE — Patient Instructions (Signed)
Please continue all other medications as before, and refills have been done if requested.  Please have the pharmacy call with any other refills you may need.  Please continue your efforts at being more active, low cholesterol diet, and weight control.  You are otherwise up to date with prevention measures today.  Please keep your appointments with your specialists as you may have planned - Dr Candiss [facility]  You will be contacted regarding the referral for: colonoscopy  Please go to the LAB at the blood drawing area for the tests to be done  You will be contacted by phone if any changes need to be made immediately.  Otherwise, you will receive a letter about your results with an explanation, but please check with MyChart first.  Please remember to sign up for MyChart if you have not done so, as this will be important to you in the future with finding out test results, communicating by private email, and scheduling acute appointments online when needed.  Please make an Appointment to return in 6 months, or sooner if needed

## 2022-07-04 NOTE — Progress Notes (Signed)
Patient ID: Jesse Woods, male   DOB: [birth year], 72 y.o.   MRN: [id number]         Chief Complaint:: wellness exam and ckd, dm, hld, htn       HPI:  Jesse Woods is a 72 y.o. male here for wellness exam; declines covid booster, pt states will call for own yearly eye exam, due for colonoscopy, o/w up to date                        Also Pt denies chest pain, increased sob or doe, wheezing, orthopnea, PND, increased LE swelling, palpitations, dizziness or syncope.   Pt denies polydipsia, polyuria, or new focal neuro s/s.    Pt denies fever, wt loss, night sweats, loss of appetite, or other constitutional symptoms  Sees Dr Candiss Norse renal on regular basis.     Wt Readings from Last 3 Encounters:  07/04/22 180 lb (81.6 kg)  01/04/22 177 lb (80.3 kg)  10/22/21 178 lb 9.6 oz (81 kg)   BP Readings from Last 3 Encounters:  07/04/22 128/74  01/04/22 118/70  10/22/21 100/60   Immunization History  Administered Date(s) Administered   Fluad Quad(high Dose 65+) 12/08/2018, 12/27/2020   Influenza Split 12/02/2012   Influenza Whole 01/12/2007, 01/06/2012   Influenza, Seasonal, Injecte, Preservative Fre 11/22/2013   Influenza-Unspecified 11/28/2015, 11/06/2016, 12/08/2018, 01/03/2020, 01/14/2022   PFIZER(Purple Top)SARS-COV-2 Vaccination 06/10/2019, 07/06/2019, 01/03/2020   PNEUMOCOCCAL CONJUGATE-20 08/17/2021   Pneumococcal Conjugate-13 01/07/2017   Pneumococcal Polysaccharide-23 04/28/2012, 12/23/2014   Td 11/17/2009   Tdap 06/21/2020   Zoster Recombinat (Shingrix) 06/25/2021, 08/20/2021   There are no preventive care reminders to display for this patient.     Past Medical History:  Diagnosis Date   Arthritis    "back and left hip" (12/22/2014)   Chronic diastolic (congestive) heart failure (HCC)    Coronary artery disease 01/2015   chronic total occlusion of mid LCx and severely diseased RCA on previous cath in February 2016. EF at that time was 15%. He was cleared by neurosurgery  for DAPT and then was seen by Dr. Irish Lack to proceed with PCI of RCA.  LHC 12/22/2014 showed 90% proximal to mid RCA lesion that treated with 3.0 x 32 mm Synergy DES   DCM (dilated cardiomyopathy) ([place]) 06/22/2014   EF was 15% but now 35-40% after PCI. Echo 2019 with EF 60-65% with G1DD   Diabetes mellitus, type II ([place])    Hyperlipidemia    Hypertension    Subarachnoid hemorrhage ([place]) 1998   from HTN   Past Surgical History:  Procedure Laterality Date   [place]   right frontal ventriculotomy with subsequent ventriculo-abfdominal shunt due to [place] N/A 12/22/2014   Procedure: Coronary Stent Intervention;  Surgeon: Jettie Booze, MD;  Location: [facility];  Service: Cardiovascular;  Laterality: N/A;   CARDIAC CATHETERIZATION  12/22/2014   Procedure: Left Heart Cath and Coronary Angiography;  Surgeon: Jettie Booze, MD;  Location: [facility];  Service: Cardiovascular;;   CARDIAC CATHETERIZATION  06/24/2014   CORONARY ANGIOPLASTY     CSF SHUNT  1998   INGUINAL HERNIA REPAIR Right 1954   LEFT HEART CATHETERIZATION WITH CORONARY ANGIOGRAM N/A 06/24/2014   Procedure: LEFT HEART CATHETERIZATION WITH CORONARY ANGIOGRAM;  Surgeon: Jettie Booze, MD;  Location: [facility];  Service: Cardiovascular;  Laterality: N/A;    reports that he has never smoked. He  has never used smokeless tobacco. He reports current alcohol use. He reports that he does not use drugs. family history includes Cancer in his brother, cousin, father, and mother; Diabetes in his father; Hypertension in his father; Pancreatic cancer in his mother; Prostate cancer in his father and paternal uncle. No Known Allergies Current Outpatient Medications on File Prior to Visit  Medication Sig Dispense Refill   aspirin 81 MG tablet Take 81 mg by mouth daily.     carvedilol (COREG) 12.5 MG tablet TAKE 1 TABLET BY MOUTH TWICE DAILY 180 tablet 3   clopidogrel (PLAVIX) 75 MG  tablet Take 1 tablet (75 mg total) by mouth daily. 90 tablet 2   ezetimibe (ZETIA) 10 MG tablet TAKE 1 TABLET(10 MG) BY MOUTH DAILY 90 tablet 3   FARXIGA 10 MG TABS tablet Take 10 mg by mouth daily.     isosorbide mononitrate (IMDUR) 30 MG 24 hr tablet TAKE 1 TABLET(30 MG) BY MOUTH DAILY. . 90 tablet 3   lisinopril (ZESTRIL) 40 MG tablet Take 1 tablet (40 mg total) by mouth daily. 90 tablet 3   LOKELMA 10 g PACK packet SMARTSIG:1 Packet(s) By Mouth Every Other Day     metFORMIN (GLUCOPHAGE) 1000 MG tablet Take 1 tablet (1,000 mg total) by mouth 2 (two) times daily with a meal. 90 tablet 3   Multiple Vitamins-Minerals (MULTIVITAMIN WITH MINERALS) tablet Take 1 tablet by mouth daily.     nitroGLYCERIN (NITROSTAT) 0.4 MG SL tablet Place 1 tablet (0.4 mg total) under the tongue every 5 (five) minutes as needed for chest pain (x 3 tabs). 60 tablet 3   rosuvastatin (CRESTOR) 40 MG tablet TAKE 1 TABLET(40 MG) BY MOUTH DAILY 90 tablet 3   spironolactone (ALDACTONE) 25 MG tablet TAKE 1/2 TABLET(12.5 MG) BY MOUTH DAILY 45 tablet 3   tetrahydrozoline-zinc (VISINE-AC) 0.05-0.25 % ophthalmic solution Place 1 drop into both eyes daily as needed (allergy irritation.).     furosemide (LASIX) 20 MG tablet Take 1 tablet (20 mg total) by mouth as needed for edema. 90 tablet 3   No current facility-administered medications on file prior to visit.        ROS:  All others reviewed and negative.  Objective        PE:  BP 128/74   Pulse 72   Temp 97.7 F (36.5 C) (Oral)   Ht 5\' 8"  (1.727 m)   Wt 180 lb (81.6 kg)   SpO2 98%   BMI 27.37 kg/m                 Constitutional: Pt appears in NAD               HENT: Head: NCAT.                Right Ear: External ear normal.                 Left Ear: External ear normal.                Eyes: . Pupils are equal, round, and reactive to light. Conjunctivae and EOM are normal               Nose: without d/c or deformity               Neck: Neck supple. Gross normal  ROM               Cardiovascular: Normal rate and regular rhythm.  Pulmonary/Chest: Effort normal and breath sounds without rales or wheezing.                Abd:  Soft, NT, ND, + BS, no organomegaly               Neurological: Pt is alert. At baseline orientation, motor grossly intact               Skin: Skin is warm. No rashes, no other new lesions, LE edema - none               Psychiatric: Pt behavior is normal without agitation   Micro: none  Cardiac tracings I have personally interpreted today:  none  Pertinent Radiological findings (summarize): none   Lab Results  Component Value Date   WBC 5.9 07/04/2022   HGB 13.2 07/04/2022   HCT 40.2 07/04/2022   PLT 206.0 07/04/2022   GLUCOSE 100 (H) 07/04/2022   CHOL 104 07/04/2022   TRIG 147.0 07/04/2022   HDL 34.30 (L) 07/04/2022   LDLDIRECT 215.3 01/03/2011   LDLCALC 40 07/04/2022   ALT 59 (H) 07/04/2022   AST 54 (H) 07/04/2022   NA 138 07/04/2022   K 5.7 No hemolysis seen (H) 07/04/2022   CL 106 07/04/2022   CREATININE 2.25 (H) 07/04/2022   BUN 24 (H) 07/04/2022   CO2 28 07/04/2022   TSH 1.19 07/04/2022   PSA 1.58 07/04/2022   INR 1.1 (H) 12/19/2014   HGBA1C 6.0 07/04/2022   MICROALBUR 0.9 07/04/2022   Assessment/Plan:  Jesse Woods is a 72 y.o. Black or African American [2] male with  has a past medical history of Arthritis, Chronic diastolic (congestive) heart failure ([place]), Coronary artery disease (01/2015), DCM (dilated cardiomyopathy) ([place]) (06/22/2014), Diabetes mellitus, type II ([place]), Hyperlipidemia, Hypertension, and Subarachnoid hemorrhage ([place]) (1998).  Encounter for well adult exam with abnormal findings Age and sex appropriate education and counseling updated with regular exercise and diet Referrals for preventative services - pt states will call for eye exam, also for colonoscopy referral Immunizations addressed - declines covid booster Smoking counseling  - none needed Evidence for  depression or other mood disorder - none significant Most recent labs reviewed. I have personally reviewed and have noted: 1) the patient's medical and social history 2) The patient's current medications and supplements 3) The patient's height, weight, and BMI have been recorded in the chart   Diabetes mellitus ([place]) Lab Results  Component Value Date   HGBA1C 6.0 07/04/2022   Stable, pt to continue current medical treatment farxiga 10 qd, metformin 1000 bid    Dyslipidemia Lab Results  Component Value Date   LDLCALC 40 07/04/2022   Stable, pt to continue current statin crestor 40 mg qd   Essential hypertension BP Readings from Last 3 Encounters:  07/04/22 128/74  01/04/22 118/70  10/22/21 100/60   Stable, pt to continue medical treatment lisinopril 40 qd, coreg 12. 5 bid   Stage 3a chronic kidney disease ([place]) Lab Results  Component Value Date   CREATININE 2.25 (H) 07/04/2022   With mild worsening, pt cont to avoid nephrotoxins, hold lasix and aldactone x 5 days, then restart and f/u bmp 1 wk  Vitamin D deficiency Last vitamin D Lab Results  Component Value Date   VD25OH 35.81 07/04/2022   Low, to start oral replacement   Followup: Return in about 6 months (around 01/04/2023).  Cathlean Cower, MD 07/06/2022 6:43 PM [place]  [facility]

## 2022-07-06 ENCOUNTER — Encounter: Payer: Self-pay | Admitting: Internal Medicine

## 2022-07-06 DIAGNOSIS — E559 Vitamin D deficiency, unspecified: Secondary | ICD-10-CM | POA: Insufficient documentation

## 2022-07-06 NOTE — Assessment & Plan Note (Signed)
Age and sex appropriate education and counseling updated with regular exercise and diet Referrals for preventative services - pt states will call for eye exam, also for colonoscopy referral Immunizations addressed - declines covid booster Smoking counseling  - none needed Evidence for depression or other mood disorder - none significant Most recent labs reviewed. I have personally reviewed and have noted: 1) the patient's medical and social history 2) The patient's current medications and supplements 3) The patient's height, weight, and BMI have been recorded in the chart

## 2022-07-06 NOTE — Assessment & Plan Note (Signed)
Lab Results  Component Value Date   LDLCALC 40 07/04/2022   Stable, pt to continue current statin crestor 40 mg qd

## 2022-07-06 NOTE — Assessment & Plan Note (Addendum)
Lab Results  Component Value Date   CREATININE 2.25 (H) 07/04/2022   With mild worsening, pt cont to avoid nephrotoxins, hold lasix and aldactone x 5 days, then restart and f/u bmp 1 wk

## 2022-07-06 NOTE — Assessment & Plan Note (Signed)
Last vitamin D Lab Results  Component Value Date   VD25OH 35.81 07/04/2022   Low, to start oral replacement

## 2022-07-06 NOTE — Assessment & Plan Note (Signed)
Lab Results  Component Value Date   HGBA1C 6.0 07/04/2022   Stable, pt to continue current medical treatment farxiga 10 qd, metformin 1000 bid

## 2022-07-06 NOTE — Assessment & Plan Note (Signed)
BP Readings from Last 3 Encounters:  07/04/22 128/74  01/04/22 118/70  10/22/21 100/60   Stable, pt to continue medical treatment lisinopril 40 qd, coreg 12. 5 bid

## 2022-07-10 DIAGNOSIS — E875 Hyperkalemia: Secondary | ICD-10-CM | POA: Diagnosis not present

## 2022-07-10 DIAGNOSIS — D631 Anemia in chronic kidney disease: Secondary | ICD-10-CM | POA: Diagnosis not present

## 2022-07-10 DIAGNOSIS — N2581 Secondary hyperparathyroidism of renal origin: Secondary | ICD-10-CM | POA: Diagnosis not present

## 2022-07-10 DIAGNOSIS — I509 Heart failure, unspecified: Secondary | ICD-10-CM | POA: Diagnosis not present

## 2022-07-10 DIAGNOSIS — I130 Hypertensive heart and chronic kidney disease with heart failure and stage 1 through stage 4 chronic kidney disease, or unspecified chronic kidney disease: Secondary | ICD-10-CM | POA: Diagnosis not present

## 2022-07-10 DIAGNOSIS — N183 Chronic kidney disease, stage 3 unspecified: Secondary | ICD-10-CM | POA: Diagnosis not present

## 2022-07-16 ENCOUNTER — Other Ambulatory Visit: Payer: Self-pay

## 2022-07-16 MED ORDER — METFORMIN HCL 1000 MG PO TABS: 1000.0000 mg | ORAL_TABLET | Freq: Two times a day (BID) | ORAL | 3 refills | Status: DC

## 2022-07-29 ENCOUNTER — Other Ambulatory Visit: Payer: Self-pay | Admitting: Cardiology

## 2022-08-20 LAB — AMB REFERRAL TO GASTROENTEROLOGY: EGFR: 32

## 2022-10-04 ENCOUNTER — Other Ambulatory Visit: Payer: Self-pay | Admitting: Cardiology

## 2022-10-25 ENCOUNTER — Other Ambulatory Visit: Payer: Self-pay | Admitting: Cardiology

## 2022-10-25 DIAGNOSIS — I2510 Atherosclerotic heart disease of native coronary artery without angina pectoris: Secondary | ICD-10-CM

## 2022-11-11 ENCOUNTER — Other Ambulatory Visit: Payer: Self-pay

## 2022-11-11 ENCOUNTER — Other Ambulatory Visit: Payer: Self-pay | Admitting: Cardiology

## 2022-11-11 MED ORDER — EZETIMIBE 10 MG PO TABS: 10.0000 mg | ORAL_TABLET | Freq: Every day | ORAL | 0 refills | Status: DC

## 2022-11-22 ENCOUNTER — Other Ambulatory Visit: Payer: Self-pay | Admitting: Cardiology

## 2022-11-26 ENCOUNTER — Ambulatory Visit: Payer: Medicare HMO

## 2022-11-26 VITALS — BP 118/70 | HR 67 | Temp 98.2°F | Ht 68.0 in | Wt 179.6 lb

## 2022-11-26 DIAGNOSIS — Z0000 Encounter for general adult medical examination without abnormal findings: Secondary | ICD-10-CM

## 2022-11-26 NOTE — Patient Instructions (Signed)
Jesse Woods , Thank you for taking time to come for your Medicare Wellness Visit. I appreciate your ongoing commitment to your health goals. Please review the following plan we discussed and let me know if I can assist you in the future.   Referrals/Orders/Follow-Ups/Clinician Recommendations: No  This is a list of the screening recommended for you and due dates:  Health Maintenance  Topic Date Due   COVID-19 Vaccine (4 - 2023-24 season) 12/07/2021   Flu Shot  11/07/2022   Colon Cancer Screening  01/05/2023*   Eye exam for diabetics  07/04/2023*   Hemoglobin A1C  01/04/2023   Yearly kidney health urinalysis for diabetes  07/04/2023   Complete foot exam   07/04/2023   Yearly kidney function blood test for diabetes  08/20/2023   Medicare Annual Wellness Visit  11/26/2023   DTaP/Tdap/Td vaccine (3 - Td or Tdap) 06/22/2030   Pneumonia Vaccine  Completed   Hepatitis C Screening  Completed   Zoster (Shingles) Vaccine  Completed   HPV Vaccine  Aged Out  *Topic was postponed. The date shown is not the original due date.    Advanced directives: (Declined) Advance directive discussed with you today. Even though you declined this today, please call our office should you change your mind, and we can give you the proper paperwork for you to fill out.  Next Medicare Annual Wellness Visit scheduled for next year: No  Preventive Care 74 Years and Older, Male  Preventive care refers to lifestyle choices and visits with your health care provider that can promote health and wellness. What does preventive care include? A yearly physical exam. This is also called an annual well check. Dental exams once or twice a year. Routine eye exams. Ask your health care provider how often you should have your eyes checked. Personal lifestyle choices, including: Daily care of your teeth and gums. Regular physical activity. Eating a healthy diet. Avoiding tobacco and drug use. Limiting alcohol use. Practicing  safe sex. Taking low doses of aspirin every day. Taking vitamin and mineral supplements as recommended by your health care provider. What happens during an annual well check? The services and screenings done by your health care provider during your annual well check will depend on your age, overall health, lifestyle risk factors, and family history of disease. Counseling  Your health care provider may ask you questions about your: Alcohol use. Tobacco use. Drug use. Emotional well-being. Home and relationship well-being. Sexual activity. Eating habits. History of falls. Memory and ability to understand (cognition). Work and work Astronomer. Screening  You may have the following tests or measurements: Height, weight, and BMI. Blood pressure. Lipid and cholesterol levels. These may be checked every 5 years, or more frequently if you are over 40 years old. Skin check. Lung cancer screening. You may have this screening every year starting at age 89 if you have a 30-pack-year history of smoking and currently smoke or have quit within the past 15 years. Fecal occult blood test (FOBT) of the stool. You may have this test every year starting at age 15. Flexible sigmoidoscopy or colonoscopy. You may have a sigmoidoscopy every 5 years or a colonoscopy every 10 years starting at age 77. Prostate cancer screening. Recommendations will vary depending on your family history and other risks. Hepatitis C blood test. Hepatitis B blood test. Sexually transmitted disease (STD) testing. Diabetes screening. This is done by checking your blood sugar (glucose) after you have not eaten for a while (fasting). You may  have this done every 1-3 years. Abdominal aortic aneurysm (AAA) screening. You may need this if you are a current or former smoker. Osteoporosis. You may be screened starting at age 17 if you are at high risk. Talk with your health care provider about your test results, treatment options, and  if necessary, the need for more tests. Vaccines  Your health care provider may recommend certain vaccines, such as: Influenza vaccine. This is recommended every year. Tetanus, diphtheria, and acellular pertussis (Tdap, Td) vaccine. You may need a Td booster every 10 years. Zoster vaccine. You may need this after age 24. Pneumococcal 13-valent conjugate (PCV13) vaccine. One dose is recommended after age 30. Pneumococcal polysaccharide (PPSV23) vaccine. One dose is recommended after age 64. Talk to your health care provider about which screenings and vaccines you need and how often you need them. This information is not intended to replace advice given to you by your health care provider. Make sure you discuss any questions you have with your health care provider. Document Released: 04/21/2015 Document Revised: 12/13/2015 Document Reviewed: 01/24/2015 Elsevier Interactive Patient Education  2017 [employer].  Fall Prevention in the Home Falls can cause injuries. They can happen to people of all ages. There are many things you can do to make your home safe and to help prevent falls. What can I do on the outside of my home? Regularly fix the edges of walkways and driveways and fix any cracks. Remove anything that might make you trip as you walk through a door, such as a raised step or threshold. Trim any bushes or trees on the path to your home. Use bright outdoor lighting. Clear any walking paths of anything that might make someone trip, such as rocks or tools. Regularly check to see if handrails are loose or broken. Make sure that both sides of any steps have handrails. Any raised decks and porches should have guardrails on the edges. Have any leaves, snow, or ice cleared regularly. Use sand or salt on walking paths during winter. Clean up any spills in your garage right away. This includes oil or grease spills. What can I do in the bathroom? Use night lights. Install grab bars by the  toilet and in the tub and shower. Do not use towel bars as grab bars. Use non-skid mats or decals in the tub or shower. If you need to sit down in the shower, use a plastic, non-slip stool. Keep the floor dry. Clean up any water that spills on the floor as soon as it happens. Remove soap buildup in the tub or shower regularly. Attach bath mats securely with double-sided non-slip rug tape. Do not have throw rugs and other things on the floor that can make you trip. What can I do in the bedroom? Use night lights. Make sure that you have a light by your bed that is easy to reach. Do not use any sheets or blankets that are too big for your bed. They should not hang down onto the floor. Have a firm chair that has side arms. You can use this for support while you get dressed. Do not have throw rugs and other things on the floor that can make you trip. What can I do in the kitchen? Clean up any spills right away. Avoid walking on wet floors. Keep items that you use a lot in easy-to-reach places. If you need to reach something above you, use a strong step stool that has a grab bar. Keep electrical cords  out of the way. Do not use floor polish or wax that makes floors slippery. If you must use wax, use non-skid floor wax. Do not have throw rugs and other things on the floor that can make you trip. What can I do with my stairs? Do not leave any items on the stairs. Make sure that there are handrails on both sides of the stairs and use them. Fix handrails that are broken or loose. Make sure that handrails are as long as the stairways. Check any carpeting to make sure that it is firmly attached to the stairs. Fix any carpet that is loose or worn. Avoid having throw rugs at the top or bottom of the stairs. If you do have throw rugs, attach them to the floor with carpet tape. Make sure that you have a light switch at the top of the stairs and the bottom of the stairs. If you do not have them, ask someone  to add them for you. What else can I do to help prevent falls? Wear shoes that: Do not have high heels. Have rubber bottoms. Are comfortable and fit you well. Are closed at the toe. Do not wear sandals. If you use a stepladder: Make sure that it is fully opened. Do not climb a closed stepladder. Make sure that both sides of the stepladder are locked into place. Ask someone to hold it for you, if possible. Clearly mark and make sure that you can see: Any grab bars or handrails. First and last steps. Where the edge of each step is. Use tools that help you move around (mobility aids) if they are needed. These include: Canes. Walkers. Scooters. Crutches. Turn on the lights when you go into a dark area. Replace any light bulbs as soon as they burn out. Set up your furniture so you have a clear path. Avoid moving your furniture around. If any of your floors are uneven, fix them. If there are any pets around you, be aware of where they are. Review your medicines with your doctor. Some medicines can make you feel dizzy. This can increase your chance of falling. Ask your doctor what other things that you can do to help prevent falls. This information is not intended to replace advice given to you by your health care provider. Make sure you discuss any questions you have with your health care provider. Document Released: 01/19/2009 Document Revised: 08/31/2015 Document Reviewed: 04/29/2014 Elsevier Interactive Patient Education  2017 [employer].

## 2022-11-26 NOTE — Progress Notes (Addendum)
Subjective:   Jesse Woods is a 72 y.o. male who presents for Medicare Annual/Subsequent preventive examination.  Visit Complete: In person  Review of Systems:    No ROS. Medicare Wellness Visit. Additional risk factors are reflected in social history.  Cardiac Risk Factors include: advanced age (>89men, >70 women);diabetes mellitus;dyslipidemia;family history of premature cardiovascular disease;hypertension;male gender     Objective:    Today's Vitals   11/26/22 1259  BP: 118/70  Pulse: 67  Temp: 98.2 F (36.8 C)  TempSrc: Temporal  SpO2: 99%  Weight: 179 lb 9.6 oz (81.5 kg)  Height: 5\' 8"  (1.727 m)  PainSc: 0-No pain   Body mass index is 27.31 kg/m.     11/26/2022    3:11 PM 11/26/2021    3:36 PM 08/03/2020    1:04 PM 06/06/2016    8:14 AM 11/29/2015   10:24 AM 12/22/2014    7:23 AM 06/24/2014    9:17 AM  Advanced Directives  Does Patient Have a Medical Advance Directive? No No No No No No No  Would patient like information on creating a medical advance directive? No - Patient declined No - Patient declined No - Patient declined  Yes - Educational materials given No - patient declined information No - patient declined information    Current Medications (verified) Outpatient Encounter Medications as of 11/26/2022  Medication Sig   aspirin 81 MG tablet Take 81 mg by mouth daily.   carvedilol (COREG) 12.5 MG tablet TAKE 1 TABLET BY MOUTH TWICE DAILY   clopidogrel (PLAVIX) 75 MG tablet TAKE 1 TABLET(75 MG) BY MOUTH DAILY   ezetimibe (ZETIA) 10 MG tablet Take 1 tablet (10 mg total) by mouth daily. Please keep scheduled appointment for future refills. Thank you.   FARXIGA 10 MG TABS tablet Take 10 mg by mouth daily.   furosemide (LASIX) 20 MG tablet Take 1 tablet (20 mg total) by mouth as needed for edema.   isosorbide mononitrate (IMDUR) 30 MG 24 hr tablet TAKE 1 TABLET(30 MG) BY MOUTH DAILY   lisinopril (ZESTRIL) 40 MG tablet Take 1 tablet (40 mg total) by mouth  daily.   LOKELMA 10 g PACK packet SMARTSIG:1 Packet(s) By Mouth Every Other Day   metFORMIN (GLUCOPHAGE) 1000 MG tablet Take 1 tablet (1,000 mg total) by mouth 2 (two) times daily with a meal.   Multiple Vitamins-Minerals (MULTIVITAMIN WITH MINERALS) tablet Take 1 tablet by mouth daily.   nitroGLYCERIN (NITROSTAT) 0.4 MG SL tablet Place 1 tablet (0.4 mg total) under the tongue every 5 (five) minutes as needed for chest pain (x 3 tabs).   rosuvastatin (CRESTOR) 40 MG tablet TAKE 1 TABLET(40 MG) BY MOUTH DAILY   spironolactone (ALDACTONE) 25 MG tablet TAKE 1/2 TABLET(12.5 MG) BY MOUTH DAILY   tetrahydrozoline-zinc (VISINE-AC) 0.05-0.25 % ophthalmic solution Place 1 drop into both eyes daily as needed (allergy irritation.).   No facility-administered encounter medications on file as of 11/26/2022.    Allergies (verified) Patient has no known allergies.   History: Past Medical History:  Diagnosis Date   Arthritis    "back and left hip" (12/22/2014)   Chronic diastolic (congestive) heart failure (HCC)    Coronary artery disease 01/2015   chronic total occlusion of mid LCx and severely diseased RCA on previous cath in February 2016. EF at that time was 15%. He was cleared by neurosurgery for DAPT and then was seen by Dr. Eldridge Dace to proceed with PCI of RCA.  LHC 12/22/2014 showed 90% proximal to  mid RCA lesion that treated with 3.0 x 32 mm Synergy DES   DCM (dilated cardiomyopathy) (HCC) 06/22/2014   EF was 15% but now 35-40% after PCI. Echo 2019 with EF 60-65% with G1DD   Diabetes mellitus, type II (HCC)    Hyperlipidemia    Hypertension    Subarachnoid hemorrhage (HCC) 1998   from HTN   Past Surgical History:  Procedure Laterality Date   BRAIN SURGERY  1998   right frontal ventriculotomy with subsequent ventriculo-abfdominal shunt due to [facility]   CARDIAC CATHETERIZATION N/A 12/22/2014   Procedure: Coronary Stent Intervention;  Surgeon: Corky Crafts, MD;  Location: [facility] INVASIVE CV LAB;   Service: Cardiovascular;  Laterality: N/A;   CARDIAC CATHETERIZATION  12/22/2014   Procedure: Left Heart Cath and Coronary Angiography;  Surgeon: Corky Crafts, MD;  Location: [facility] INVASIVE CV LAB;  Service: Cardiovascular;;   CARDIAC CATHETERIZATION  06/24/2014   CORONARY ANGIOPLASTY     CSF SHUNT  1998   INGUINAL HERNIA REPAIR Right 1954   LEFT HEART CATHETERIZATION WITH CORONARY ANGIOGRAM N/A 06/24/2014   Procedure: LEFT HEART CATHETERIZATION WITH CORONARY ANGIOGRAM;  Surgeon: Corky Crafts, MD;  Location: [facility] CATH LAB;  Service: Cardiovascular;  Laterality: N/A;   Family History  Problem Relation Age of Onset   Cancer Mother        pancreatic   Pancreatic cancer Mother    Diabetes Father    Hypertension Father    Cancer Father        prostate cancer   Prostate cancer Father    Cancer Brother        prostate cancer - cause of death   Cancer Cousin        prostate cancer   Prostate cancer Paternal Uncle    Social History   Socioeconomic History   Marital status: Divorced    Spouse name: Not on file   Number of children: 2   Years of education: Not on file   Highest education level: Not on file  Occupational History   Occupation: retired  Tobacco Use   Smoking status: Never   Smokeless tobacco: Never  Substance and Sexual Activity   Alcohol use: Yes    Comment: 12/22/2014 "no alcohol since 1998"   Drug use: No   Sexual activity: Not Currently    Partners: Female  Other Topics Concern   Not on file  Social History Narrative   HSG, 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 Married- '72-'86; remarried '86-'90, single. 1 son- '73, 1 daughter-'79; 2 grandchildren. Retired after [employer], worked for [employer] before retirement. Had served in [employer] force. He did live in [place]. Lives together with his son. Very active in his church   Social Determinants of Health   Financial Resource Strain: Low Risk  (11/26/2022)   Overall Financial  Resource Strain (CARDIA)    Difficulty of Paying Living Expenses: Not hard at all  Food Insecurity: No Food Insecurity (11/26/2022)   Hunger Vital Sign    Worried About Running Out of Food in the Last Year: Never true    Ran Out of Food in the Last Year: Never true  Transportation Needs: No Transportation Needs (11/26/2022)   PRAPARE - Administrator, Civil Service (Medical): No    Lack of Transportation (Non-Medical): No  Physical Activity: Sufficiently Active (11/26/2022)   Exercise Vital Sign    Days of Exercise per Week: 5 days    Minutes of Exercise per Session: 30 min  Stress: No Stress Concern Present (11/26/2022)   [employer] of Occupational Health - Occupational Stress Questionnaire    Feeling of Stress : Not at all  Social Connections: Moderately Integrated (11/26/2022)   Social Connection and Isolation Panel [NHANES]    Frequency of Communication with Friends and Family: More than three times a week    Frequency of Social Gatherings with Friends and Family: More than three times a week    Attends Religious Services: More than 4 times per year    Active Member of [employer] or Organizations: Yes    Attends Engineer, structural: More than 4 times per year    Marital Status: Divorced    Tobacco Counseling Counseling given: Not Answered   Clinical Intake:  Pre-visit preparation completed: Yes  Pain : No/denies pain Pain Score: 0-No pain     Nutritional Risks: None Diabetes: Yes CBG done?: No Did pt. bring in CBG monitor from home?: No  How often do you need to have someone help you when you read instructions, pamphlets, or other written materials from your doctor or pharmacy?: 1 - Never What is the last grade level you completed in school?: [employer]  Interpreter Needed?: No  Information entered by :: Rochell Mabie N. Justis Dupas, LPN.   Activities of Daily Living    11/26/2022    3:11 PM  In your present state of  health, do you have any difficulty performing the following activities:  Hearing? 0  Vision? 0  Difficulty concentrating or making decisions? 0  Walking or climbing stairs? 0  Dressing or bathing? 0  Doing errands, shopping? 0  Preparing Food and eating ? N  Using the Toilet? N  In the past six months, have you accidently leaked urine? N  Do you have problems with loss of bowel control? N  Managing your Medications? N  Managing your Finances? N  Housekeeping or managing your Housekeeping? N    Patient Care Team: Corwin Levins, MD as PCP - General (Internal Medicine) Quintella Reichert, MD as PCP - Cardiology (Cardiology) Ernesto Rutherford, MD (Ophthalmology)  Indicate any recent Medical Services you may have received from other than [facility] providers in the past year (date may be approximate).     Assessment:   This is a routine wellness examination for [place].  Hearing/Vision screen Hearing Screening - Comments:: Denies hearing difficulties   Vision Screening - Comments:: Wears rx glasses - up to date with routine eye exams with Ernesto Rutherford, MD.   Dietary issues and exercise activities discussed:     Goals Addressed   None   Depression Screen    11/26/2022    3:10 PM 07/04/2022    2:31 PM 01/04/2022   11:21 AM 11/26/2021    3:38 PM 07/04/2021   11:27 AM 07/04/2021   10:52 AM 12/27/2020    9:46 AM  PHQ 2/9 Scores  PHQ - 2 Score 0 0  0 0 0 0  PHQ- 9 Score 0 0       Exception Documentation   Patient refusal        Fall Risk    11/26/2022    3:11 PM 07/04/2022    2:31 PM 01/04/2022   11:21 AM 11/26/2021    3:38 PM 07/04/2021   11:27 AM  Fall Risk   Falls in the past year? 0 0 0 0 0  Number falls in past yr: 0 0  0 0  Injury with Fall? 0 0  0  0  Risk for fall due to : No Fall Risks No Fall Risks No Fall Risks No Fall Risks   Follow up Falls prevention discussed Falls evaluation completed;Education provided Falls evaluation completed Falls evaluation completed      MEDICARE RISK AT HOME: Medicare Risk at Home Any stairs in or around the home?: Yes If so, are there any without handrails?: No Home free of loose throw rugs in walkways, pet beds, electrical cords, etc?: Yes Adequate lighting in your home to reduce risk of falls?: Yes Life alert?: No Use of a cane, walker or w/c?: No Grab bars in the bathroom?: No Shower chair or bench in shower?: No Elevated toilet seat or a handicapped toilet?: No  TIMED UP AND GO:  Was the test performed?  Yes  Length of time to ambulate 10 feet: 8 sec Gait steady and fast without use of assistive device    Cognitive Function:        11/26/2022    3:12 PM 11/26/2021    3:46 PM  6CIT Screen  What Year? 0 points 0 points  What month? 0 points 0 points  What time? 0 points 0 points  Count back from 20 0 points 0 points  Months in reverse 0 points 0 points  Repeat phrase 0 points 0 points  Total Score 0 points 0 points    Immunizations Immunization History  Administered Date(s) Administered   Fluad Quad(high Dose 65+) 12/08/2018, 12/27/2020   Influenza Split 12/02/2012   Influenza Whole 01/12/2007, 01/06/2012   Influenza, Seasonal, Injecte, Preservative Fre 11/22/2013   Influenza-Unspecified 11/28/2015, 11/06/2016, 12/08/2018, 01/03/2020, 01/14/2022   PFIZER(Purple Top)SARS-COV-2 Vaccination 06/10/2019, 07/06/2019, 01/03/2020   PNEUMOCOCCAL CONJUGATE-20 08/17/2021   Pneumococcal Conjugate-13 01/07/2017   Pneumococcal Polysaccharide-23 04/28/2012, 12/23/2014   Td 11/17/2009   Tdap 06/21/2020   Zoster Recombinant(Shingrix) 06/25/2021, 08/20/2021    TDAP status: Up to date  Flu Vaccine status: Up to date  Pneumococcal vaccine status: Up to date  Covid-19 vaccine status: Completed vaccines  Qualifies for Shingles Vaccine? Yes   Zostavax completed No   Shingrix Completed?: Yes  Screening Tests Health Maintenance  Topic Date Due   COVID-19 Vaccine (4 - 2023-24 season) 12/07/2021    INFLUENZA VACCINE  11/07/2022   Colonoscopy  01/05/2023 (Originally 06/06/2021)   OPHTHALMOLOGY EXAM  07/04/2023 (Originally 07/10/2022)   HEMOGLOBIN A1C  01/04/2023   Diabetic kidney evaluation - Urine ACR  07/04/2023   FOOT EXAM  07/04/2023   Diabetic kidney evaluation - eGFR measurement  08/20/2023   Medicare Annual Wellness (AWV)  11/26/2023   DTaP/Tdap/Td (3 - Td or Tdap) 06/22/2030   Pneumonia Vaccine 83+ Years old  Completed   Hepatitis C Screening  Completed   Zoster Vaccines- Shingrix  Completed   HPV VACCINES  Aged Out    Health Maintenance  Health Maintenance Due  Topic Date Due   COVID-19 Vaccine (4 - 2023-24 season) 12/07/2021   INFLUENZA VACCINE  11/07/2022    Colorectal cancer screening: Type of screening: Colonoscopy. Completed 06/06/2016. Repeat every 5 years (Postponed by pcp)  Lung Cancer Screening: (Low Dose CT Chest recommended if Age 20-80 years, 20 pack-year currently smoking OR have quit w/in 15years.) does not qualify.   Lung Cancer Screening Referral: no  Additional Screening:  Hepatitis C Screening: does qualify; Completed 03/20/2016  Vision Screening: Recommended annual ophthalmology exams for early detection of glaucoma and other disorders of the eye. Is the patient up to date with their annual eye exam?  Yes  Who is the provider or what is the name of the office in which the patient attends annual eye exams? Ernesto Rutherford, MD. If pt is not established with a provider, would they like to be referred to a provider to establish care? No .   Dental Screening: Recommended annual dental exams for proper oral hygiene  Diabetic Foot Exam: Diabetic Foot Exam: Completed 06/14/2022  Community Resource Referral / Chronic Care Management: CRR required this visit?  No   CCM required this visit?  No     Plan:     I have personally reviewed and noted the following in the patient's chart:   Medical and social history Use of alcohol, tobacco or illicit drugs   Current medications and supplements including opioid prescriptions. Patient is not currently taking opioid prescriptions. Functional ability and status Nutritional status Physical activity Advanced directives List of other physicians Hospitalizations, surgeries, and ER visits in previous 12 months Vitals Screenings to include cognitive, depression, and falls Referrals and appointments  In addition, I have reviewed and discussed with patient certain preventive protocols, quality metrics, and best practice recommendations. A written personalized care plan for preventive services as well as general preventive health recommendations were provided to patient.     Mickeal Needy, LPN   0/98/1191   After Visit Summary: Printed and mailed to patient.  Nurse Notes: Normal cognitive status assessed by direct observation by this Nurse Health Advisor. No abnormalities found.

## 2022-12-02 NOTE — Progress Notes (Deleted)
Cardiology Office Note:   Date:  12/02/2022  ID:  Jesse Woods, DOB July 20, 1950, MRN [id number] PCP: Jesse Levins, MD  [facility] Providers Cardiologist:  Jesse Magic, MD { Click to update primary MD,subspecialty MD or APP then REFRESH:1}   History of Present Illness:   Jesse Woods is a 72 y.o. male with history of hypertension, hyperlipidemia, DM type II, subarachnoid hemorrhage, CAD with ischemic cardiomyopathy.   He was noted to have chronic total occlusion of mid LCx and severely diseased RCA on previous cath in February 2016. EF at that time was 15%.  A FU echo in August 2016 showed EF had improved to 35-40%.   LHC 12/22/2014 showed 90% proximal to mid RCA lesion that treated with 3.0 x 32 mm Synergy DES. He had residual chronic total occlusion of mid left circumflex with left to left collaterals. Attempts made to place patient on Entresto but cost was prohibitive. Last TTE in 2019 showed LVEF 60%.   Today patient denies chest pain, shortness of breath, lower extremity edema, fatigue, palpitations, melena, hematuria, hemoptysis, diaphoresis, weakness, presyncope, syncope, orthopnea, and PND.   Studies Reviewed:    EKG:        ***  Risk Assessment/Calculations:   {Does this patient have ATRIAL FIBRILLATION?:[phone]} No BP recorded.  {Refresh Note OR Click here to enter BP  :1}***        Physical Exam:   VS:  There were no vitals taken for this visit.   Wt Readings from Last 3 Encounters:  11/26/22 179 lb 9.6 oz (81.5 kg)  07/04/22 180 lb (81.6 kg)  01/04/22 177 lb (80.3 kg)     Physical Exam   ASSESSMENT AND PLAN:   CAD (coronary artery disease), native coronary artery 2016 LHC with chronic total occlusion of mid Lcx and severely disease RCA. LVEF ~15%. A repeat TTE in August of that year showed LVEF improved to 35-40%. In September of 2016, patient found with 90% proximal to mid RCA lesion that treated with 3.0 x 32 mm Synergy DES. He had residual  chronic total occlusion of mid left circumflex with left to left collaterals. Since then, patient has continued without anginal symptoms. Has continued on ASA 81mg , Plavix 75mg  given significance of CAD. Continue carvedilol 12.5 mg twice daily, Imdur 30 mg daily, Crestor 40 mg daily.     Ischemic cardiomyopathy Patient with LVEF down to 15% in Feb 2016. EF improved to 35-40% on f/u echo Aug 2016.  As of his most recent echocardiogram April 2019, normalization of EF at 60 to 65%.  Chronic systolic CHF (congestive heart failure), NYHA class 2 (HCC) Patient with stable symptoms. Appears euvolemic on physical exam today. Continue Lisinopril, Coreg, Spironolactone, Farxiga, PRN lasix.        {Are you ordering a CV Procedure (e.g. stress test, cath, DCCV, TEE, etc)?   Press F2        :[id number]}   Signed, Jesse Gold, PA-C

## 2022-12-02 NOTE — Assessment & Plan Note (Deleted)
Patient with LVEF down to 15% in Feb 2016. EF improved to 35-40% on f/u echo Aug 2016.  As of his most recent echocardiogram April 2019, normalization of EF at 60 to 65%.

## 2022-12-02 NOTE — Assessment & Plan Note (Deleted)
LDL goal <70. Per labs:  Continue Crestor 40mg , Zetia 10mg .

## 2022-12-02 NOTE — Assessment & Plan Note (Deleted)
BP well controlled today. Continue carvedilol 12.5 mg twice daily, lisinopril 40 mg daily and spironolactone 12.5 mg daily

## 2022-12-02 NOTE — Assessment & Plan Note (Deleted)
2016 LHC with chronic total occlusion of mid Lcx and severely disease RCA. LVEF ~15%. A repeat TTE in August of that year showed LVEF improved to 35-40%. In September of 2016, patient found with 90% proximal to mid RCA lesion that treated with 3.0 x 32 mm Synergy DES. He had residual chronic total occlusion of mid left circumflex with left to left collaterals. Since then, patient has continued without anginal symptoms. Has continued on ASA 81mg , Plavix 75mg  given significance of CAD. Continue carvedilol 12.5 mg twice daily, Imdur 30 mg daily, Crestor 40 mg daily.

## 2022-12-02 NOTE — Assessment & Plan Note (Deleted)
Patient with stable symptoms. Appears euvolemic on physical exam today. Continue Lisinopril, Coreg, Spironolactone, Farxiga, PRN lasix.

## 2022-12-03 ENCOUNTER — Ambulatory Visit: Payer: Medicare HMO | Attending: Cardiology | Admitting: Cardiology

## 2022-12-03 DIAGNOSIS — I255 Ischemic cardiomyopathy: Secondary | ICD-10-CM

## 2022-12-03 DIAGNOSIS — I2510 Atherosclerotic heart disease of native coronary artery without angina pectoris: Secondary | ICD-10-CM

## 2022-12-03 DIAGNOSIS — I5022 Chronic systolic (congestive) heart failure: Secondary | ICD-10-CM

## 2022-12-03 DIAGNOSIS — E785 Hyperlipidemia, unspecified: Secondary | ICD-10-CM

## 2022-12-03 DIAGNOSIS — I10 Essential (primary) hypertension: Secondary | ICD-10-CM

## 2022-12-04 ENCOUNTER — Encounter: Payer: Self-pay | Admitting: Cardiology

## 2022-12-10 ENCOUNTER — Other Ambulatory Visit: Payer: Self-pay

## 2022-12-10 MED ORDER — ISOSORBIDE MONONITRATE ER 30 MG PO TB24: ORAL_TABLET | ORAL | 2 refills | Status: DC

## 2022-12-10 NOTE — Telephone Encounter (Signed)
Pt's medication was sent to pt's pharmacy as requested. Confirmation received.  °

## 2022-12-12 ENCOUNTER — Other Ambulatory Visit: Payer: Self-pay | Admitting: Cardiology

## 2022-12-31 ENCOUNTER — Ambulatory Visit: Payer: Medicare HMO | Admitting: Internal Medicine

## 2022-12-31 ENCOUNTER — Other Ambulatory Visit: Payer: Self-pay | Admitting: Cardiology

## 2023-01-24 ENCOUNTER — Other Ambulatory Visit: Payer: Self-pay | Admitting: Cardiology

## 2023-01-24 DIAGNOSIS — I2510 Atherosclerotic heart disease of native coronary artery without angina pectoris: Secondary | ICD-10-CM

## 2023-02-10 ENCOUNTER — Other Ambulatory Visit: Payer: Self-pay

## 2023-02-10 MED ORDER — EZETIMIBE 10 MG PO TABS: 10.0000 mg | ORAL_TABLET | Freq: Every day | ORAL | 0 refills | Status: DC

## 2023-02-24 ENCOUNTER — Ambulatory Visit: Payer: Medicare HMO | Admitting: Cardiology

## 2023-02-24 ENCOUNTER — Ambulatory Visit: Payer: Medicare HMO | Attending: Cardiology | Admitting: Cardiology

## 2023-02-24 NOTE — Progress Notes (Deleted)
  Cardiology Office Note:   Date:  02/24/2023  ID:  Jesse Woods, DOB 1950-11-13, MRN [id number] PCP: Corwin Levins, MD  [facility] Providers Cardiologist:  Armanda Magic, MD { Click to update primary MD,subspecialty MD or APP then REFRESH:1}   History of Present Illness:   Jesse Woods is a 72 y.o. male ***  Discussed the use of AI scribe software for clinical note transcription with the patient, who gave verbal consent to proceed.  History of Present Illness             Today patient denies chest pain, shortness of breath, lower extremity edema, fatigue, palpitations, melena, hematuria, hemoptysis, diaphoresis, weakness, presyncope, syncope, orthopnea, and PND.   Studies Reviewed:    EKG:        ***  Risk Assessment/Calculations:   {Does this patient have ATRIAL FIBRILLATION?:[phone]} No BP recorded.  {Refresh Note OR Click here to enter BP  :1}***        Physical Exam:   VS:  There were no vitals taken for this visit.   Wt Readings from Last 3 Encounters:  11/26/22 179 lb 9.6 oz (81.5 kg)  07/04/22 180 lb (81.6 kg)  01/04/22 177 lb (80.3 kg)     Physical Exam  Physical Exam           ASSESSMENT AND PLAN:     Assessment and Plan                  {Are you ordering a CV Procedure (e.g. stress test, cath, DCCV, TEE, etc)?   Press F2        :[id number]}   Signed, Perlie Gold, PA-C

## 2023-02-25 ENCOUNTER — Encounter: Payer: Self-pay | Admitting: Cardiology

## 2023-02-26 ENCOUNTER — Telehealth: Payer: Self-pay | Admitting: Cardiology

## 2023-02-26 ENCOUNTER — Other Ambulatory Visit: Payer: Self-pay | Admitting: Cardiology

## 2023-02-26 DIAGNOSIS — I2510 Atherosclerotic heart disease of native coronary artery without angina pectoris: Secondary | ICD-10-CM

## 2023-02-26 MED ORDER — ROSUVASTATIN CALCIUM 40 MG PO TABS: ORAL_TABLET | ORAL | 0 refills | Status: DC

## 2023-02-26 MED ORDER — SPIRONOLACTONE 25 MG PO TABS: ORAL_TABLET | ORAL | 0 refills | Status: DC

## 2023-02-26 NOTE — Telephone Encounter (Signed)
*  STAT* If patient is at the pharmacy, call can be transferred to refill team.   1. Which medications need to be refilled? (please list name of each medication and dose if known) rosuvastatin (CRESTOR) 40 MG tablet   2. Which pharmacy/location (including street and city if local pharmacy) is medication to be sent to?  [id number] - [place], [place] - [address] AT [address]    3. Do they need a 30 day or 90 day supply? 30

## 2023-02-26 NOTE — Telephone Encounter (Signed)
Refills faxed to the patient's pharmacy.

## 2023-02-26 NOTE — Telephone Encounter (Signed)
*  STAT* If patient is at the pharmacy, call can be transferred to refill team.   1. Which medications need to be refilled? (please list name of each medication and dose if known)   spironolactone (ALDACTONE) 25 MG tablet    2. Which pharmacy/location (including street and city if local pharmacy) is medication to be sent to?[id number] - [place], [place] - [address] AT [address]   3. Do they need a 30 day or 90 day supply? 90 day

## 2023-02-27 ENCOUNTER — Encounter: Payer: Self-pay | Admitting: Cardiology

## 2023-02-27 ENCOUNTER — Ambulatory Visit: Payer: Medicare HMO | Attending: Cardiology | Admitting: Cardiology

## 2023-02-27 VITALS — BP 124/72 | HR 79 | Ht 68.0 in | Wt 178.8 lb

## 2023-02-27 DIAGNOSIS — I5032 Chronic diastolic (congestive) heart failure: Secondary | ICD-10-CM | POA: Diagnosis not present

## 2023-02-27 DIAGNOSIS — E785 Hyperlipidemia, unspecified: Secondary | ICD-10-CM | POA: Diagnosis not present

## 2023-02-27 DIAGNOSIS — I255 Ischemic cardiomyopathy: Secondary | ICD-10-CM | POA: Diagnosis not present

## 2023-02-27 DIAGNOSIS — I2510 Atherosclerotic heart disease of native coronary artery without angina pectoris: Secondary | ICD-10-CM | POA: Diagnosis not present

## 2023-02-27 DIAGNOSIS — I10 Essential (primary) hypertension: Secondary | ICD-10-CM | POA: Diagnosis not present

## 2023-02-27 MED ORDER — SPIRONOLACTONE 25 MG PO TABS: ORAL_TABLET | ORAL | 3 refills | Status: DC

## 2023-02-27 MED ORDER — ROSUVASTATIN CALCIUM 40 MG PO TABS: ORAL_TABLET | ORAL | 3 refills | Status: DC

## 2023-02-27 MED ORDER — ISOSORBIDE MONONITRATE ER 30 MG PO TB24: ORAL_TABLET | ORAL | 3 refills | Status: DC

## 2023-02-27 MED ORDER — NITROGLYCERIN 0.4 MG SL SUBL: 0.4000 mg | SUBLINGUAL_TABLET | SUBLINGUAL | 3 refills | Status: AC | PRN

## 2023-02-27 MED ORDER — LISINOPRIL 40 MG PO TABS: 40.0000 mg | ORAL_TABLET | Freq: Every day | ORAL | 3 refills | Status: DC

## 2023-02-27 MED ORDER — FUROSEMIDE 20 MG PO TABS: 20.0000 mg | ORAL_TABLET | ORAL | 3 refills | Status: AC | PRN

## 2023-02-27 MED ORDER — EZETIMIBE 10 MG PO TABS: 10.0000 mg | ORAL_TABLET | Freq: Every day | ORAL | 3 refills | Status: DC

## 2023-02-27 MED ORDER — CLOPIDOGREL BISULFATE 75 MG PO TABS: 75.0000 mg | ORAL_TABLET | Freq: Every day | ORAL | 2 refills | Status: DC

## 2023-02-27 MED ORDER — FARXIGA 10 MG PO TABS: 10.0000 mg | ORAL_TABLET | Freq: Every day | ORAL | 3 refills | Status: AC

## 2023-02-27 MED ORDER — CARVEDILOL 12.5 MG PO TABS: ORAL_TABLET | ORAL | 3 refills | Status: DC

## 2023-02-27 NOTE — Patient Instructions (Addendum)
Medication Instructions:  Your physician recommends that you continue on your current medications as directed. Please refer to the Current Medication list given to you today.  *If you need a refill on your cardiac medications before your next appointment, please call your pharmacy*  Lab Work: TODAY: CMET and FLP  If you have labs (blood work) drawn today and your tests are completely normal, you will receive your results only by: MyChart Message (if you have MyChart) OR A paper copy in the mail If you have any lab test that is abnormal or we need to change your treatment, we will call you to review the results.  Follow-Up: At [facility], you and your health needs are our priority.  As part of our continuing mission to provide you with exceptional heart care, we have created designated Provider Care Teams.  These Care Teams include your primary Cardiologist (physician) and Advanced Practice Providers (APPs -  Physician Assistants and Nurse Practitioners) who all work together to provide you with the care you need, when you need it.  Your next appointment:   1 year  Provider:   Dr. Armanda Magic

## 2023-02-27 NOTE — Progress Notes (Signed)
Date:  02/27/2023   ID:  Jesse Woods, DOB [birth year], MRN [id number]  PCP:  Corwin Levins, MD  Cardiologist:  Armanda Magic, MD  Electrophysiologist:  None   Chief Complaint:  CAD, HTN, HLD, CHF  History of Present Illness:    Jesse Woods is a 72 y.o. male  with a hx of HTN, HL, DM2, remote history of subarachnoid hemorrhage, CAD and ischemic cardiomyopathy. He was noted to have chronic total occlusion of mid LCx and severely diseased RCA on previous cath in February 2016. EF at that time was 15%.  Echo 8/16 showed EF had improved to 35-40%.   LHC 12/22/2014 showed 90% proximal to mid RCA lesion that treated with 3.0 x 32 mm Synergy DES. He had residual chronic total occlusion of mid left circumflex with left to left collaterals.  His ACE I was changed to [facility] but unfortunately, his insurance did not cover Entresto and it was too expensive and changed to ACE I.His last echo in 2019 showed normalization of EF at 60%.   He is here today for followup and is doing well.  He denies any chest pain or pressure, SOB, DOE, PND, orthopnea, LE edema, dizziness, palpitations or syncope. He is compliant with his meds and is tolerating meds with no SE.    Prior CV studies:   The following studies were reviewed today:  none  Past Medical History:  Diagnosis Date   Arthritis    "back and left hip" (12/22/2014)   Chronic diastolic (congestive) heart failure (HCC)    Coronary artery disease 01/2015   chronic total occlusion of mid LCx and severely diseased RCA on previous cath in February 2016. EF at that time was 15%. He was cleared by neurosurgery for DAPT and then was seen by Dr. Eldridge Dace to proceed with PCI of RCA.  LHC 12/22/2014 showed 90% proximal to mid RCA lesion that treated with 3.0 x 32 mm Synergy DES   DCM (dilated cardiomyopathy) (HCC) 06/22/2014   EF was 15% but now 35-40% after PCI. Echo 2019 with EF 60-65% with G1DD   Diabetes mellitus, type II (HCC)    Hyperlipidemia     Hypertension    Subarachnoid hemorrhage (HCC) 1998   from HTN   Past Surgical History:  Procedure Laterality Date   BRAIN SURGERY  1998   right frontal ventriculotomy with subsequent ventriculo-abfdominal shunt due to [facility]   CARDIAC CATHETERIZATION N/A 12/22/2014   Procedure: Coronary Stent Intervention;  Surgeon: Corky Crafts, MD;  Location: [facility] INVASIVE CV LAB;  Service: Cardiovascular;  Laterality: N/A;   CARDIAC CATHETERIZATION  12/22/2014   Procedure: Left Heart Cath and Coronary Angiography;  Surgeon: Corky Crafts, MD;  Location: [facility] INVASIVE CV LAB;  Service: Cardiovascular;;   CARDIAC CATHETERIZATION  06/24/2014   CORONARY ANGIOPLASTY     CSF SHUNT  1998   INGUINAL HERNIA REPAIR Right 1954   LEFT HEART CATHETERIZATION WITH CORONARY ANGIOGRAM N/A 06/24/2014   Procedure: LEFT HEART CATHETERIZATION WITH CORONARY ANGIOGRAM;  Surgeon: Corky Crafts, MD;  Location: [facility] CATH LAB;  Service: Cardiovascular;  Laterality: N/A;     Current Meds  Medication Sig   aspirin 81 MG tablet Take 81 mg by mouth daily.   carvedilol (COREG) 12.5 MG tablet TAKE 1 TABLET BY MOUTH TWICE DAILY. Please keep scheduled appointment for future refills. Thank you.   clopidogrel (PLAVIX) 75 MG tablet TAKE 1 TABLET(75 MG) BY MOUTH DAILY   ezetimibe (ZETIA) 10 MG tablet  Take 1 tablet (10 mg total) by mouth daily. Please keep scheduled appointment for future refills. Thank you.   FARXIGA 10 MG TABS tablet Take 10 mg by mouth daily.   isosorbide mononitrate (IMDUR) 30 MG 24 hr tablet TAKE 1 TABLET(30 MG) BY MOUTH DAILY. Marland Kitchen   lisinopril (ZESTRIL) 40 MG tablet TAKE 1 TABLET(40 MG) BY MOUTH DAILY   LOKELMA 10 g PACK packet SMARTSIG:1 Packet(s) By Mouth Every Other Day   metFORMIN (GLUCOPHAGE) 1000 MG tablet Take 1 tablet (1,000 mg total) by mouth 2 (two) times daily with a meal.   Multiple Vitamins-Minerals (MULTIVITAMIN WITH MINERALS) tablet Take 1 tablet by mouth daily.   nitroGLYCERIN (NITROSTAT) 0.4 MG  SL tablet Place 1 tablet (0.4 mg total) under the tongue every 5 (five) minutes as needed for chest pain (x 3 tabs).   rosuvastatin (CRESTOR) 40 MG tablet TAKE 1 TABLET(40 MG) BY MOUTH DAILY. Must keep scheduled appointment for future refills. Thank you.   spironolactone (ALDACTONE) 25 MG tablet TAKE 1/2 TABLET(12.5 MG) BY MOUTH DAILY.  Must keep scheduled appointment for future refills. Thank you.   tetrahydrozoline-zinc (VISINE-AC) 0.05-0.25 % ophthalmic solution Place 1 drop into both eyes daily as needed (allergy irritation.).     Allergies:   Patient has no known allergies.   Social History   Tobacco Use   Smoking status: Never   Smokeless tobacco: Never  Substance Use Topics   Alcohol use: Yes    Comment: 12/22/2014 "no alcohol since 1998"   Drug use: No     Family Hx: The patient's family history includes Cancer in his brother, cousin, father, and mother; Diabetes in his father; Hypertension in his father; Pancreatic cancer in his mother; Prostate cancer in his father and paternal uncle.  ROS:   Please see the history of present illness.     All other systems reviewed and are negative.   Labs/Other Tests and Data Reviewed:    EKG Interpretation Date/Time:  Thursday February 27 2023 09:29:50 EST Ventricular Rate:  69 PR Interval:  194 QRS Duration:  78 QT Interval:  396 QTC Calculation: 424 R Axis:   1  Text Interpretation: Normal sinus rhythm Increased R/S ratio in V1, consider early transition or posterior infarct When compared with ECG of 23-Dec-2014 09:08, PREVIOUS ECG IS PRESENT Confirmed by Armanda Magic ([id number]) on 02/27/2023 9:48:43 AM   No change from prior EKG  Recent Labs: 07/04/2022: ALT 59; BUN 24; Creatinine, Ser 2.25; Hemoglobin 13.2; Platelets 206.0; Potassium 5.7 No hemolysis seen; Sodium 138; TSH 1.19   Recent Lipid Panel Lab Results  Component Value Date/Time   CHOL 104 07/04/2022 03:01 PM   CHOL 103 03/25/2019 10:08 AM   TRIG 147.0 07/04/2022  03:01 PM   HDL 34.30 (L) 07/04/2022 03:01 PM   HDL 37 (L) 03/25/2019 10:08 AM   CHOLHDL 3 07/04/2022 03:01 PM   LDLCALC 40 07/04/2022 03:01 PM   LDLCALC 48 11/29/2019 10:12 AM   LDLDIRECT 215.3 01/03/2011 10:35 AM    Wt Readings from Last 3 Encounters:  02/27/23 178 lb 12.8 oz (81.1 kg)  11/26/22 179 lb 9.6 oz (81.5 kg)  07/04/22 180 lb (81.6 kg)     Objective:    Vital Signs:  BP 124/72   Pulse 79   Ht 5\' 8"  (1.727 m)   Wt 178 lb 12.8 oz (81.1 kg)   SpO2 98%   BMI 27.19 kg/m   GEN: Well nourished, well developed in no acute distress HEENT: Normal NECK:  No JVD; No carotid bruits LYMPHATICS: No lymphadenopathy CARDIAC:RRR, no murmurs, rubs, gallops RESPIRATORY:  Clear to auscultation without rales, wheezing or rhonchi  ABDOMEN: Soft, non-tender, non-distended MUSCULOSKELETAL:  No edema; No deformity  SKIN: Warm and dry NEUROLOGIC:  Alert and oriented x 3 PSYCHIATRIC:  Normal affect  ASSESSMENT & PLAN:    1.  ASCAD -cath showed chronic total occlusion of mid LCx and severely diseased RCA on cath in February 2016. EF at that time was 15%.   -FU echo in August 2016 showed EF had improved to 35-40%.   -LHC 12/22/2014 showed 90% proximal to mid RCA lesion that treated with 3.0 x 32 mm Synergy DES. He had residual chronic total occlusion of mid left circumflex with left to left collaterals.  -He did not have any anginal symptoms -Continue prescription drug management with aspirin 81 mg daily, carvedilol 12.5 mg twice daily, Plavix 75 mg daily, Imdur 30 mg daily and statin therapy with as needed refills  2.  Chronic diastolic  CHF -He appears euvolemic on exam today denies any lower extremity edema or shortness of breath -Continue prescription drug management with spironolactone 12.5 mg daily with as needed refills -continue [place] 10mg  daily with PRN refills  3.  Ischemic DCM -EF in 2016 was as low as 15% in Feb 2016  -EF improved to 35-40% on f/u echo Aug 2016.   -His  most recent echocardiogram April 2019 showed normalization of EF at 60 to 65%.  4.  HTN -BP adequately showed on exam today -Continue drug management with spironolactone 12.5 mg daily, lisinopril 40 mg daily, Imdur 30 mg daily and carvedilol 12.5 mg twice daily with as needed refills -Check bmet today  5.  HLD -LDL goal < 70 -I have personally reviewed and interpreted outside labs performed by patient's PCP which showed LDL 40, HDL 34 and ALT 59 0/86/5784 -Continue prescription management with Crestor 40 mg daily and Zetia 10 mg daily with as needed refills -Repeat FLP and ALT  Medication Adjustments/Labs and Tests Ordered: Current medicines are reviewed at length with the patient today.  Concerns regarding medicines are outlined above.  Tests Ordered: Orders Placed This Encounter  Procedures   EKG 12-Lead    Medication Changes: No orders of the defined types were placed in this encounter.    Disposition:  Follow up 1 year  Signed, Armanda Magic, MD  02/27/2023 9:47 AM    [facility]

## 2023-02-28 LAB — COMPREHENSIVE METABOLIC PANEL
ALT: 47 [IU]/L — ABNORMAL HIGH (ref 0–44)
AST: 50 [IU]/L — ABNORMAL HIGH (ref 0–40)
Albumin: 4.2 g/dL (ref 3.8–4.8)
Alkaline Phosphatase: 49 [IU]/L (ref 44–121)
BUN/Creatinine Ratio: 6 — ABNORMAL LOW (ref 10–24)
BUN: 11 mg/dL (ref 8–27)
Bilirubin Total: 0.6 mg/dL (ref 0.0–1.2)
CO2: 22 mmol/L (ref 20–29)
Calcium: 9.8 mg/dL (ref 8.6–10.2)
Chloride: 107 mmol/L — ABNORMAL HIGH (ref 96–106)
Creatinine, Ser: 1.72 mg/dL — ABNORMAL HIGH (ref 0.76–1.27)
Globulin, Total: 2.7 g/dL (ref 1.5–4.5)
Glucose: 96 mg/dL (ref 70–99)
Potassium: 5.1 mmol/L (ref 3.5–5.2)
Sodium: 144 mmol/L (ref 134–144)
Total Protein: 6.9 g/dL (ref 6.0–8.5)
eGFR: 41 mL/min/{1.73_m2} — ABNORMAL LOW (ref >59)

## 2023-02-28 LAB — LIPID PANEL
Chol/HDL Ratio: 2.9 ratio (ref 0.0–5.0)
Cholesterol, Total: 105 mg/dL (ref 100–199)
HDL: 36 mg/dL — ABNORMAL LOW (ref >39)
LDL Chol Calc (NIH): 49 mg/dL (ref 0–99)
Triglycerides: 110 mg/dL (ref 0–149)
VLDL Cholesterol Cal: 20 mg/dL (ref 5–40)

## 2023-04-08 ENCOUNTER — Other Ambulatory Visit: Payer: Self-pay | Admitting: Cardiology

## 2023-04-08 DIAGNOSIS — I2510 Atherosclerotic heart disease of native coronary artery without angina pectoris: Secondary | ICD-10-CM

## 2023-04-08 MED ORDER — EZETIMIBE 10 MG PO TABS: 10.0000 mg | ORAL_TABLET | Freq: Every day | ORAL | 3 refills | Status: DC

## 2023-06-21 ENCOUNTER — Other Ambulatory Visit: Payer: Self-pay | Admitting: Internal Medicine

## 2023-06-23 ENCOUNTER — Other Ambulatory Visit: Payer: Self-pay

## 2023-09-18 ENCOUNTER — Other Ambulatory Visit: Payer: Self-pay | Admitting: Cardiology

## 2023-09-18 DIAGNOSIS — I2510 Atherosclerotic heart disease of native coronary artery without angina pectoris: Secondary | ICD-10-CM

## 2023-09-23 ENCOUNTER — Telehealth: Payer: Self-pay | Admitting: Internal Medicine

## 2023-09-23 NOTE — Telephone Encounter (Signed)
 Copied from CRM [id number]. Topic: General - Other >> Sep 23, 2023  9:02 AM Mesmerise C wrote: Reason for CRM: [employer] Rep Bonnie Butters trying to confirm if patient has a chronic cardiovascular disease and diabetes call back at [phone] please use Reference #[id number] they need verification 6/30

## 2023-09-23 NOTE — Telephone Encounter (Signed)
 Yes pt has hx of CAD, and DM

## 2023-09-26 NOTE — Telephone Encounter (Signed)
 Called and gave confirmation to [facility].

## 2023-10-21 ENCOUNTER — Ambulatory Visit
Admission: EM | Admit: 2023-10-21 | Discharge: 2023-10-21 | Disposition: A | Discharge status: Home / Self Care | Attending: Family Medicine | Admitting: Family Medicine

## 2023-10-21 DIAGNOSIS — R519 Headache, unspecified: Secondary | ICD-10-CM | POA: Diagnosis not present

## 2023-10-21 DIAGNOSIS — H9312 Tinnitus, left ear: Secondary | ICD-10-CM | POA: Diagnosis not present

## 2023-10-21 NOTE — Discharge Instructions (Addendum)
 For the headache, you can take Tylenol  500 mg--2 tablets every 6 hours as needed for pain  Please follow-up with your primary care about the ringing in your ear and about the tingling in your feet.
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
## 2023-10-23 ENCOUNTER — Ambulatory Visit: Payer: Self-pay | Admitting: Internal Medicine

## 2023-10-23 ENCOUNTER — Ambulatory Visit: Admitting: Internal Medicine

## 2023-10-23 ENCOUNTER — Encounter: Payer: Self-pay | Admitting: Internal Medicine

## 2023-10-23 VITALS — BP 122/80 | HR 66 | Temp 97.7°F | Ht 68.0 in | Wt 171.4 lb

## 2023-10-23 DIAGNOSIS — E785 Hyperlipidemia, unspecified: Secondary | ICD-10-CM | POA: Diagnosis not present

## 2023-10-23 DIAGNOSIS — E1159 Type 2 diabetes mellitus with other circulatory complications: Secondary | ICD-10-CM | POA: Diagnosis not present

## 2023-10-23 DIAGNOSIS — Z125 Encounter for screening for malignant neoplasm of prostate: Secondary | ICD-10-CM

## 2023-10-23 DIAGNOSIS — N1831 Chronic kidney disease, stage 3a: Secondary | ICD-10-CM

## 2023-10-23 DIAGNOSIS — Z1211 Encounter for screening for malignant neoplasm of colon: Secondary | ICD-10-CM

## 2023-10-23 DIAGNOSIS — E559 Vitamin D deficiency, unspecified: Secondary | ICD-10-CM | POA: Diagnosis not present

## 2023-10-23 DIAGNOSIS — I10 Essential (primary) hypertension: Secondary | ICD-10-CM | POA: Diagnosis not present

## 2023-10-23 DIAGNOSIS — Z7984 Long term (current) use of oral hypoglycemic drugs: Secondary | ICD-10-CM

## 2023-10-23 DIAGNOSIS — Z0001 Encounter for general adult medical examination with abnormal findings: Secondary | ICD-10-CM

## 2023-10-23 DIAGNOSIS — Z0000 Encounter for general adult medical examination without abnormal findings: Secondary | ICD-10-CM

## 2023-10-23 DIAGNOSIS — E538 Deficiency of other specified B group vitamins: Secondary | ICD-10-CM

## 2023-10-23 LAB — URINALYSIS, ROUTINE W REFLEX MICROSCOPIC
Bilirubin Urine: NEGATIVE
Hgb urine dipstick: NEGATIVE
Ketones, ur: NEGATIVE
Leukocytes,Ua: NEGATIVE
Nitrite: NEGATIVE
RBC / HPF: NONE SEEN (ref 0–?)
Specific Gravity, Urine: 1.01 (ref 1.000–1.030)
Total Protein, Urine: NEGATIVE
Urine Glucose: >=1000 — AB
Urobilinogen, UA: 0.2 (ref 0.0–1.0)
pH: 6 (ref 5.0–8.0)

## 2023-10-23 LAB — VITAMIN D 25 HYDROXY (VIT D DEFICIENCY, FRACTURES): VITD: 49.15 ng/mL (ref 30.00–100.00)

## 2023-10-23 LAB — HEPATIC FUNCTION PANEL
ALT: 50 U/L (ref 0–53)
AST: 69 U/L — ABNORMAL HIGH (ref 0–37)
Albumin: 4.1 g/dL (ref 3.5–5.2)
Alkaline Phosphatase: 43 U/L (ref 39–117)
Bilirubin, Direct: 0.2 mg/dL (ref 0.0–0.3)
Total Bilirubin: 0.8 mg/dL (ref 0.2–1.2)
Total Protein: 7 g/dL (ref 6.0–8.3)

## 2023-10-23 LAB — VITAMIN B12: Vitamin B-12: 333 pg/mL (ref 211–911)

## 2023-10-23 LAB — BASIC METABOLIC PANEL WITH GFR
BUN: 14 mg/dL (ref 6–23)
CO2: 26 meq/L (ref 19–32)
Calcium: 9.2 mg/dL (ref 8.4–10.5)
Chloride: 104 meq/L (ref 96–112)
Creatinine, Ser: 1.92 mg/dL — ABNORMAL HIGH (ref 0.40–1.50)
GFR: 33.8 mL/min — ABNORMAL LOW (ref >60.00)
Glucose, Bld: 95 mg/dL (ref 70–99)
Potassium: 4.9 meq/L (ref 3.5–5.1)
Sodium: 138 meq/L (ref 135–145)

## 2023-10-23 LAB — LIPID PANEL
Cholesterol: 81 mg/dL (ref 0–200)
HDL: 33.9 mg/dL — ABNORMAL LOW (ref >39.00)
LDL Cholesterol: 26 mg/dL (ref 0–99)
NonHDL: 47.08
Total CHOL/HDL Ratio: 2
Triglycerides: 106 mg/dL (ref 0.0–149.0)
VLDL: 21.2 mg/dL (ref 0.0–40.0)

## 2023-10-23 LAB — PSA: PSA: 1.52 ng/mL (ref 0.10–4.00)

## 2023-10-23 LAB — MICROALBUMIN / CREATININE URINE RATIO
Creatinine,U: 125.3 mg/dL
Microalb Creat Ratio: 10.5 mg/g (ref 0.0–30.0)
Microalb, Ur: 1.3 mg/dL (ref 0.0–1.9)

## 2023-10-23 LAB — TSH: TSH: 1.24 u[IU]/mL (ref 0.35–5.50)

## 2023-10-23 MED ORDER — MELOXICAM 15 MG PO TABS
15.0000 mg | ORAL_TABLET | Freq: Every day | ORAL | 1 refills | Status: DC | PRN
Start: 2023-10-23 — End: 2024-01-19

## 2023-10-23 NOTE — Assessment & Plan Note (Signed)
 Lab Results  Component Value Date   HGBA1C 6.0 07/04/2022   Stable, pt to continue current medical treatment farxiga  10 every day, metformin  1000 bid

## 2023-10-23 NOTE — Assessment & Plan Note (Signed)
 Age and sex appropriate education and counseling updated with regular exercise and diet Referrals for preventative services - for colonoscopy, and eye exam referrals Immunizations addressed - none needed Smoking counseling  - none needed Evidence for depression or other mood disorder - none significant Most recent labs reviewed. I have personally reviewed and have noted: 1) the patient's medical and social history 2) The patient's current medications and supplements 3) The patient's height, weight, and BMI have been recorded in the chart

## 2023-10-23 NOTE — Assessment & Plan Note (Signed)
 Lab Results  Component Value Date   CREATININE 1.72 (H) 02/27/2023   Stable overall, cont to avoid nephrotoxins
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
## 2023-10-23 NOTE — Patient Instructions (Addendum)
 Please take all new medication as prescribed- the meloxicam  as needed for pain  Please see Sports Medicine on the first floor if you are not improved in 1 -2 wks  Please continue all other medications as before, and refills have been done if requested.  Please have the pharmacy call with any other refills you may need.  Please continue your efforts at being more active, low cholesterol diet, and weight control.  You are otherwise up to date with prevention measures today.  Please keep your appointments with your specialists as you may have planned  You will be contacted regarding the referral for: Eye doctor Dr Octavia, and colonoscopy  Please go to the LAB at the blood drawing area for the tests to be done  You will be contacted by phone if any changes need to be made immediately.  Otherwise, you will receive a letter about your results with an explanation, but please check with MyChart first.  Please make an Appointment to return in 6 months, or sooner if needed

## 2023-10-23 NOTE — Assessment & Plan Note (Signed)
 BP Readings from Last 3 Encounters:  10/23/23 122/80  10/21/23 115/77  02/27/23 124/72   Stable, pt to continue medical treatment coreg  12.5 bid, lisinopril  40 qd

## 2023-10-23 NOTE — Assessment & Plan Note (Signed)
 Lab Results  Component Value Date   LDLCALC 49 02/27/2023   Stable, pt to continue current statin zetia  10 mg every day, crestor  40 mg qd

## 2023-10-23 NOTE — Assessment & Plan Note (Signed)
Last vitamin D Lab Results  Component Value Date   VD25OH 35.81 07/04/2022   Low, to start oral replacement

## 2023-10-24 ENCOUNTER — Ambulatory Visit: Payer: Self-pay | Admitting: Internal Medicine

## 2023-10-24 ENCOUNTER — Other Ambulatory Visit (INDEPENDENT_AMBULATORY_CARE_PROVIDER_SITE_OTHER)

## 2023-10-24 ENCOUNTER — Other Ambulatory Visit: Payer: Self-pay | Admitting: Internal Medicine

## 2023-10-24 DIAGNOSIS — E1159 Type 2 diabetes mellitus with other circulatory complications: Secondary | ICD-10-CM | POA: Diagnosis not present

## 2023-10-24 LAB — CBC WITH DIFFERENTIAL/PLATELET
Basophils Absolute: 0 K/uL (ref 0.0–0.1)
Basophils Relative: 0.4 % (ref 0.0–3.0)
Eosinophils Absolute: 0.1 K/uL (ref 0.0–0.7)
Eosinophils Relative: 1.8 % (ref 0.0–5.0)
HCT: 35.6 % — ABNORMAL LOW (ref 39.0–52.0)
Hemoglobin: 11.7 g/dL — ABNORMAL LOW (ref 13.0–17.0)
Lymphocytes Relative: 25.7 % (ref 12.0–46.0)
Lymphs Abs: 1.4 K/uL (ref 0.7–4.0)
MCHC: 32.8 g/dL (ref 30.0–36.0)
MCV: 88.1 fl (ref 78.0–100.0)
Monocytes Absolute: 0.3 K/uL (ref 0.1–1.0)
Monocytes Relative: 6 % (ref 3.0–12.0)
Neutro Abs: 3.7 K/uL (ref 1.4–7.7)
Neutrophils Relative %: 66.1 % (ref 43.0–77.0)
Platelets: 186 K/uL (ref 150.0–400.0)
RBC: 4.05 Mil/uL — ABNORMAL LOW (ref 4.22–5.81)
RDW: 13.7 % (ref 11.5–15.5)
WBC: 5.6 K/uL (ref 4.0–10.5)

## 2023-10-24 LAB — HEMOGLOBIN A1C: Hgb A1c MFr Bld: 5.7 % (ref 4.6–6.5)

## 2023-10-28 ENCOUNTER — Other Ambulatory Visit: Payer: Self-pay

## 2023-10-28 MED ORDER — CARVEDILOL 12.5 MG PO TABS: 12.5000 mg | ORAL_TABLET | Freq: Two times a day (BID) | ORAL | 1 refills | Status: DC

## 2023-10-28 MED ORDER — SPIRONOLACTONE 25 MG PO TABS: ORAL_TABLET | ORAL | 1 refills | Status: DC

## 2023-10-28 MED ORDER — LISINOPRIL 40 MG PO TABS: 40.0000 mg | ORAL_TABLET | Freq: Every day | ORAL | 1 refills | Status: DC

## 2023-11-05 ENCOUNTER — Encounter: Payer: Self-pay | Admitting: Internal Medicine

## 2023-11-05 ENCOUNTER — Ambulatory Visit (INDEPENDENT_AMBULATORY_CARE_PROVIDER_SITE_OTHER): Admitting: Internal Medicine

## 2023-11-05 VITALS — BP 104/72 | HR 81 | Temp 98.2°F | Ht 68.0 in | Wt 171.0 lb

## 2023-11-05 DIAGNOSIS — H9312 Tinnitus, left ear: Secondary | ICD-10-CM | POA: Diagnosis not present

## 2023-11-05 DIAGNOSIS — E1159 Type 2 diabetes mellitus with other circulatory complications: Secondary | ICD-10-CM

## 2023-11-05 DIAGNOSIS — H6122 Impacted cerumen, left ear: Secondary | ICD-10-CM | POA: Diagnosis not present

## 2023-11-05 DIAGNOSIS — I10 Essential (primary) hypertension: Secondary | ICD-10-CM

## 2023-11-05 DIAGNOSIS — E559 Vitamin D deficiency, unspecified: Secondary | ICD-10-CM

## 2023-11-05 DIAGNOSIS — Z7984 Long term (current) use of oral hypoglycemic drugs: Secondary | ICD-10-CM

## 2023-11-05 NOTE — Assessment & Plan Note (Signed)
 Lab Results  Component Value Date   HGBA1C 5.7 10/24/2023   Stable, pt to continue current medical treatment farxiga  10 every day, metformin  1000 bid

## 2023-11-05 NOTE — Assessment & Plan Note (Signed)
 Last vitamin D  Lab Results  Component Value Date   VD25OH 49.15 10/23/2023   Stable, cont oral replacement

## 2023-11-05 NOTE — Progress Notes (Signed)
 Patient ID: Jesse Woods, male   DOB: 11-16-1950, 73 y.o.   MRN: [id number]        Chief Complaint: follow up left ear tinnitus, URI       HPI:  Jesse Woods is a 73 y.o. male , Here with 2-3 days acute onset fever, left ear pain, pressure, headache, general weakness and malaise, with mild ST and cough and left ear tinnitus unusual for him, but pt denies chest pain, wheezing, increased sob or doe, orthopnea, PND, increased LE swelling, palpitations, dizziness or syncope.   Pt denies polydipsia, polyuria, or new focal neuro s/s.          Wt Readings from Last 3 Encounters:  11/05/23 171 lb (77.6 kg)  10/23/23 171 lb 6.4 oz (77.7 kg)  10/21/23 178 lb 12.7 oz (81.1 kg)   BP Readings from Last 3 Encounters:  11/05/23 104/72  10/23/23 122/80  10/21/23 115/77         Past Medical History:  Diagnosis Date   Arthritis    back and left hip (12/22/2014)   Chronic diastolic (congestive) heart failure (HCC)    Coronary artery disease 01/2015   chronic total occlusion of mid LCx and severely diseased RCA on previous cath in February 2016. EF at that time was 15%. He was cleared by neurosurgery for DAPT and then was seen by Dr. Dann to proceed with PCI of RCA.  LHC 12/22/2014 showed 90% proximal to mid RCA lesion that treated with 3.0 x 32 mm Synergy DES   DCM (dilated cardiomyopathy) (HCC) 06/22/2014   EF was 15% but now 35-40% after PCI. Echo 2019 with EF 60-65% with G1DD   Diabetes mellitus, type II (HCC)    Hyperlipidemia    Hypertension    Subarachnoid hemorrhage (HCC) 1998   from HTN   Past Surgical History:  Procedure Laterality Date   BRAIN SURGERY  1998   right frontal ventriculotomy with subsequent ventriculo-abfdominal shunt due to [facility]   CARDIAC CATHETERIZATION N/A 12/22/2014   Procedure: Coronary Stent Intervention;  Surgeon: Candyce GORMAN Dann, MD;  Location: [facility] INVASIVE CV LAB;  Service: Cardiovascular;  Laterality: N/A;   CARDIAC CATHETERIZATION  12/22/2014    Procedure: Left Heart Cath and Coronary Angiography;  Surgeon: Candyce GORMAN Dann, MD;  Location: [facility] INVASIVE CV LAB;  Service: Cardiovascular;;   CARDIAC CATHETERIZATION  06/24/2014   CORONARY ANGIOPLASTY     CSF SHUNT  1998   INGUINAL HERNIA REPAIR Right 1954   LEFT HEART CATHETERIZATION WITH CORONARY ANGIOGRAM N/A 06/24/2014   Procedure: LEFT HEART CATHETERIZATION WITH CORONARY ANGIOGRAM;  Surgeon: Candyce GORMAN Dann, MD;  Location: [facility] CATH LAB;  Service: Cardiovascular;  Laterality: N/A;    reports that he has never smoked. He has never used smokeless tobacco. He reports current alcohol use. He reports that he does not use drugs. family history includes Cancer in his brother, cousin, father, and mother; Diabetes in his father; Hypertension in his father; Pancreatic cancer in his mother; Prostate cancer in his father and paternal uncle. No Known Allergies Current Outpatient Medications on File Prior to Visit  Medication Sig Dispense Refill   aspirin  81 MG tablet Take 81 mg by mouth daily.     carvedilol  (COREG ) 12.5 MG tablet Take 1 tablet (12.5 mg total) by mouth 2 (two) times daily. 180 tablet 1   clopidogrel  (PLAVIX ) 75 MG tablet TAKE 1 TABLET(75 MG) BY MOUTH DAILY 90 tablet 3   COMIRNATY syringe Inject 0.3 mLs into the  muscle once.     ezetimibe  (ZETIA ) 10 MG tablet Take 1 tablet (10 mg total) by mouth daily. 90 tablet 3   FARXIGA  10 MG TABS tablet Take 1 tablet (10 mg total) by mouth daily. 90 tablet 3   furosemide  (LASIX ) 20 MG tablet Take 1 tablet (20 mg total) by mouth as needed for edema. 45 tablet 3   isosorbide  mononitrate (IMDUR ) 30 MG 24 hr tablet TAKE 1 TABLET(30 MG) BY MOUTH DAILY. . 90 tablet 3   lisinopril  (ZESTRIL ) 40 MG tablet Take 1 tablet (40 mg total) by mouth daily. 90 tablet 1   LOKELMA 10 g PACK packet SMARTSIG:1 Packet(s) By Mouth Every Other Day     meloxicam  (MOBIC ) 15 MG tablet Take 1 tablet (15 mg total) by mouth daily as needed. 90 tablet 1   metFORMIN   (GLUCOPHAGE ) 1000 MG tablet TAKE 1 TABLET(1000 MG) BY MOUTH TWICE DAILY WITH A MEAL 180 tablet 3   Multiple Vitamins-Minerals (MULTIVITAMIN WITH MINERALS) tablet Take 1 tablet by mouth daily.     nitroGLYCERIN  (NITROSTAT ) 0.4 MG SL tablet Place 1 tablet (0.4 mg total) under the tongue every 5 (five) minutes as needed for chest pain (x 3 tabs). 60 tablet 3   rosuvastatin  (CRESTOR ) 40 MG tablet TAKE 1 TABLET(40 MG) BY MOUTH DAILY 90 tablet 1   spironolactone  (ALDACTONE ) 25 MG tablet TAKE 1/2 TABLET(12.5 MG) BY MOUTH DAILY. 45 tablet 1   tetrahydrozoline-zinc (VISINE-AC) 0.05-0.25 % ophthalmic solution Place 1 drop into both eyes daily as needed (allergy irritation.).     VELTASSA 8.4 g packet Take 8.4 g by mouth daily.     No current facility-administered medications on file prior to visit.        ROS:  All others reviewed and negative.  Objective        PE:  BP 104/72   Pulse 81   Temp 98.2 F (36.8 C)   Ht 5' 8 (1.727 m)   Wt 171 lb (77.6 kg)   SpO2 97%   BMI 26.00 kg/m                 Constitutional: Pt appears in NAD               HENT: Head: NCAT.                Right Ear: External ear normal.                 Left Ear: External ear normal. Left canal with cerumen impaction resolved, right tm's with mild to mod erythema.  Max sinus areas mild tender.  Pharynx with mild erythema, no exudate               Eyes: . Pupils are equal, round, and reactive to light. Conjunctivae and EOM are normal               Nose: without d/c or deformity               Neck: Neck supple. Gross normal ROM               Cardiovascular: Normal rate and regular rhythm.                 Pulmonary/Chest: Effort normal and breath sounds without rales or wheezing.  Neurological: Pt is alert. At baseline orientation, motor grossly intact               Skin: Skin is warm. No rashes, no other new lesions, LE edema - none               Psychiatric: Pt behavior is normal without  agitation   Micro: none  Cardiac tracings I have personally interpreted today:  none  Pertinent Radiological findings (summarize): none   Lab Results  Component Value Date   WBC 5.6 10/24/2023   HGB 11.7 (L) 10/24/2023   HCT 35.6 (L) 10/24/2023   PLT 186.0 10/24/2023   GLUCOSE 95 10/23/2023   CHOL 81 10/23/2023   TRIG 106.0 10/23/2023   HDL 33.90 (L) 10/23/2023   LDLDIRECT 215.3 01/03/2011   LDLCALC 26 10/23/2023   ALT 50 10/23/2023   AST 69 (H) 10/23/2023   NA 138 10/23/2023   K 4.9 10/23/2023   CL 104 10/23/2023   CREATININE 1.92 (H) 10/23/2023   BUN 14 10/23/2023   CO2 26 10/23/2023   TSH 1.24 10/23/2023   PSA 1.52 10/23/2023   INR 1.1 (H) 12/19/2014   HGBA1C 5.7 10/24/2023   MICROALBUR 1.3 10/23/2023   Assessment/Plan:  JONTAVIUS RABALAIS is a 73 y.o. Black or African American [2] male with  has a past medical history of Arthritis, Chronic diastolic (congestive) heart failure (HCC), Coronary artery disease (01/2015), DCM (dilated cardiomyopathy) (HCC) (06/22/2014), Diabetes mellitus, type II (HCC), Hyperlipidemia, Hypertension, and Subarachnoid hemorrhage (HCC) (1998).  Vitamin D  deficiency Last vitamin D  Lab Results  Component Value Date   VD25OH 49.15 10/23/2023   Stable, cont oral replacement   Hearing loss of left ear due to cerumen impaction Improved with irrigation,  to f/u any worsening symptoms or concerns  Diabetes mellitus (HCC) Lab Results  Component Value Date   HGBA1C 5.7 10/24/2023   Stable, pt to continue current medical treatment farxiga  10 every day, metformin  1000 bid   Left-sided tinnitus Persistent, etiology unclear, pt requests ENT referral  Essential hypertension BP Readings from Last 3 Encounters:  11/05/23 104/72  10/23/23 122/80  10/21/23 115/77   Stable, pt to continue medical treatment coreg  12.5 bid  Followup: Return if symptoms worsen or fail to improve.  Lynwood Rush, MD 11/05/2023 8:19 PM [facility]

## 2023-11-05 NOTE — Assessment & Plan Note (Signed)
 Persistent, etiology unclear, pt requests ENT referral

## 2023-11-05 NOTE — Assessment & Plan Note (Addendum)
Improved with irrigation,  to f/u any worsening symptoms or concerns  

## 2023-11-05 NOTE — Patient Instructions (Signed)
 Your left ear was irrigated today of wax  Please continue all other medications as before, and refills have been done if requested.  Please have the pharmacy call with any other refills you may need.  Please keep your appointments with your specialists as you may have planned  You will be contacted regarding the referral for: ENT

## 2023-11-05 NOTE — Assessment & Plan Note (Signed)
 BP Readings from Last 3 Encounters:  11/05/23 104/72  10/23/23 122/80  10/21/23 115/77   Stable, pt to continue medical treatment coreg  12.5 bid

## 2023-11-12 ENCOUNTER — Telehealth: Payer: Self-pay | Admitting: Internal Medicine

## 2023-11-12 NOTE — Telephone Encounter (Unsigned)
 Copied from CRM #[id number]. Topic: Clinical - Prescription Issue >> Nov 11, 2023  4:56 PM Charolett L wrote: Reason for CRM: Rosaline from [employer] stated that the wrong brand was sent to the them for the  dexcom not the Libra 3plus... New script needs to be sent via Fax# [phone] She stated that she'll be sending a new form to be filled out

## 2023-11-14 NOTE — Telephone Encounter (Signed)
 Has been signed and faxed back with confirmation

## 2023-11-26 NOTE — Telephone Encounter (Unsigned)
 Copied from CRM #[id number]. Topic: Referral - Question >> Nov 26, 2023 12:48 PM Frederich PARAS wrote: Reason for CRM: brenisha from  [facility] , she says she received prescription for glucose monitor, however need the patient most recent clinical notes faxed over [phone]

## 2023-12-01 NOTE — Telephone Encounter (Signed)
 Clinical documentation for last appointment as well as most recent lab results have been faxed to number provided with confirmation.

## 2023-12-13 ENCOUNTER — Other Ambulatory Visit: Payer: Self-pay | Admitting: Cardiology

## 2024-01-19 ENCOUNTER — Other Ambulatory Visit: Payer: Self-pay | Admitting: Internal Medicine

## 2024-01-19 MED ORDER — TRUE METRIX BLOOD GLUCOSE TEST VI STRP: ORAL_STRIP | 12 refills | Status: AC

## 2024-01-19 MED ORDER — BD SWAB SINGLE USE REGULAR PADS: MEDICATED_PAD | 3 refills | Status: AC

## 2024-01-19 MED ORDER — TRUE METRIX AIR GLUCOSE METER DEVI: 0 refills | Status: AC

## 2024-01-19 MED ORDER — TRUE METRIX LEVEL 3 HIGH VI SOLN: 3 refills | Status: AC

## 2024-01-19 MED ORDER — MELOXICAM 15 MG PO TABS: 15.0000 mg | ORAL_TABLET | Freq: Every day | ORAL | 3 refills | Status: AC | PRN

## 2024-01-19 MED ORDER — METFORMIN HCL 1000 MG PO TABS: 1000.0000 mg | ORAL_TABLET | Freq: Two times a day (BID) | ORAL | 3 refills | Status: DC

## 2024-01-19 MED ORDER — TRUEPLUS LANCETS 30G MISC: 3 refills | Status: AC

## 2024-02-17 ENCOUNTER — Other Ambulatory Visit: Payer: Self-pay | Admitting: Cardiology

## 2024-02-21 ENCOUNTER — Other Ambulatory Visit: Payer: Self-pay | Admitting: Internal Medicine

## 2024-02-23 ENCOUNTER — Other Ambulatory Visit: Payer: Self-pay | Admitting: Cardiology

## 2024-02-26 ENCOUNTER — Other Ambulatory Visit: Payer: Self-pay | Admitting: Cardiology

## 2024-03-12 ENCOUNTER — Other Ambulatory Visit: Payer: Self-pay | Admitting: Cardiology

## 2024-03-15 ENCOUNTER — Other Ambulatory Visit: Payer: Self-pay | Admitting: Cardiology

## 2024-03-18 MED ORDER — SPIRONOLACTONE 25 MG PO TABS: ORAL_TABLET | ORAL | 0 refills | Status: AC

## 2024-03-24 ENCOUNTER — Other Ambulatory Visit: Payer: Self-pay | Admitting: Cardiology

## 2024-03-27 ENCOUNTER — Other Ambulatory Visit: Payer: Self-pay | Admitting: Cardiology

## 2024-03-29 ENCOUNTER — Other Ambulatory Visit: Payer: Self-pay | Admitting: Cardiology

## 2024-03-29 NOTE — Progress Notes (Unsigned)
 "  Cardiology Office Note    Date:  03/30/2024  ID:  Jesse, Woods October 06, 1950, MRN [id number] PCP:  Jesse Woods ORN, MD  Cardiologist:  Jesse Bihari, MD  Electrophysiologist:  None   Chief Complaint: Follow up for CAD  History of Present Illness: .   Jesse Woods is a 73 y.o. male with visit-pertinent history of hypertension, hyperlipidemia, type II DM, remote history of subarachnoid hemorrhage, CAD and ischemic cardiomyopathy.  During cardiac catheterization in February 2016 he was noted to have chronic total occlusion of mid LCx and severely diseased RCA, EF at that time was 15%.  Echo in 11/2014 showed EF had improved to 35 to 40%.  LHC on 12/22/2014 showed 90% proximal to mid RCA lesion treated with 3.0 x 32 mm Synergy DES.  Patient had residual chronic total occlusion of mid left circumflex with left to left collaterals.  GDMT previously titrated however Entresto  was not covered by his insurance and was changed to ACE.  Echocardiogram in 2019 showed normalization of EF at 60%.  Patient was last seen in clinic by Dr. Bihari on 02/27/2023 for follow-up.  He had remained stable from a cardiac standpoint.  Today he presents for follow-up.  He reports that he has been doing well overall.  He denies chest pain, shortness of breath, lower extremity edema, orthopnea or PND.  He denies any palpitations, presyncope or syncope.  Patient notes that he has recently not been regularly exercising however plans to start walking more.  He denies any cardiac concerns or complaints today. ROS: .   Today he denies chest pain, shortness of breath, lower extremity edema, fatigue, palpitations, melena, hematuria, hemoptysis, diaphoresis, weakness, presyncope, syncope, orthopnea, and PND.  All other systems are reviewed and otherwise negative. Studies Reviewed: SABRA   EKG:  EKG is ordered today, personally reviewed, demonstrating  EKG Interpretation Date/Time:  Tuesday March 30 2024 13:55:26  EST Ventricular Rate:  67 PR Interval:  178 QRS Duration:  76 QT Interval:  386 QTC Calculation: 407 R Axis:   -6  Text Interpretation: Normal sinus rhythm T wave abnormality, consider lateral ischemia EARLY R WAVE PROGRESSION When compared with ECG of 27-Feb-2023 09:29, No significant change was found Confirmed by Jesse Woods [phone]) on 03/30/2024 1:57:06 PM   CV Studies: Cardiac studies reviewed are outlined and summarized above. Otherwise please see EMR for full report. Cardiac Studies & Procedures   ______________________________________________________________________________________________ CARDIAC CATHETERIZATION  CARDIAC CATHETERIZATION 12/22/2014  Conclusion  Prox RCA to Mid RCA lesion, 90% stenosed. There is a 0% residual stenosis post intervention with a 3.0 x 32 Synergy drug-eluting stent, postdilated to 3.6 mm in diameter..  Chronic total occlusion of the mid circumflex with left to left collaterals.  Normal LVEDP. 5 mm Hg.  Continue dual antiplatelet therapy for at least a year ideally without interruption. If he does have any bleeding issues, particularly from his prior subarachnoid hemorrhage, would have to reconsider his dual antiplatelet therapy, but he would need this for at least 3 months. Given how long his stent is, I would prefer that he take dual antiplatelet therapy for the full year.  If his ejection fraction does not improve, would consider bringing him back for CTO PCI of the mid circumflex. This potentially would help his ejection fraction improved. Isosorbide  has been started. If his blood pressure will tolerate, will add hydralazine . Hopefully, that will help with heart failure as well. We'll hold diuretics at this point due to the normal LVEDP. They  may need to be restarted at a later time.  Cardiology follow-up with Dr. Shlomo.  Findings Coronary Findings Diagnostic  Dominance: Right  Left Anterior Descending  First Diagonal Branch The vessel is  small in size.  Left Circumflex The lesion is type C chronic total occlusion and has left-to-left collateral flow.  Second Obtuse Marginal Branch Collaterals 2nd Mrg filled by collaterals from [facility].  Right Coronary Artery The lesion is type C diffuse located at the bend and has left-to-left collateral flow.  Right Posterior Atrioventricular Artery The vessel is small in size.  Intervention  Prox RCA to Mid RCA lesion PCI The pre-interventional distal flow is normal (TIMI 3). Pre-stent angioplasty was performed. A drug-eluting stent was placed. The strut is apposed. Post-stent angioplasty was performed. Maximum pressure: 16 atm. The post-interventional distal flow is normal (TIMI 3). The intervention was successful. No complications occurred at this lesion. Supplies used: STENT SYNERGY DES 3X32; B[facility]  [place] EMERGE MR 3.5X20 There is a 0% residual stenosis post intervention.     ECHOCARDIOGRAM  ECHOCARDIOGRAM COMPLETE 07/30/2017  Narrative *[facility]* [address] [place], [place] [address] [phone]  ------------------------------------------------------------------- Transthoracic Echocardiography  Patient:    Jesse, Woods MR #:       [id number] Study Date: 07/30/2017 Gender:     M Age:        59 Height:     172.7 cm Weight:     89.4 kg BSA:        2.09 m^2 Pt. Status: Room:  ORDERING     Jesse Shlomo, MD REFERRING    Jesse Shlomo, MD ATTENDING    Jesse Woods, M.D. SONOGRAPHER  [facility] [place], [facility] PERFORMING   [facility], Outpatient  cc:  ------------------------------------------------------------------- LV EF: 60% -   65%  ------------------------------------------------------------------- Indications:      Ischemic cardiomyopathy (I25.5).  ------------------------------------------------------------------- History:   PMH:   Coronary artery disease.  Risk factors: Hypertension. Diabetes mellitus.  Dyslipidemia.  ------------------------------------------------------------------- Study Conclusions  - Left ventricle: The cavity size was normal. Wall thickness was increased in a pattern of mild LVH. Systolic function was normal. The estimated ejection fraction was in the range of 60% to 65%. Wall motion was normal; there were no regional wall motion abnormalities. Doppler parameters are consistent with abnormal left ventricular relaxation (grade 1 diastolic dysfunction). - Aortic valve: There was trivial regurgitation. - Pulmonary arteries: Systolic pressure was mildly increased. PA peak pressure: 32 mm Hg (S).  ------------------------------------------------------------------- Study data:  Comparison was made to the study of 02/05/2016.  Study status:  Routine.  Procedure:  The patient reported no pain pre or post test. Transthoracic echocardiography. Image quality was adequate.          Transthoracic echocardiography. M-mode, complete 2D,3D, spectral Doppler, and color Doppler.  Birthdate:  Patient birthdate: September 08, 1950.  Age:  Patient is 73 yr old.  Sex:  Gender: male.    BMI: 30 kg/m^2.  Blood pressure:     122/70  Patient status:  Outpatient.  Study date:  Study date: 07/30/2017. Study time: 12:50 PM.  Location:  [facility]  -------------------------------------------------------------------  ------------------------------------------------------------------- Left ventricle:  The global longitudinal strain is -15.8%. The cavity size was normal. Wall thickness was increased in a pattern of mild LVH. Systolic function was normal. The estimated ejection fraction was in the range of 60% to 65%. Wall motion was normal; there were no regional wall motion abnormalities. Doppler parameters are consistent with abnormal left ventricular relaxation (grade 1 diastolic dysfunction).  -------------------------------------------------------------------  Aortic valve:    Structurally normal valve.   Cusp separation was normal.  Doppler:  Transvalvular velocity was within the normal range. There was no stenosis. There was trivial regurgitation.  ------------------------------------------------------------------- Aorta:  Aortic root: The aortic root was normal in size. Ascending aorta: The ascending aorta was normal in size.  ------------------------------------------------------------------- Mitral valve:   Structurally normal valve.   Leaflet separation was normal.  Doppler:  Transvalvular velocity was within the normal range. There was no evidence for stenosis. There was no regurgitation.    Peak gradient (D): 3 mm Hg.  ------------------------------------------------------------------- Left atrium:  The atrium was normal in size.  ------------------------------------------------------------------- Right ventricle:  The cavity size was normal. Systolic function was normal.  ------------------------------------------------------------------- Pulmonic valve:    The valve appears to be grossly normal. Doppler:  There was no significant regurgitation.  ------------------------------------------------------------------- Tricuspid valve:   Structurally normal valve.   Leaflet separation was normal.  Doppler:  Transvalvular velocity was within the normal range. There was trivial regurgitation.  ------------------------------------------------------------------- Pulmonary artery:   Systolic pressure was mildly increased.  ------------------------------------------------------------------- Right atrium:  The atrium was normal in size.  ------------------------------------------------------------------- Pericardium:  There was no pericardial effusion.  ------------------------------------------------------------------- Systemic veins: Inferior vena cava: The vessel was normal in size. The respirophasic diameter changes were in the normal range (=  50%), consistent with normal central venous pressure. Diameter: 13 mm.  ------------------------------------------------------------------- Measurements  IVC                                        Value        Reference ID                                         13    mm     ----------  Left ventricle                             Value        Reference LV ID, ED, PLAX chordal             (L)    42.2  mm     43 - 52 LV ID, ES, PLAX chordal                    37.8  mm     23 - 38 LV fx shortening, PLAX chordal      (L)    10    %      >=29 LV PW thickness, ED                        15.2  mm     ---------- IVS/LV PW ratio, ED                        0.78         <=1.3 Stroke volume, 2D                          124   ml     ---------- Stroke volume/bsa, 2D  59    ml/m^2 ---------- LV e&', lateral                             6.74  cm/s   ---------- LV E/e&', lateral                           12.8         ---------- LV e&', medial                              5.22  cm/s   ---------- LV E/e&', medial                            16.53        ---------- LV e&', average                             5.98  cm/s   ---------- LV E/e&', average                           14.43        ---------- Longitudinal strain, TDI                   16    %      ----------  Ventricular septum                         Value        Reference IVS thickness, ED                          11.8  mm     ----------  LVOT                                       Value        Reference LVOT ID, S                                 22    mm     ---------- LVOT area                                  3.8   cm^2   ---------- LVOT peak velocity, S                      101   cm/s   ---------- LVOT mean velocity, S                      70.6  cm/s   ---------- LVOT VTI, S                                32.6  cm     ----------  Aorta  Value        Reference Aortic root ID, ED                          36    mm     ----------  Left atrium                                Value        Reference LA ID, A-P, ES                             42    mm     ---------- LA ID/bsa, A-P                             2     cm/m^2 <=2.2 LA volume, S                               52.9  ml     ---------- LA volume/bsa, S                           25.3  ml/m^2 ---------- LA volume, ES, 1-p A4C                     44.6  ml     ---------- LA volume/bsa, ES, 1-p A4C                 21.3  ml/m^2 ---------- LA volume, ES, 1-p A2C                     60.6  ml     ---------- LA volume/bsa, ES, 1-p A2C                 28.9  ml/m^2 ----------  Mitral valve                               Value        Reference Mitral E-wave peak velocity                86.3  cm/s   ---------- Mitral A-wave peak velocity                123   cm/s   ---------- Mitral deceleration time            (H)    264   ms     150 - 230 Mitral peak gradient, D                    3     mm Hg  ---------- Mitral E/A ratio, peak                     0.7          ----------  Pulmonary arteries                         Value        Reference PA pressure, S, DP                  (  H)    32    mm Hg  <=30  Tricuspid valve                            Value        Reference Tricuspid regurg peak velocity             270   cm/s   ---------- Tricuspid peak RV-RA gradient              29    mm Hg  ---------- Tricuspid maximal regurg velocity,         270   cm/s   ---------- PISA  Right atrium                               Value        Reference RA ID, S-I, ES, A4C                 (H)    54.7  mm     34 - 49 RA area, ES, A4C                           15.4  cm^2   8.3 - 19.5 RA volume, ES, A/L                         36.1  ml     ---------- RA volume/bsa, ES, A/L                     17.2  ml/m^2 ----------  Systemic veins                             Value        Reference Estimated CVP                              3     mm Hg  ----------  Right  ventricle                            Value        Reference TAPSE                                      24.4  mm     ---------- RV pressure, S, DP                  (H)    32    mm Hg  <=30 RV s&', lateral, S                          10.1  cm/s   ----------  Legend: (L)  and  (H)  mark values outside specified reference range.  ------------------------------------------------------------------- Prepared and Electronically Authenticated by  Jesse Woods, M.D. 2019-04-24T16:11:28          ______________________________________________________________________________________________       Current Reported Medications:.    Active Medications[1]  Physical Exam:    VS:  BP 112/74   Pulse 67   Ht 5'  8 (1.727 m)   Wt 172 lb 12.8 oz (78.4 kg)   SpO2 98%   BMI 26.27 kg/m    Wt Readings from Last 3 Encounters:  03/30/24 172 lb 12.8 oz (78.4 kg)  11/05/23 171 lb (77.6 kg)  10/23/23 171 lb 6.4 oz (77.7 kg)    GEN: Well nourished, well developed in no acute distress NECK: No JVD; No carotid bruits CARDIAC: RRR, no murmurs, rubs, gallops RESPIRATORY:  Clear to auscultation without rales, wheezing or rhonchi  ABDOMEN: Soft, non-tender, non-distended EXTREMITIES:  No edema; No acute deformity     Asessement and Plan:.    CAD: LHC on 12/22/2014 showed 90% proximal to mid RCA lesion treated with 3.0 x 32 mm Synergy DES.  Patient had residual chronic total occlusion of mid left circumflex with left to left collaterals. Stable with no anginal symptoms. No indication for ischemic evaluation.  Heart healthy diet and regular cardiovascular exercise encouraged.  Heart healthy diet and regular cardiovascular exercise encouraged.  Reviewed ED precautions.  Continue aspirin  81 mg daily, carvedilol  12.5 mg twice daily, Plavix  75 mg daily, Farxiga  10 mg daily, Zetia  10 mg daily, Lasix  20 mg as needed, Imdur  30 mg daily, lisinopril  40 mg daily, Crestor  40 mg daily and spironolactone  12.5 mg daily.  Check CBC and BMET.   Chronic diastolic CHF/ischemic CM, history of heart failure with recovered EF: EF in 2016 was as low as 15% in 05/2014, improved to 35 to 40% on follow-up echo in 11/2014.  Most recent echo in April 2019 showed normalization of EF at 60 to 65%. Today he denies shortness of breath, lower extremity edema, thumping or PND.  He denies any significant weight gain. He appears euvolemic and well compensated on exam. Continue lisinopril  40 mg daily, carvedilol  12.5 mg twice daily, Farxiga  10 mg daily, Lasix  as needed, Imdur  30 mg daily and spironolactone  12.5 mg daily. Check CBC and BMET.   Hypertension: Blood pressure today 112/74.  Continue current antihypertensive regimen.  Hyperlipidemia: Last lipid profile indicated total cholesterol 81, HDL 33, triglycerides 21.2 and LDL 26.  Continue Crestor  4 mg daily and Zetia  10 mg daily.  CKD: Last creatinine 1.92 in 10/2023.  Patient is followed by nephrology.   Disposition: F/u with Dr. Harvest in one year or sooner if needed.   Signed, Bari Leib D Rafaella Kole, NP      [1]  Current Meds  Medication Sig   Alcohol Swabs (B-D SINGLE USE SWABS REGULAR) PADS Use as directed once daily E11.9   aspirin  81 MG tablet Take 81 mg by mouth daily.   Blood Glucose Calibration (TRUE METRIX LEVEL 3) High SOLN Use as directed once daily as needed E11.9   Blood Glucose Monitoring Suppl (TRUE METRIX AIR GLUCOSE METER) DEVI Use as directed once daily E11.9   carvedilol  (COREG ) 12.5 MG tablet TAKE 1 TABLET TWICE DAILY   clopidogrel  (PLAVIX ) 75 MG tablet TAKE 1 TABLET(75 MG) BY MOUTH DAILY   COMIRNATY syringe Inject 0.3 mLs into the muscle once.   ezetimibe  (ZETIA ) 10 MG tablet Take 1 tablet (10 mg total) by mouth daily.   FARXIGA  10 MG TABS tablet Take 1 tablet (10 mg total) by mouth daily.   furosemide  (LASIX ) 20 MG tablet Take 1 tablet (20 mg total) by mouth as needed for edema.   glucose blood (TRUE METRIX BLOOD GLUCOSE TEST) test strip Use as instructed    lisinopril  (ZESTRIL ) 40 MG tablet TAKE 1 TABLET EVERY DAY   LOKELMA 10 g  Woods packet SMARTSIG:1 Packet(s) By Mouth Every Other Day   meloxicam  (MOBIC ) 15 MG tablet Take 1 tablet (15 mg total) by mouth daily as needed.   metFORMIN  (GLUCOPHAGE ) 1000 MG tablet TAKE 1 TABLET(1000 MG) BY MOUTH TWICE DAILY WITH A MEAL   Multiple Vitamins-Minerals (MULTIVITAMIN WITH MINERALS) tablet Take 1 tablet by mouth daily.   nitroGLYCERIN  (NITROSTAT ) 0.4 MG SL tablet Place 1 tablet (0.4 mg total) under the tongue every 5 (five) minutes as needed for chest pain (x 3 tabs).   rosuvastatin  (CRESTOR ) 40 MG tablet TAKE 1 TABLET(40 MG) BY MOUTH DAILY   spironolactone  (ALDACTONE ) 25 MG tablet TAKE 1/2 TABLET(12.5 MG) BY MOUTH DAILY.   tetrahydrozoline-zinc (VISINE-AC) 0.05-0.25 % ophthalmic solution Place 1 drop into both eyes daily as needed (allergy irritation.).   TRUEplus Lancets 30G MISC Use as directed once daily E11.9   VELTASSA 8.4 g packet Take 8.4 g by mouth daily.   [DISCONTINUED] isosorbide  mononitrate (IMDUR ) 30 MG 24 hr tablet TAKE 1 TABLET(30 MG) BY MOUTH DAILY PATIENT MUST MAKE AN APPOINTMENT IN ORDER TO RECEIVE ADDITIONAL REFILLS, FIRST ATTEMPT.   "

## 2024-03-30 ENCOUNTER — Ambulatory Visit: Attending: Cardiology | Admitting: Cardiology

## 2024-03-30 ENCOUNTER — Encounter: Payer: Self-pay | Admitting: Cardiology

## 2024-03-30 VITALS — BP 112/74 | HR 67 | Ht 68.0 in | Wt 172.8 lb

## 2024-03-30 DIAGNOSIS — I2510 Atherosclerotic heart disease of native coronary artery without angina pectoris: Secondary | ICD-10-CM

## 2024-03-30 DIAGNOSIS — E785 Hyperlipidemia, unspecified: Secondary | ICD-10-CM

## 2024-03-30 DIAGNOSIS — I10 Essential (primary) hypertension: Secondary | ICD-10-CM | POA: Diagnosis not present

## 2024-03-30 DIAGNOSIS — I5032 Chronic diastolic (congestive) heart failure: Secondary | ICD-10-CM | POA: Diagnosis not present

## 2024-03-30 DIAGNOSIS — I255 Ischemic cardiomyopathy: Secondary | ICD-10-CM

## 2024-03-30 MED ORDER — ISOSORBIDE MONONITRATE ER 30 MG PO TB24: ORAL_TABLET | ORAL | 3 refills | Status: AC

## 2024-03-30 NOTE — Patient Instructions (Signed)
 Medication Instructions:  Your physician recommends that you continue on your current medications as directed. Please refer to the Current Medication list given to you today.  *If you need a refill on your cardiac medications before your next appointment, please call your pharmacy*  Lab Work: TODAY: CBC, BMET  If you have labs (blood work) drawn today and your tests are completely normal, you will receive your results only by: MyChart Message (if you have MyChart) OR A paper copy in the mail If you have any lab test that is abnormal or we need to change your treatment, we will call you to review the results.  Testing/Procedures: NONE  Follow-Up: At [facility], you and your health needs are our priority.  As part of our continuing mission to provide you with exceptional heart care, our providers are all part of one team.  This team includes your primary Cardiologist (physician) and Advanced Practice Providers or APPs (Physician Assistants and Nurse Practitioners) who all work together to provide you with the care you need, when you need it.  Your next appointment:   1 year(s)  Provider:   Wilbert Bihari, MD   We recommend signing up for the patient portal called MyChart.  Sign up information is provided on this After Visit Summary.  MyChart is used to connect with patients for Virtual Visits (Telemedicine).  Patients are able to view lab/test results, encounter notes, upcoming appointments, etc.  Non-urgent messages can be sent to your provider as well.   To learn more about what you can do with MyChart, go to [email or web address].

## 2024-03-31 ENCOUNTER — Ambulatory Visit: Payer: Self-pay | Admitting: Cardiology

## 2024-03-31 LAB — BASIC METABOLIC PANEL WITH GFR
BUN/Creatinine Ratio: 6 — ABNORMAL LOW (ref 10–24)
BUN: 10 mg/dL (ref 8–27)
CO2: 21 mmol/L (ref 20–29)
Calcium: 9.6 mg/dL (ref 8.6–10.2)
Chloride: 106 mmol/L (ref 96–106)
Creatinine, Ser: 1.74 mg/dL — ABNORMAL HIGH (ref 0.76–1.27)
Glucose: 104 mg/dL — ABNORMAL HIGH (ref 70–99)
Potassium: 5 mmol/L (ref 3.5–5.2)
Sodium: 142 mmol/L (ref 134–144)
eGFR: 40 mL/min/1.73 — ABNORMAL LOW

## 2024-03-31 LAB — CBC
Hematocrit: 38.4 % (ref 37.5–51.0)
Hemoglobin: 11.8 g/dL — ABNORMAL LOW (ref 13.0–17.7)
MCH: 28.7 pg (ref 26.6–33.0)
MCHC: 30.7 g/dL — ABNORMAL LOW (ref 31.5–35.7)
MCV: 93 fL (ref 79–97)
Platelets: 242 x10E3/uL (ref 150–450)
RBC: 4.11 x10E6/uL — ABNORMAL LOW (ref 4.14–5.80)
RDW: 12.6 % (ref 11.6–15.4)
WBC: 6.3 x10E3/uL (ref 3.4–10.8)

## 2024-04-09 ENCOUNTER — Ambulatory Visit

## 2024-04-09 VITALS — BP 110/70 | HR 74 | Ht 67.25 in | Wt 175.4 lb

## 2024-04-09 DIAGNOSIS — Z0000 Encounter for general adult medical examination without abnormal findings: Secondary | ICD-10-CM

## 2024-04-09 NOTE — Patient Instructions (Addendum)
 [name],  Thank you for taking the time for your Medicare Wellness Visit. I appreciate your continued commitment to your health goals. Please review the care plan we discussed, and feel free to reach out if I can assist you further.  Please note that Annual Wellness Visits do not include a physical exam. Some assessments may be limited, especially if the visit was conducted virtually. If needed, we may recommend an in-person follow-up with your provider.  Ongoing Care Seeing your primary care provider every 3 to 6 months helps us  monitor your health and provide consistent, personalized care. Last office visit on 11/05/2023.  You are due for a diabetic eye exam.  Each day, aim for 6 glasses of water, plenty of protein in your diet and try to get up and walk/ stretch every hour for 5-10 minutes at a time.    Referrals If a referral was made during today's visit and you haven't received any updates within two weeks, please contact the referred provider directly to check on the status.  You are due for a colonoscopy, please see below.   [facility] Located in: [facility] [address] Address: [address], [place], [place] [address] Phone: [phone]  Recommended Screenings:  Health Maintenance  Topic Date Due   Colon Cancer Screening  06/06/2021   Eye exam for diabetics  07/10/2022   Flu Shot  11/07/2023   Medicare Annual Wellness Visit  11/26/2023   Hemoglobin A1C  04/25/2024   Yearly kidney health urinalysis for diabetes  10/22/2024   Complete foot exam   10/22/2024   Yearly kidney function blood test for diabetes  03/30/2025   DTaP/Tdap/Td vaccine (3 - Td or Tdap) 06/22/2030   Pneumococcal Vaccine for age over 39  Completed   Hepatitis C Screening  Completed   Zoster (Shingles) Vaccine  Completed   Meningitis B Vaccine  Aged Out   COVID-19 Vaccine  Discontinued       04/09/2024    2:08 PM  Advanced Directives  Does  Patient Have a Medical Advance Directive? No    Vision: Annual vision screenings are recommended for early detection of glaucoma, cataracts, and diabetic retinopathy. These exams can also reveal signs of chronic conditions such as diabetes and high blood pressure.  Dental: Annual dental screenings help detect early signs of oral cancer, gum disease, and other conditions linked to overall health, including heart disease and diabetes.  Please see the attached documents for additional preventive care recommendations.

## 2024-04-09 NOTE — Progress Notes (Signed)
 "  Chief Complaint  Patient presents with   Medicare Wellness     Subjective:   Jesse Woods is a 74 y.o. male who presents for a Medicare Annual Wellness Visit.  Visit info / Clinical Intake: Medicare Wellness Visit Type:: Subsequent Annual Wellness Visit Persons participating in visit and providing information:: patient Medicare Wellness Visit Mode:: In-person (required for WTM) Interpreter Needed?: No Pre-visit prep was completed: yes AWV questionnaire completed by patient prior to visit?: no Living arrangements:: with family/others Patient's Overall Health Status Rating: very good Typical amount of pain: none Does pain affect daily life?: no Are you currently prescribed opioids?: no  Dietary Habits and Nutritional Risks How many meals a day?: 2 Eats fruit and vegetables daily?: (!) no Most meals are obtained by: eating out In the last 2 weeks, have you had any of the following?: none Diabetic:: (!) yes Any non-healing wounds?: no How often do you check your BS?: 0 (Patient never got device that was [facility] to [facility]) Would you like to be referred to a Nutritionist or for Diabetic Management? : no  Functional Status Activities of Daily Living (to include ambulation/medication): Independent Ambulation: Independent with device- listed below Home Assistive Devices/Equipment: Eyeglasses Medication Administration: Independent Home Management (perform basic housework or laundry): Independent Manage your own finances?: yes Primary transportation is: family / friends (patient stated that he can still drive  but does not have a car) Concerns about vision?: no *vision screening is required for WTM* Concerns about hearing?: (!) yes (left ear-some hissing sounds-per pt) Uses hearing aids?: no Hear whispered voice?: yes  Fall Screening Falls in the past year?: 0 Number of falls in past year: 0 Was there an injury with Fall?: 0 Fall Risk Category Calculator: 0 Patient Fall  Risk Level: Low Fall Risk  Fall Risk Patient at Risk for Falls Due to: No Fall Risks Fall risk Follow up: Falls evaluation completed; Falls prevention discussed  Home and Transportation Safety: All rugs have non-skid backing?: N/A, no rugs All stairs or steps have railings?: N/A, no stairs Grab bars in the bathtub or shower?: (!) no Have non-skid surface in bathtub or shower?: (!) no Good home lighting?: yes Regular seat belt use?: yes Hospital stays in the last year:: no  Cognitive Assessment Difficulty concentrating, remembering, or making decisions? : no Will 6CIT or Mini Cog be Completed: no 6CIT or Mini Cog Declined: patient alert, oriented, able to answer questions appropriately and recall recent events  Advance Directives (For Healthcare) Does Patient Have a Medical Advance Directive?: No  Reviewed/Updated  Reviewed/Updated: Reviewed All (Medical, Surgical, Family, Medications, Allergies, Care Teams, Patient Goals)    Allergies (verified) Patient has no known allergies.   Current Medications (verified) Outpatient Encounter Medications as of 04/09/2024  Medication Sig   aspirin  81 MG tablet Take 81 mg by mouth daily.   carvedilol  (COREG ) 12.5 MG tablet TAKE 1 TABLET TWICE DAILY   clopidogrel  (PLAVIX ) 75 MG tablet TAKE 1 TABLET(75 MG) BY MOUTH DAILY   ezetimibe  (ZETIA ) 10 MG tablet Take 1 tablet (10 mg total) by mouth daily.   FARXIGA  10 MG TABS tablet Take 1 tablet (10 mg total) by mouth daily.   isosorbide  mononitrate (IMDUR ) 30 MG 24 hr tablet TAKE 1 TABLET(30 MG) BY MOUTH DAILY   lisinopril  (ZESTRIL ) 40 MG tablet TAKE 1 TABLET EVERY DAY   meloxicam  (MOBIC ) 15 MG tablet Take 1 tablet (15 mg total) by mouth daily as needed.   metFORMIN  (GLUCOPHAGE ) 1000 MG tablet  TAKE 1 TABLET(1000 MG) BY MOUTH TWICE DAILY WITH A MEAL   Multiple Vitamins-Minerals (MULTIVITAMIN WITH MINERALS) tablet Take 1 tablet by mouth daily.   nitroGLYCERIN  (NITROSTAT ) 0.4 MG SL tablet Place 1  tablet (0.4 mg total) under the tongue every 5 (five) minutes as needed for chest pain (x 3 tabs).   rosuvastatin  (CRESTOR ) 40 MG tablet TAKE 1 TABLET(40 MG) BY MOUTH DAILY   spironolactone  (ALDACTONE ) 25 MG tablet TAKE 1/2 TABLET(12.5 MG) BY MOUTH DAILY.   tetrahydrozoline-zinc (VISINE-AC) 0.05-0.25 % ophthalmic solution Place 1 drop into both eyes daily as needed (allergy irritation.).   TRUEplus Lancets 30G MISC Use as directed once daily E11.9   VELTASSA 8.4 g packet Take 8.4 g by mouth daily.   Alcohol Swabs (B-D SINGLE USE SWABS REGULAR) PADS Use as directed once daily E11.9   Blood Glucose Calibration (TRUE METRIX LEVEL 3) High SOLN Use as directed once daily as needed E11.9   Blood Glucose Monitoring Suppl (TRUE METRIX AIR GLUCOSE METER) DEVI Use as directed once daily E11.9   COMIRNATY syringe Inject 0.3 mLs into the muscle once.   furosemide  (LASIX ) 20 MG tablet Take 1 tablet (20 mg total) by mouth as needed for edema.   glucose blood (TRUE METRIX BLOOD GLUCOSE TEST) test strip Use as instructed   LOKELMA 10 g PACK packet SMARTSIG:1 Packet(s) By Mouth Every Other Day (Patient not taking: Reported on 04/09/2024)   No facility-administered encounter medications on file as of 04/09/2024.    History: Past Medical History:  Diagnosis Date   Arthritis    back and left hip (12/22/2014)   Chronic diastolic (congestive) heart failure (HCC)    Coronary artery disease 01/2015   chronic total occlusion of mid LCx and severely diseased RCA on previous cath in February 2016. EF at that time was 15%. He was cleared by neurosurgery for DAPT and then was seen by Dr. Dann to proceed with PCI of RCA.  LHC 12/22/2014 showed 90% proximal to mid RCA lesion that treated with 3.0 x 32 mm Synergy DES   DCM (dilated cardiomyopathy) (HCC) 06/22/2014   EF was 15% but now 35-40% after PCI. Echo 2019 with EF 60-65% with G1DD   Diabetes mellitus, type II (HCC)    Hyperlipidemia    Hypertension     Subarachnoid hemorrhage (HCC) 1998   from HTN   Past Surgical History:  Procedure Laterality Date   BRAIN SURGERY  1998   right frontal ventriculotomy with subsequent ventriculo-abfdominal shunt due to [facility]   CARDIAC CATHETERIZATION N/A 12/22/2014   Procedure: Coronary Stent Intervention;  Surgeon: Candyce GORMAN Dann, MD;  Location: [facility] INVASIVE CV LAB;  Service: Cardiovascular;  Laterality: N/A;   CARDIAC CATHETERIZATION  12/22/2014   Procedure: Left Heart Cath and Coronary Angiography;  Surgeon: Candyce GORMAN Dann, MD;  Location: [facility] INVASIVE CV LAB;  Service: Cardiovascular;;   CARDIAC CATHETERIZATION  06/24/2014   CORONARY ANGIOPLASTY     CSF SHUNT  1998   INGUINAL HERNIA REPAIR Right 1954   LEFT HEART CATHETERIZATION WITH CORONARY ANGIOGRAM N/A 06/24/2014   Procedure: LEFT HEART CATHETERIZATION WITH CORONARY ANGIOGRAM;  Surgeon: Candyce GORMAN Dann, MD;  Location: [facility] CATH LAB;  Service: Cardiovascular;  Laterality: N/A;   Family History  Problem Relation Age of Onset   Cancer Mother        pancreatic   Pancreatic cancer Mother    Diabetes Father    Hypertension Father    Cancer Father  prostate cancer   Prostate cancer Father    Cancer Brother        prostate cancer - cause of death   Cancer Cousin        prostate cancer   Prostate cancer Paternal Uncle    Social History   Occupational History   Occupation: retired  Tobacco Use   Smoking status: Never   Smokeless tobacco: Never  Vaping Use   Vaping status: Never Used  Substance and Sexual Activity   Alcohol use: Yes    Comment: 12/22/2014 no alcohol since 1998   Drug use: No   Sexual activity: Not Currently    Partners: Female   Tobacco Counseling Counseling given: Not Answered  SDOH Screenings   Food Insecurity: No Food Insecurity (04/09/2024)  Housing: Unknown (04/09/2024)  Transportation Needs: No Transportation Needs (04/09/2024)  Utilities: At Risk (04/09/2024)  Alcohol Screen: Low Risk (11/26/2021)   Depression (PHQ2-9): Low Risk (04/09/2024)  Financial Resource Strain: Low Risk (11/26/2022)  Physical Activity: Inactive (04/09/2024)  Social Connections: Unknown (04/09/2024)  Stress: No Stress Concern Present (04/09/2024)  Tobacco Use: Low Risk (04/09/2024)  Health Literacy: Adequate Health Literacy (04/09/2024)   See flowsheets for full screening details  Depression Screen PHQ 2 & 9 Depression Scale- Over the past 2 weeks, how often have you been bothered by any of the following problems? Little interest or pleasure in doing things: 0 Feeling down, depressed, or hopeless (PHQ Adolescent also includes...irritable): 0 PHQ-2 Total Score: 0 Trouble falling or staying asleep, or sleeping too much: 0 Feeling tired or having little energy: 0 Poor appetite or overeating (PHQ Adolescent also includes...weight loss): 0 Feeling bad about yourself - or that you are a failure or have let yourself or your family down: 0 Trouble concentrating on things, such as reading the newspaper or watching television (PHQ Adolescent also includes...like school work): 0 Moving or speaking so slowly that other people could have noticed. Or the opposite - being so fidgety or restless that you have been moving around a lot more than usual: 0 Thoughts that you would be better off dead, or of hurting yourself in some way: 0 PHQ-9 Total Score: 0 If you checked off any problems, how difficult have these problems made it for you to do your work, take care of things at home, or get along with other people?: Not difficult at all  Depression Treatment Depression Interventions/Treatment : EYV7-0 Score <4 Follow-up Not Indicated     Goals Addressed   None          Objective:    Today's Vitals   04/09/24 1402  BP: 110/70  Pulse: 74  SpO2: 99%  Weight: 175 lb 6.4 oz (79.6 kg)  Height: 5' 7.25 (1.708 m)   Body mass index is 27.27 kg/m.  Hearing/Vision screen Hearing Screening - Comments:: Has some hissing in lt  ear/would like a hearing test Vision Screening - Comments:: Wears eyeglasses for reading/Dr. Elihu UTD/up coming appt Immunizations and Health Maintenance Health Maintenance  Topic Date Due   Colonoscopy  06/06/2021   OPHTHALMOLOGY EXAM  07/10/2022   Influenza Vaccine  11/07/2023   HEMOGLOBIN A1C  04/25/2024   Diabetic kidney evaluation - Urine ACR  10/22/2024   FOOT EXAM  10/22/2024   Diabetic kidney evaluation - eGFR measurement  03/30/2025   Medicare Annual Wellness (AWV)  04/09/2025   DTaP/Tdap/Td (3 - Td or Tdap) 06/22/2030   Pneumococcal Vaccine: 50+ Years  Completed   Hepatitis C Screening  Completed  Zoster Vaccines- Shingrix  Completed   Meningococcal B Vaccine  Aged Out   COVID-19 Vaccine  Discontinued        Assessment/Plan:  This is a routine wellness examination for Brownlee.  Patient Care Team: Norleen Lynwood ORN, MD as PCP - General (Internal Medicine) Shlomo Wilbert SAUNDERS, MD as PCP - Cardiology (Cardiology) Octavia Charleston, MD (Ophthalmology)  I have personally reviewed and noted the following in the patients chart:   Medical and social history Use of alcohol, tobacco or illicit drugs  Current medications and supplements including opioid prescriptions. Functional ability and status Nutritional status Physical activity Advanced directives List of other physicians Hospitalizations, surgeries, and ER visits in previous 12 months Vitals Screenings to include cognitive, depression, and falls Referrals and appointments  No orders of the defined types were placed in this encounter.  In addition, I have reviewed and discussed with patient certain preventive protocols, quality metrics, and best practice recommendations. A written personalized care plan for preventive services as well as general preventive health recommendations were provided to patient.   Hobie Kohles L Ellise Kovack, CMA   04/09/2024   Return in 1 year (on 04/09/2025).  After Visit Summary: (In Person-Printed)  AVS printed and given to the patient  Nurse Notes: Patient is due for a colonoscopy and order was placed.  Patient is due for diabetic eye exam.  Patient stated that he is scheduled for an appointment this month.   Patient requested a refill on Farxiga  10 mg daily (Please Advise).  He stated that he has not received the glucose meter that was sent into pharmacy during his last office visit therefore, he has not been checking his blood sugars at home.  Patient also stated that he would like to have a hearing test due to hissing sound in left ear, (Please Advise).  He had no other concerns to address today.   "

## 2024-04-16 ENCOUNTER — Other Ambulatory Visit: Payer: Self-pay

## 2024-04-16 MED ORDER — EZETIMIBE 10 MG PO TABS: 10.0000 mg | ORAL_TABLET | Freq: Every day | ORAL | 3 refills | Status: AC
